# Patient Record
Sex: Male | Born: 1950
Health system: Southern US, Community
[De-identification: ages and names within clinical notes are randomized; demographics above are authoritative.]

## PROBLEM LIST (undated history)

## (undated) DIAGNOSIS — I639 Cerebral infarction, unspecified: Secondary | ICD-10-CM

## (undated) DIAGNOSIS — A048 Other specified bacterial intestinal infections: Secondary | ICD-10-CM

## (undated) DIAGNOSIS — F32A Depression, unspecified: Secondary | ICD-10-CM

## (undated) DIAGNOSIS — M1990 Unspecified osteoarthritis, unspecified site: Secondary | ICD-10-CM

## (undated) DIAGNOSIS — B1920 Unspecified viral hepatitis C without hepatic coma: Secondary | ICD-10-CM

## (undated) DIAGNOSIS — R0602 Shortness of breath: Secondary | ICD-10-CM

## (undated) DIAGNOSIS — K219 Gastro-esophageal reflux disease without esophagitis: Secondary | ICD-10-CM

## (undated) DIAGNOSIS — F329 Major depressive disorder, single episode, unspecified: Secondary | ICD-10-CM

## (undated) DIAGNOSIS — I10 Essential (primary) hypertension: Secondary | ICD-10-CM

## (undated) HISTORY — PX: OTHER SURGICAL HISTORY: SHX169

## (undated) HISTORY — DX: Unspecified osteoarthritis, unspecified site: M19.90

## (undated) HISTORY — DX: Unspecified viral hepatitis C without hepatic coma: B19.20

## (undated) HISTORY — DX: Other specified bacterial intestinal infections: A04.8

## (undated) HISTORY — DX: Gastro-esophageal reflux disease without esophagitis: K21.9

## (undated) HISTORY — DX: Major depressive disorder, single episode, unspecified: F32.9

## (undated) HISTORY — PX: ROTATOR CUFF REPAIR: SHX139

## (undated) HISTORY — DX: Depression, unspecified: F32.A

## (undated) HISTORY — DX: Essential (primary) hypertension: I10

---

## 2002-03-23 ENCOUNTER — Emergency Department (HOSPITAL_COMMUNITY): Admission: EM | Admit: 2002-03-23 | Discharge: 2002-03-23 | Payer: Self-pay | Admitting: Emergency Medicine

## 2002-03-23 ENCOUNTER — Encounter: Payer: Self-pay | Admitting: Emergency Medicine

## 2002-08-29 ENCOUNTER — Emergency Department (HOSPITAL_COMMUNITY): Admission: EM | Admit: 2002-08-29 | Discharge: 2002-08-29 | Payer: Self-pay | Admitting: Emergency Medicine

## 2002-10-04 ENCOUNTER — Encounter: Admission: RE | Admit: 2002-10-04 | Discharge: 2002-10-04 | Payer: Self-pay | Admitting: Internal Medicine

## 2002-11-09 ENCOUNTER — Encounter: Admission: RE | Admit: 2002-11-09 | Discharge: 2002-11-09 | Payer: Self-pay | Admitting: Internal Medicine

## 2002-12-15 ENCOUNTER — Encounter: Admission: RE | Admit: 2002-12-15 | Discharge: 2002-12-15 | Payer: Self-pay | Admitting: Internal Medicine

## 2003-12-13 ENCOUNTER — Ambulatory Visit: Payer: Self-pay | Admitting: Internal Medicine

## 2003-12-17 ENCOUNTER — Emergency Department (HOSPITAL_COMMUNITY): Admission: EM | Admit: 2003-12-17 | Discharge: 2003-12-17 | Payer: Self-pay | Admitting: Family Medicine

## 2003-12-21 ENCOUNTER — Ambulatory Visit (HOSPITAL_COMMUNITY): Admission: RE | Admit: 2003-12-21 | Discharge: 2003-12-21 | Payer: Self-pay | Admitting: Orthopaedic Surgery

## 2004-11-28 ENCOUNTER — Ambulatory Visit: Payer: Self-pay | Admitting: Internal Medicine

## 2004-12-20 ENCOUNTER — Emergency Department (HOSPITAL_COMMUNITY): Admission: EM | Admit: 2004-12-20 | Discharge: 2004-12-20 | Payer: Self-pay | Admitting: Emergency Medicine

## 2005-06-06 ENCOUNTER — Ambulatory Visit: Payer: Self-pay | Admitting: Internal Medicine

## 2005-06-27 ENCOUNTER — Ambulatory Visit: Payer: Self-pay | Admitting: Internal Medicine

## 2005-07-10 ENCOUNTER — Ambulatory Visit: Payer: Self-pay | Admitting: Internal Medicine

## 2006-01-02 DIAGNOSIS — B1710 Acute hepatitis C without hepatic coma: Secondary | ICD-10-CM | POA: Insufficient documentation

## 2006-01-02 DIAGNOSIS — K219 Gastro-esophageal reflux disease without esophagitis: Secondary | ICD-10-CM | POA: Insufficient documentation

## 2006-01-02 DIAGNOSIS — I10 Essential (primary) hypertension: Secondary | ICD-10-CM | POA: Insufficient documentation

## 2006-01-02 DIAGNOSIS — M1990 Unspecified osteoarthritis, unspecified site: Secondary | ICD-10-CM | POA: Insufficient documentation

## 2006-02-24 DIAGNOSIS — R809 Proteinuria, unspecified: Secondary | ICD-10-CM | POA: Insufficient documentation

## 2006-02-24 DIAGNOSIS — F329 Major depressive disorder, single episode, unspecified: Secondary | ICD-10-CM

## 2006-03-16 ENCOUNTER — Telehealth: Payer: Self-pay | Admitting: *Deleted

## 2006-03-24 ENCOUNTER — Ambulatory Visit: Payer: Self-pay | Admitting: Internal Medicine

## 2006-03-24 ENCOUNTER — Encounter (INDEPENDENT_AMBULATORY_CARE_PROVIDER_SITE_OTHER): Payer: Self-pay | Admitting: Infectious Diseases

## 2006-03-24 DIAGNOSIS — J309 Allergic rhinitis, unspecified: Secondary | ICD-10-CM | POA: Insufficient documentation

## 2006-03-24 LAB — CONVERTED CEMR LAB
ALT: 68 units/L — ABNORMAL HIGH (ref 0–53)
AST: 50 units/L — ABNORMAL HIGH (ref 0–37)
Albumin: 4.3 g/dL (ref 3.5–5.2)
Alkaline Phosphatase: 66 units/L (ref 39–117)
BUN: 24 mg/dL — ABNORMAL HIGH (ref 6–23)
CO2: 23 meq/L (ref 19–32)
Calcium: 9.5 mg/dL (ref 8.4–10.5)
Chloride: 103 meq/L (ref 96–112)
Cholesterol: 159 mg/dL (ref 0–200)
Creatinine, Ser: 1.36 mg/dL (ref 0.40–1.50)
Glucose, Bld: 109 mg/dL — ABNORMAL HIGH (ref 70–99)
HCT: 49.9 % (ref 39.0–52.0)
HDL: 36 mg/dL — ABNORMAL LOW (ref >39)
Hemoglobin: 17.5 g/dL — ABNORMAL HIGH (ref 13.0–17.0)
LDL Cholesterol: 97 mg/dL (ref 0–99)
MCHC: 35.1 g/dL (ref 30.0–36.0)
MCV: 89.7 fL (ref 78.0–100.0)
Platelets: 231 10*3/uL (ref 150–400)
Potassium: 3.7 meq/L (ref 3.5–5.3)
RBC: 5.56 M/uL (ref 4.22–5.81)
RDW: 13.3 % (ref 11.5–14.0)
Sodium: 139 meq/L (ref 135–145)
Total Bilirubin: 1 mg/dL (ref 0.3–1.2)
Total CHOL/HDL Ratio: 4.4
Total Protein: 7.6 g/dL (ref 6.0–8.3)
Triglycerides: 130 mg/dL (ref <150)
VLDL: 26 mg/dL (ref 0–40)
WBC: 4.2 10*3/uL (ref 4.0–10.5)

## 2006-04-01 ENCOUNTER — Ambulatory Visit: Payer: Self-pay | Admitting: Internal Medicine

## 2006-04-06 ENCOUNTER — Ambulatory Visit: Payer: Self-pay | Admitting: Hospitalist

## 2006-09-30 ENCOUNTER — Telehealth (INDEPENDENT_AMBULATORY_CARE_PROVIDER_SITE_OTHER): Payer: Self-pay | Admitting: Pharmacy Technician

## 2006-10-19 ENCOUNTER — Ambulatory Visit: Payer: Self-pay | Admitting: Internal Medicine

## 2006-10-20 DIAGNOSIS — F528 Other sexual dysfunction not due to a substance or known physiological condition: Secondary | ICD-10-CM | POA: Insufficient documentation

## 2006-10-23 ENCOUNTER — Encounter (INDEPENDENT_AMBULATORY_CARE_PROVIDER_SITE_OTHER): Payer: Self-pay | Admitting: Infectious Diseases

## 2006-10-23 ENCOUNTER — Ambulatory Visit: Payer: Self-pay | Admitting: Internal Medicine

## 2006-10-23 LAB — CONVERTED CEMR LAB
BUN: 20 mg/dL (ref 6–23)
CO2: 23 meq/L (ref 19–32)
Calcium: 9.2 mg/dL (ref 8.4–10.5)
Chloride: 103 meq/L (ref 96–112)
Cholesterol: 159 mg/dL (ref 0–200)
Creatinine, Ser: 1.44 mg/dL (ref 0.40–1.50)
Glucose, Bld: 96 mg/dL (ref 70–99)
HDL: 40 mg/dL (ref >39)
LDL Cholesterol: 95 mg/dL (ref 0–99)
Potassium: 3.6 meq/L (ref 3.5–5.3)
Sodium: 139 meq/L (ref 135–145)
Total CHOL/HDL Ratio: 4
Triglycerides: 122 mg/dL (ref <150)
VLDL: 24 mg/dL (ref 0–40)

## 2007-04-07 ENCOUNTER — Encounter (INDEPENDENT_AMBULATORY_CARE_PROVIDER_SITE_OTHER): Payer: Self-pay | Admitting: Infectious Diseases

## 2007-04-07 ENCOUNTER — Ambulatory Visit: Payer: Self-pay | Admitting: Internal Medicine

## 2007-04-13 LAB — CONVERTED CEMR LAB
BUN: 18 mg/dL (ref 6–23)
CO2: 25 meq/L (ref 19–32)
Calcium: 9.4 mg/dL (ref 8.4–10.5)
Chloride: 104 meq/L (ref 96–112)
Creatinine, Ser: 1.3 mg/dL (ref 0.40–1.50)
Glucose, Bld: 94 mg/dL (ref 70–99)
Potassium: 3.1 meq/L — ABNORMAL LOW (ref 3.5–5.3)
Sodium: 141 meq/L (ref 135–145)

## 2007-04-14 ENCOUNTER — Telehealth: Payer: Self-pay | Admitting: *Deleted

## 2007-04-15 ENCOUNTER — Telehealth (INDEPENDENT_AMBULATORY_CARE_PROVIDER_SITE_OTHER): Payer: Self-pay | Admitting: Infectious Diseases

## 2007-04-30 ENCOUNTER — Encounter (INDEPENDENT_AMBULATORY_CARE_PROVIDER_SITE_OTHER): Payer: Self-pay | Admitting: Infectious Diseases

## 2007-04-30 ENCOUNTER — Ambulatory Visit: Payer: Self-pay | Admitting: Hospitalist

## 2007-04-30 ENCOUNTER — Telehealth (INDEPENDENT_AMBULATORY_CARE_PROVIDER_SITE_OTHER): Payer: Self-pay | Admitting: Infectious Diseases

## 2007-04-30 LAB — CONVERTED CEMR LAB
BUN: 15 mg/dL (ref 6–23)
CO2: 22 meq/L (ref 19–32)
Calcium: 9.1 mg/dL (ref 8.4–10.5)
Chloride: 104 meq/L (ref 96–112)
Creatinine, Ser: 1.37 mg/dL (ref 0.40–1.50)
Glucose, Bld: 89 mg/dL (ref 70–99)
Potassium: 4.2 meq/L (ref 3.5–5.3)
Sodium: 141 meq/L (ref 135–145)

## 2007-09-28 ENCOUNTER — Ambulatory Visit: Payer: Self-pay | Admitting: *Deleted

## 2007-09-28 ENCOUNTER — Encounter (INDEPENDENT_AMBULATORY_CARE_PROVIDER_SITE_OTHER): Payer: Self-pay | Admitting: *Deleted

## 2007-09-29 ENCOUNTER — Telehealth: Payer: Self-pay | Admitting: *Deleted

## 2007-09-29 ENCOUNTER — Telehealth (INDEPENDENT_AMBULATORY_CARE_PROVIDER_SITE_OTHER): Payer: Self-pay | Admitting: *Deleted

## 2007-09-29 LAB — CONVERTED CEMR LAB
ALT: 45 units/L (ref 0–53)
AST: 40 units/L — ABNORMAL HIGH (ref 0–37)
Albumin: 3.9 g/dL (ref 3.5–5.2)
Alkaline Phosphatase: 56 units/L (ref 39–117)
BUN: 25 mg/dL — ABNORMAL HIGH (ref 6–23)
CO2: 28 meq/L (ref 19–32)
Calcium: 9.4 mg/dL (ref 8.4–10.5)
Chloride: 107 meq/L (ref 96–112)
Creatinine, Ser: 1.54 mg/dL — ABNORMAL HIGH (ref 0.40–1.50)
Glucose, Bld: 102 mg/dL — ABNORMAL HIGH (ref 70–99)
INR: 1 (ref 0.0–1.5)
Potassium: 3.3 meq/L — ABNORMAL LOW (ref 3.5–5.3)
Prothrombin Time: 13.1 s (ref 11.6–15.2)
Sodium: 141 meq/L (ref 135–145)
Total Bilirubin: 1.4 mg/dL — ABNORMAL HIGH (ref 0.3–1.2)
Total Protein: 7.1 g/dL (ref 6.0–8.3)

## 2007-09-30 LAB — CONVERTED CEMR LAB: HCV Quantitative: 3560000 intl units/mL — ABNORMAL HIGH (ref <43)

## 2007-10-27 ENCOUNTER — Encounter (INDEPENDENT_AMBULATORY_CARE_PROVIDER_SITE_OTHER): Payer: Self-pay | Admitting: *Deleted

## 2007-10-27 ENCOUNTER — Ambulatory Visit: Payer: Self-pay | Admitting: Internal Medicine

## 2007-10-27 LAB — CONVERTED CEMR LAB
BUN: 30 mg/dL — ABNORMAL HIGH (ref 6–23)
CO2: 24 meq/L (ref 19–32)
Calcium: 9.5 mg/dL (ref 8.4–10.5)
Chloride: 104 meq/L (ref 96–112)
Creatinine, Ser: 1.55 mg/dL — ABNORMAL HIGH (ref 0.40–1.50)
Glucose, Bld: 96 mg/dL (ref 70–99)
Potassium: 3.6 meq/L (ref 3.5–5.3)
Sodium: 141 meq/L (ref 135–145)

## 2007-10-28 ENCOUNTER — Telehealth (INDEPENDENT_AMBULATORY_CARE_PROVIDER_SITE_OTHER): Payer: Self-pay | Admitting: *Deleted

## 2007-11-05 ENCOUNTER — Telehealth: Payer: Self-pay | Admitting: *Deleted

## 2007-11-11 ENCOUNTER — Ambulatory Visit: Payer: Self-pay | Admitting: Gastroenterology

## 2007-11-25 ENCOUNTER — Ambulatory Visit: Payer: Self-pay | Admitting: Gastroenterology

## 2007-11-25 HISTORY — PX: COLONOSCOPY: SHX174

## 2007-11-25 LAB — HM COLONOSCOPY

## 2007-12-16 ENCOUNTER — Ambulatory Visit: Payer: Self-pay | Admitting: Internal Medicine

## 2007-12-16 ENCOUNTER — Encounter (INDEPENDENT_AMBULATORY_CARE_PROVIDER_SITE_OTHER): Payer: Self-pay | Admitting: *Deleted

## 2007-12-23 DIAGNOSIS — N184 Chronic kidney disease, stage 4 (severe): Secondary | ICD-10-CM | POA: Insufficient documentation

## 2007-12-27 ENCOUNTER — Telehealth (INDEPENDENT_AMBULATORY_CARE_PROVIDER_SITE_OTHER): Payer: Self-pay | Admitting: *Deleted

## 2007-12-27 LAB — CONVERTED CEMR LAB
BUN: 26 mg/dL — ABNORMAL HIGH (ref 6–23)
CO2: 25 meq/L (ref 19–32)
Calcium: 9.5 mg/dL (ref 8.4–10.5)
Chloride: 106 meq/L (ref 96–112)
Creatinine, Ser: 1.76 mg/dL — ABNORMAL HIGH (ref 0.40–1.50)
Creatinine, Urine: 145.6 mg/dL
Glucose, Bld: 123 mg/dL — ABNORMAL HIGH (ref 70–99)
Microalb Creat Ratio: 289.1 mg/g — ABNORMAL HIGH (ref 0.0–30.0)
Microalb, Ur: 42.1 mg/dL — ABNORMAL HIGH (ref 0.00–1.89)
Potassium: 3.4 meq/L — ABNORMAL LOW (ref 3.5–5.3)
Sodium: 139 meq/L (ref 135–145)

## 2007-12-29 ENCOUNTER — Encounter: Payer: Self-pay | Admitting: *Deleted

## 2008-01-04 ENCOUNTER — Ambulatory Visit (HOSPITAL_COMMUNITY): Admission: RE | Admit: 2008-01-04 | Discharge: 2008-01-04 | Payer: Self-pay | Admitting: Internal Medicine

## 2008-01-04 ENCOUNTER — Encounter (INDEPENDENT_AMBULATORY_CARE_PROVIDER_SITE_OTHER): Payer: Self-pay | Admitting: *Deleted

## 2008-01-04 ENCOUNTER — Ambulatory Visit: Payer: Self-pay | Admitting: Internal Medicine

## 2008-03-24 ENCOUNTER — Telehealth: Payer: Self-pay | Admitting: *Deleted

## 2008-04-21 ENCOUNTER — Ambulatory Visit: Payer: Self-pay | Admitting: Internal Medicine

## 2008-04-21 ENCOUNTER — Encounter (INDEPENDENT_AMBULATORY_CARE_PROVIDER_SITE_OTHER): Payer: Self-pay | Admitting: *Deleted

## 2008-04-21 LAB — CONVERTED CEMR LAB
ALT: 72 units/L — ABNORMAL HIGH (ref 0–53)
AST: 55 units/L — ABNORMAL HIGH (ref 0–37)
Albumin: 4.4 g/dL (ref 3.5–5.2)
Alkaline Phosphatase: 69 units/L (ref 39–117)
BUN: 29 mg/dL — ABNORMAL HIGH (ref 6–23)
CO2: 23 meq/L (ref 19–32)
Calcium: 9.4 mg/dL (ref 8.4–10.5)
Chloride: 104 meq/L (ref 96–112)
Creatinine, Ser: 1.45 mg/dL (ref 0.40–1.50)
Glucose, Bld: 87 mg/dL (ref 70–99)
Potassium: 3.6 meq/L (ref 3.5–5.3)
Sodium: 143 meq/L (ref 135–145)
TSH: 1.432 microintl units/mL (ref 0.350–4.50)
Total Bilirubin: 0.7 mg/dL (ref 0.3–1.2)
Total Protein: 7.8 g/dL (ref 6.0–8.3)

## 2008-05-30 ENCOUNTER — Emergency Department (HOSPITAL_COMMUNITY): Admission: EM | Admit: 2008-05-30 | Discharge: 2008-05-30 | Payer: Self-pay | Admitting: Family Medicine

## 2008-05-31 ENCOUNTER — Emergency Department (HOSPITAL_COMMUNITY): Admission: EM | Admit: 2008-05-31 | Discharge: 2008-05-31 | Payer: Self-pay | Admitting: Emergency Medicine

## 2008-06-22 ENCOUNTER — Encounter (INDEPENDENT_AMBULATORY_CARE_PROVIDER_SITE_OTHER): Payer: Self-pay | Admitting: *Deleted

## 2008-06-22 ENCOUNTER — Ambulatory Visit: Payer: Self-pay | Admitting: Gastroenterology

## 2008-07-11 ENCOUNTER — Encounter (INDEPENDENT_AMBULATORY_CARE_PROVIDER_SITE_OTHER): Payer: Self-pay | Admitting: *Deleted

## 2008-07-11 ENCOUNTER — Ambulatory Visit: Payer: Self-pay | Admitting: Internal Medicine

## 2008-07-12 LAB — CONVERTED CEMR LAB
ALT: 62 units/L — ABNORMAL HIGH (ref 0–53)
AST: 51 units/L — ABNORMAL HIGH (ref 0–37)
Albumin: 4.3 g/dL (ref 3.5–5.2)
Alkaline Phosphatase: 72 units/L (ref 39–117)
BUN: 24 mg/dL — ABNORMAL HIGH (ref 6–23)
CO2: 25 meq/L (ref 19–32)
Calcium: 9.5 mg/dL (ref 8.4–10.5)
Chloride: 102 meq/L (ref 96–112)
Creatinine, Ser: 1.57 mg/dL — ABNORMAL HIGH (ref 0.40–1.50)
Creatinine, Urine: 232.7 mg/dL
GFR calc Af Amer: 55 mL/min — ABNORMAL LOW (ref >60)
GFR calc non Af Amer: 45 mL/min — ABNORMAL LOW (ref >60)
Glucose, Bld: 113 mg/dL — ABNORMAL HIGH (ref 70–99)
Microalb Creat Ratio: 654.2 mg/g — ABNORMAL HIGH (ref 0.0–30.0)
Microalb, Ur: 152.24 mg/dL — ABNORMAL HIGH (ref 0.00–1.89)
Potassium: 3.6 meq/L (ref 3.5–5.3)
Sodium: 137 meq/L (ref 135–145)
Total Bilirubin: 0.7 mg/dL (ref 0.3–1.2)
Total Protein: 7.7 g/dL (ref 6.0–8.3)

## 2008-07-21 ENCOUNTER — Ambulatory Visit (HOSPITAL_COMMUNITY): Admission: RE | Admit: 2008-07-21 | Discharge: 2008-07-21 | Payer: Self-pay | Admitting: Gastroenterology

## 2008-07-21 ENCOUNTER — Encounter (INDEPENDENT_AMBULATORY_CARE_PROVIDER_SITE_OTHER): Payer: Self-pay | Admitting: Interventional Radiology

## 2008-08-22 ENCOUNTER — Ambulatory Visit: Payer: Self-pay | Admitting: Gastroenterology

## 2008-08-22 ENCOUNTER — Encounter: Payer: Self-pay | Admitting: Internal Medicine

## 2008-10-10 ENCOUNTER — Ambulatory Visit: Payer: Self-pay | Admitting: Internal Medicine

## 2008-12-11 ENCOUNTER — Telehealth: Payer: Self-pay | Admitting: Internal Medicine

## 2009-01-25 ENCOUNTER — Ambulatory Visit: Payer: Self-pay | Admitting: Internal Medicine

## 2009-01-25 ENCOUNTER — Inpatient Hospital Stay (HOSPITAL_COMMUNITY): Admission: EM | Admit: 2009-01-25 | Discharge: 2009-01-30 | Payer: Self-pay | Admitting: Emergency Medicine

## 2009-01-29 ENCOUNTER — Encounter: Payer: Self-pay | Admitting: Internal Medicine

## 2009-01-30 ENCOUNTER — Encounter: Payer: Self-pay | Admitting: Internal Medicine

## 2009-02-06 ENCOUNTER — Ambulatory Visit: Payer: Self-pay | Admitting: Internal Medicine

## 2009-02-06 LAB — CONVERTED CEMR LAB
BUN: 32 mg/dL — ABNORMAL HIGH (ref 6–23)
CO2: 17 meq/L — ABNORMAL LOW (ref 19–32)
Calcium: 9.3 mg/dL (ref 8.4–10.5)
Chloride: 106 meq/L (ref 96–112)
Creatinine, Ser: 1.45 mg/dL (ref 0.40–1.50)
Glucose, Bld: 94 mg/dL (ref 70–99)
Potassium: 4.8 meq/L (ref 3.5–5.3)
Sodium: 140 meq/L (ref 135–145)

## 2009-03-16 ENCOUNTER — Ambulatory Visit: Payer: Self-pay | Admitting: Internal Medicine

## 2009-05-23 ENCOUNTER — Telehealth: Payer: Self-pay | Admitting: Internal Medicine

## 2009-05-31 ENCOUNTER — Telehealth: Payer: Self-pay | Admitting: Internal Medicine

## 2009-06-25 ENCOUNTER — Emergency Department (HOSPITAL_COMMUNITY): Admission: EM | Admit: 2009-06-25 | Discharge: 2009-06-25 | Payer: Self-pay | Admitting: Family Medicine

## 2009-06-29 ENCOUNTER — Ambulatory Visit: Payer: Self-pay | Admitting: Internal Medicine

## 2009-06-29 DIAGNOSIS — M549 Dorsalgia, unspecified: Secondary | ICD-10-CM | POA: Insufficient documentation

## 2009-09-03 ENCOUNTER — Telehealth: Payer: Self-pay | Admitting: Internal Medicine

## 2009-12-13 ENCOUNTER — Ambulatory Visit: Payer: Self-pay | Admitting: Internal Medicine

## 2009-12-18 ENCOUNTER — Ambulatory Visit: Payer: Self-pay | Admitting: Internal Medicine

## 2009-12-18 LAB — CONVERTED CEMR LAB
ALT: 37 units/L (ref 0–53)
AST: 33 units/L (ref 0–37)
Albumin: 4 g/dL (ref 3.5–5.2)
Alkaline Phosphatase: 72 units/L (ref 39–117)
BUN: 23 mg/dL (ref 6–23)
CO2: 24 meq/L (ref 19–32)
Calcium: 9.4 mg/dL (ref 8.4–10.5)
Chloride: 104 meq/L (ref 96–112)
Cholesterol: 163 mg/dL (ref 0–200)
Creatinine, Ser: 1.43 mg/dL (ref 0.40–1.50)
Glucose, Bld: 127 mg/dL — ABNORMAL HIGH (ref 70–99)
HDL: 34 mg/dL — ABNORMAL LOW (ref >39)
LDL Cholesterol: 105 mg/dL — ABNORMAL HIGH (ref 0–99)
Potassium: 3.7 meq/L (ref 3.5–5.3)
Sodium: 138 meq/L (ref 135–145)
Total Bilirubin: 0.7 mg/dL (ref 0.3–1.2)
Total CHOL/HDL Ratio: 4.8
Total Protein: 7.2 g/dL (ref 6.0–8.3)
Triglycerides: 120 mg/dL (ref <150)
VLDL: 24 mg/dL (ref 0–40)

## 2010-01-25 ENCOUNTER — Telehealth: Payer: Self-pay | Admitting: Internal Medicine

## 2010-03-17 ENCOUNTER — Encounter: Payer: Self-pay | Admitting: *Deleted

## 2010-03-24 LAB — CONVERTED CEMR LAB
Albumin ELP: 54.9 % — ABNORMAL LOW (ref 55.8–66.1)
Alpha-1-Globulin: 3.7 % (ref 2.9–4.9)
Alpha-2-Globulin: 11.4 % (ref 7.1–11.8)
BUN: 28 mg/dL — ABNORMAL HIGH (ref 6–23)
Beta Globulin: 5.8 % (ref 4.7–7.2)
Bilirubin Urine: NEGATIVE
CO2: 20 meq/L (ref 19–32)
Calcium: 9.2 mg/dL (ref 8.4–10.5)
Chloride: 106 meq/L (ref 96–112)
Creatinine, Ser: 1.56 mg/dL — ABNORMAL HIGH (ref 0.40–1.50)
Gamma Globulin: 18.8 % (ref 11.1–18.8)
Glucose, Bld: 112 mg/dL — ABNORMAL HIGH (ref 70–99)
Hemoglobin, Urine: NEGATIVE
Ketones, ur: NEGATIVE mg/dL
Leukocytes, UA: NEGATIVE
Nitrite: NEGATIVE
Potassium: 3.9 meq/L (ref 3.5–5.3)
Protein, ur: 100 mg/dL — AB
RBC / HPF: NONE SEEN (ref <3)
Sodium: 141 meq/L (ref 135–145)
Specific Gravity, Urine: 1.016 (ref 1.005–1.03)
Total Protein, Serum Electrophoresis: 7.8 g/dL (ref 6.0–8.3)
Urine Glucose: NEGATIVE mg/dL
Urobilinogen, UA: 1 (ref 0.0–1.0)
pH: 6.5 (ref 5.0–8.0)

## 2010-03-26 NOTE — Assessment & Plan Note (Signed)
Summary: acute-urgent-care f/u/cfb(vega)/cfb   Vital Signs:  Patient profile:   60 year old male Height:      72 inches (182.88 cm) Weight:      236.5 pounds (107.50 kg) BMI:     32.19 Temp:     97.7 degrees F (36.50 degrees C) oral Pulse rate:   81 / minute BP sitting:   130 / 77  (right arm)  Vitals Entered By: Hilda Blades Ditzler RN (Jun 29, 2009 2:09 PM) Is Patient Diabetic? No Pain Assessment Patient in pain? yes     Location: back Intensity: 4 Type: sharp Onset of pain  since injury Nutritional Status BMI of > 30 = obese Nutritional Status Detail appetite good  Have you ever been in a relationship where you felt threatened, hurt or afraid?denies   Does patient need assistance? Functional Status Self care Ambulation Normal Comments FU from Urgent Care - better. Refills on meds.   Primary Care Provider:  Rudie Meyer MD   History of Present Illness: 60 year old with Past Medical History: GERD Hepatitis C Hypertension Osteoarthritis Depression dyspepsia  h pylori positive-treated   He hurt his back while doing exercise week ago . He is going to [employer]. He is taking hydrocodone, prescribe at urgent care center. He relates that back  pain is better. He needs  refill for his medications.   Depression History:      The patient denies a depressed mood most of the day and a diminished interest in his usual daily activities.         Preventive Screening-Counseling & Management  Alcohol-Tobacco     Alcohol drinks/day: 2     Smoking Status: never  Caffeine-Diet-Exercise     Does Patient Exercise: yes     Type of exercise: WALKING     Times/week: 1-2  Current Medications (verified): 1)  Amlodipine Besylate 10 Mg  Tabs (Amlodipine Besylate) .... Take 1 Tablet By Mouth Once A Day 2)  Hydrochlorothiazide 25 Mg Tabs (Hydrochlorothiazide) .... Take 1 Tablet By Mouth Once A Day 3)  Atenolol 100 Mg  Tabs (Atenolol) .... Take 1 Tablet By Mouth Once A  Day 4)  Accupril 20 Mg Tabs (Quinapril Hcl) .... Take 1 Tablet By Mouth Two Times A Day 5)  Viagra 100 Mg Tabs (Sildenafil Citrate) .... Use As Directed 6)  Zyrtec 5 Mg Tabs (Cetirizine Hcl) .... Take 1 Tablet By Mouth Once A Day 7)  Nexium 40 Mg Cpdr (Esomeprazole Magnesium) .... Take 1 Capsule By Mouth Two Times A Day 8)  Mens Multivitamin Plus   Tabs (Multiple Vitamins-Minerals) .... Take 1 Tablet By Mouth Once A Day  Allergies: 1)  ! Pcn  Review of Systems  The patient denies fever, hoarseness, chest pain, syncope, dyspnea on exertion, peripheral edema, prolonged cough, headaches, hemoptysis, abdominal pain, melena, and hematochezia.    Physical Exam  General:  alert, well-developed, and well-nourished.   Head:  normocephalic, atraumatic, and no abnormalities observed.   Lungs:  normal respiratory effort, no intercostal retractions, no accessory muscle use, and normal breath sounds.   Heart:  normal rate and regular rhythm.   Msk:  normal ROM, no joint swelling, no joint warmth, and no redness over joints.   Extremities:  no edema   Impression & Recommendations:  Problem # 1:  HYPERTENSION (ICD-401.9) His blood pressure is well controlled. I will continue with current regimen. I gave him refill. He had Bmet done at urgent care center on May 2: Cr  at 1.6 , nl K.  His updated medication list for this problem includes:    Amlodipine Besylate 10 Mg Tabs (Amlodipine besylate) .[name]... Take 1 tablet by mouth once a day    Hydrochlorothiazide 25 Mg Tabs (Hydrochlorothiazide) .[name]... Take 1 tablet by mouth once a day    Atenolol 100 Mg Tabs (Atenolol) .[name]... Take 1 tablet by mouth once a day    Accupril 20 Mg Tabs (Quinapril hcl) .[name]... Take 1 tablet by mouth two times a day  BP today: 130/77 Prior BP: 141/84 (03/16/2009)  Labs Reviewed: K+: 4.8 (02/06/2009) Creat: : 1.45 (02/06/2009)   Chol: 159 (10/23/2006)   HDL: 40 (10/23/2006)   LDL: 95 (10/23/2006)   TG: 122 (10/23/2006)  Problem  # 2:  BACK PAIN (ICD-724.5) His back pain is better. Urgent care prescribe lortab for pain # 10. He need morepain medications. He was asking for motrin. I explain to him that she shouldnt take motrin, ibuproen due to his renal failure. I will prescribe tylenol. Neuro exam non focal.  His updated medication list for this problem includes:    Tramadol Hcl 50 Mg Tabs (Tramadol hcl) .[name]... Take 1 tablet every 8 hour as needed for pain.  Problem # 3:  RENAL INSUFFICIENCY (ICD-588.9) Cr at baseline on recent lab done at urgent care center. Cr at 1.6.   Complete Medication List: 1)  Amlodipine Besylate 10 Mg Tabs (Amlodipine besylate) .... Take 1 tablet by mouth once a day 2)  Hydrochlorothiazide 25 Mg Tabs (Hydrochlorothiazide) .... Take 1 tablet by mouth once a day 3)  Atenolol 100 Mg Tabs (Atenolol) .... Take 1 tablet by mouth once a day 4)  Accupril 20 Mg Tabs (Quinapril hcl) .... Take 1 tablet by mouth two times a day 5)  Viagra 100 Mg Tabs (Sildenafil citrate) .... Use as directed 6)  Zyrtec 5 Mg Tabs (Cetirizine hcl) .... Take 1 tablet by mouth once a day 7)  Nexium 40 Mg Cpdr (Esomeprazole magnesium) .... Take 1 capsule by mouth two times a day 8)  Mens Multivitamin Plus Tabs (Multiple vitamins-minerals) .... Take 1 tablet by mouth once a day 9)  Tramadol Hcl 50 Mg Tabs (Tramadol hcl) .... Take 1 tablet every 8 hour as needed for pain.  Patient Instructions: 1)  Please schedule a follow-up appointment in 3 months. Prescriptions: MENS MULTIVITAMIN PLUS   TABS (MULTIPLE VITAMINS-MINERALS) Take 1 tablet by mouth once a day  #31 x 6   Entered and Authorized by:   Niel Hummer MD   Signed by:   Niel Hummer MD on 06/29/2009   Method used:   Print then Give to Patient   RxID:   [id number] VIAGRA 100 MG TABS (SILDENAFIL CITRATE) use as directed  #30 x 1   Entered and Authorized by:   Niel Hummer MD   Signed by:   Niel Hummer MD on 06/29/2009   Method used:   Print then Give  to Patient   RxID:   [id number] NEXIUM 40 MG CPDR (ESOMEPRAZOLE MAGNESIUM) Take 1 capsule by mouth two times a day  #60 x 3   Entered and Authorized by:   Niel Hummer MD   Signed by:   Niel Hummer MD on 06/29/2009   Method used:   Print then Give to Patient   RxID:   [id number] TRAMADOL HCL 50 MG TABS (TRAMADOL HCL) Take 1 tablet every 8 hour as needed for pain.  #40 x 0   Entered and Authorized by:  Niel Hummer MD   Signed by:   Niel Hummer MD on 06/29/2009   Method used:   Print then Give to Patient   RxID:   [id number] ZYRTEC 5 MG TABS (CETIRIZINE HCL) Take 1 tablet by mouth once a day  #31 x 6   Entered and Authorized by:   Niel Hummer MD   Signed by:   Niel Hummer MD on 06/29/2009   Method used:   Print then Give to Patient   RxID:   [id number] ACCUPRIL 20 MG TABS (QUINAPRIL HCL) Take 1 tablet by mouth two times a day  #60 x 3   Entered and Authorized by:   Niel Hummer MD   Signed by:   Niel Hummer MD on 06/29/2009   Method used:   Print then Give to Patient   RxID:   [id number] ATENOLOL 100 MG  TABS (ATENOLOL) Take 1 tablet by mouth once a day  #31 x 6   Entered and Authorized by:   Niel Hummer MD   Signed by:   Niel Hummer MD on 06/29/2009   Method used:   Print then Give to Patient   RxID:   [id number] HYDROCHLOROTHIAZIDE 25 MG TABS (HYDROCHLOROTHIAZIDE) Take 1 tablet by mouth once a day  #31 x 6   Entered and Authorized by:   Niel Hummer MD   Signed by:   Niel Hummer MD on 06/29/2009   Method used:   Print then Give to Patient   RxID:   [id number] AMLODIPINE BESYLATE 10 MG  TABS (AMLODIPINE BESYLATE) Take 1 tablet by mouth once a day  #31 x 6   Entered and Authorized by:   Niel Hummer MD   Signed by:   Niel Hummer MD on 06/29/2009   Method used:   Print then Give to Patient   RxID:   [id number]   Prevention & Chronic Care Immunizations   Influenza vaccine: Not  documented   Influenza vaccine deferral: Refused  (02/06/2009)    Tetanus booster: 02/06/2009: Td    Pneumococcal vaccine: Not documented   Pneumococcal vaccine deferral: Not indicated  (02/06/2009)   Pneumococcal vaccine due: 02/06/2014    H. zoster vaccine: Not documented   H. zoster vaccine deferral: Refused  (02/06/2009)  Colorectal Screening   Hemoccult: Not documented   Hemoccult action/deferral: Refused  (02/06/2009)    Colonoscopy: Location:  [place].    (11/25/2007)   Colonoscopy action/deferral: Refused  (02/06/2009)   Colonoscopy due: 11/2017  Other Screening   PSA: Not documented   PSA action/deferral: Discussion deferred  (02/06/2009)   Smoking status: never  (06/29/2009)  Lipids   Total Cholesterol: 159  (10/23/2006)   LDL: 95  (10/23/2006)   LDL Direct: Not documented   HDL: 40  (10/23/2006)   Triglycerides: 122  (10/23/2006)  Hypertension   Last Blood Pressure: 130 / 77  (06/29/2009)   Serum creatinine: 1.45  (02/06/2009)   Serum potassium 4.8  (02/06/2009)  Self-Management Support :   Personal Goals (by the next clinic visit) :      Personal blood pressure goal: 140/90  (03/16/2009)   Patient will work on the following items until the next clinic visit to reach self-care goals:     Medications and monitoring: take my medicines every day, check my blood pressure, bring all of my medications to every visit, weigh myself weekly  (06/29/2009)     Eating: eat more vegetables, use fresh or frozen vegetables, eat foods that are low in  salt, eat baked foods instead of fried foods, eat fruit for snacks and desserts, limit or avoid alcohol  (06/29/2009)     Activity: take a 30 minute walk every day  (06/29/2009)    Hypertension self-management support: Written self-care plan, Education handout, Resources for patients handout  (06/29/2009)   Hypertension self-care plan printed.   Hypertension education handout printed      Resource handout  printed.

## 2010-03-26 NOTE — Progress Notes (Signed)
Summary: refill/ hla  Phone Note Refill Request Message from:  Fax from Pharmacy on May 23, 2009 12:38 PM  Refills Requested: Medication #1:  NEXIUM 40 MG CPDR Take 1 capsule by mouth two times a day   Last Refilled: 3/16 Initial call taken by: Freddy Finner RN,  May 23, 2009 12:38 PM    Prescriptions: NEXIUM 40 MG CPDR (ESOMEPRAZOLE MAGNESIUM) Take 1 capsule by mouth two times a day  #60 x 5   Entered and Authorized by:   Rudie Meyer MD   Signed by:   Rudie Meyer MD on 05/24/2009   Method used:   Telephoned to ...       [facility] Department (retail)       [address] [place], [place]  [address]       Ph: [phone]       Fax: [phone]   RxID:   [id number]

## 2010-03-26 NOTE — Assessment & Plan Note (Signed)
Summary: EST-CK/FU/MEDS/CFB   Vital Signs:  Patient profile:   60 year old male Height:      72 inches (182.88 cm) Weight:      238.8 pounds (108.55 kg) BMI:     32.50 Temp:     98.4 degrees F (36.89 degrees C) oral Pulse rate:   66 / minute BP sitting:   156 / 86  (right arm)  Vitals Entered By: Hilda Blades Ditzler RN (December 13, 2009 10:41 AM)  Serial Vital Signs/Assessments:  Time      Position  BP       Pulse  Resp  Temp     By 11:05AM             145/82   64                    Debra Ditzler RN  Comments: 11:05AM right arm By: Hilda Blades Ditzler RN   Is Patient Diabetic? No Pain Assessment Patient in pain? no      Nutritional Status BMI of > 30 = obese Nutritional Status Detail appetite good  Have you ever been in a relationship where you felt threatened, hurt or afraid?denies   Does patient need assistance? Functional Status Self care Ambulation Normal Comments Refills on meds.   Primary Care Vishal Sandlin:  Rudie Meyer MD   History of Present Illness: 60 yr old man with pmhx as described below comes to the clinic for follow up. Patient has no complains. Would like refils of his medication.  Patient reports that he went to the [facility] and that they are supposed to call him for an appointment to see if he is a candidate for treatment.   Depression History:      The patient denies a depressed mood most of the day and a diminished interest in his usual daily activities.         Preventive Screening-Counseling & Management  Alcohol-Tobacco     Alcohol drinks/day: 2     Smoking Status: never  Caffeine-Diet-Exercise     Does Patient Exercise: yes     Type of exercise: WALKING     Times/week: 1-2  Problems Prior to Update: 1)  Back Pain  (ICD-724.5) 2)  Renal Insufficiency  (ICD-588.9) 3)  Preventive Health Care  (ICD-V70.0) 4)  Erectile Dysfunction  (ICD-302.72) 5)  Allergic Rhinitis, Chronic  (ICD-477.9) 6)  Proteinuria, Mild   (ICD-791.0) 7)  Hx of Depression  (ICD-311) 8)  Osteoarthritis  (ICD-715.90) 9)  Hypertension  (ICD-401.9) 10)  Hepatitis C  (ICD-070.51) 11)  Gerd  (ICD-530.81)  Medications Prior to Update: 1)  Amlodipine Besylate 10 Mg  Tabs (Amlodipine Besylate) .... Take 1 Tablet By Mouth Once A Day 2)  Hydrochlorothiazide 25 Mg Tabs (Hydrochlorothiazide) .... Take 1 Tablet By Mouth Once A Day 3)  Atenolol 100 Mg  Tabs (Atenolol) .... Take 1 Tablet By Mouth Once A Day 4)  Accupril 20 Mg Tabs (Quinapril Hcl) .... Take 1 Tablet By Mouth Two Times A Day 5)  Viagra 100 Mg Tabs (Sildenafil Citrate) .... Use As Directed 6)  Zyrtec 5 Mg Tabs (Cetirizine Hcl) .... Take 1 Tablet By Mouth Once A Day 7)  Nexium 40 Mg Cpdr (Esomeprazole Magnesium) .... Take 1 Capsule By Mouth Two Times A Day 8)  Mens Multivitamin Plus   Tabs (Multiple Vitamins-Minerals) .... Take 1 Tablet By Mouth Once A Day 9)  Tramadol Hcl 50 Mg Tabs (Tramadol Hcl) .... Take 1 Tablet Every 8  Hour As Needed For Pain.  Current Medications (verified): 1)  Amlodipine Besylate 10 Mg  Tabs (Amlodipine Besylate) .... Take 1 Tablet By Mouth Once A Day 2)  Hydrochlorothiazide 25 Mg Tabs (Hydrochlorothiazide) .... Take 1 Tablet By Mouth Once A Day 3)  Atenolol 100 Mg  Tabs (Atenolol) .... Take 1 Tablet By Mouth Once A Day 4)  Accupril 20 Mg Tabs (Quinapril Hcl) .... Take 1 Tablet By Mouth Two Times A Day 5)  Viagra 100 Mg Tabs (Sildenafil Citrate) .... Use As Directed 6)  Zyrtec 5 Mg Tabs (Cetirizine Hcl) .... Take 1 Tablet By Mouth Once A Day 7)  Nexium 40 Mg Cpdr (Esomeprazole Magnesium) .... Take 1 Capsule By Mouth Two Times A Day 8)  Mens Multivitamin Plus   Tabs (Multiple Vitamins-Minerals) .... Take 1 Tablet By Mouth Once A Day 9)  Tramadol Hcl 50 Mg Tabs (Tramadol Hcl) .... Take 1 Tablet Every 8 Hour As Needed For Pain.  Allergies: 1)  ! Pcn  Past History:  Past Medical History: Last updated: 04/21/2008 GERD Hepatitis  C Hypertension Osteoarthritis Depression dyspepsia  h pylori positive-treated  Past Surgical History: Last updated: 01/02/2006 Rotator cuff repair Left knee surgery  Family History: Last updated: 03/24/2006 Family History Hypertension  Social History: Last updated: 04/21/2008 Single Divorced Never Smoked Alcohol use-no Drug use-none currently. Former [employer]. Was in prison in 1990s.  Risk Factors: Alcohol Use: 2 (12/13/2009) Exercise: yes (12/13/2009)  Risk Factors: Smoking Status: never (12/13/2009)  Family History: Reviewed history from 03/24/2006 and no changes required. Family History Hypertension  Social History: Reviewed history from 04/21/2008 and no changes required. Single Divorced Never Smoked Alcohol use-no Drug use-none currently. Former [employer]. Was in prison in 1990s.  Review of Systems  The patient denies fever, chest pain, dyspnea on exertion, peripheral edema, hemoptysis, abdominal pain, melena, hematochezia, severe indigestion/heartburn, hematuria, muscle weakness, difficulty walking, and unusual weight change.    Physical Exam  General:  alert, well-developed, and well-nourished.   Mouth:  MMM Neck:  supple.   Lungs:  normal respiratory effort, no intercostal retractions, no accessory muscle use, and normal breath sounds.   Heart:  normal rate and regular rhythm.   Abdomen:  soft, non-tender, and normal bowel sounds.   Msk:  normal ROM, no joint swelling, no joint warmth, and no redness over joints.   Extremities:  no edema Neurologic:  alert & oriented X3 and strength normal in all extremities.     Impression & Recommendations:  Problem # 1:  HYPERTENSION (ICD-401.9) Elevated. Recheck blood pressure was 145/85. Prior visit BP was at goal. Will continue current regimen for now.   His updated medication list for this problem includes:    Amlodipine Besylate 10 Mg Tabs (Amlodipine besylate) .[name]... Take 1 tablet by mouth once a day     Hydrochlorothiazide 25 Mg Tabs (Hydrochlorothiazide) .[name]... Take 1 tablet by mouth once a day    Atenolol 100 Mg Tabs (Atenolol) .[name]... Take 1 tablet by mouth once a day    Accupril 20 Mg Tabs (Quinapril hcl) .[name]... Take 1 tablet by mouth two times a day  BP today: 156/86 Prior BP: 130/77 (06/29/2009)  Labs Reviewed: K+: 4.8 (02/06/2009) Creat: : 1.45 (02/06/2009)   Chol: 159 (10/23/2006)   HDL: 40 (10/23/2006)   LDL: 95 (10/23/2006)   TG: 122 (10/23/2006)  Problem # 2:  RENAL INSUFFICIENCY (ICD-588.9) Will check renal function and reassess.  Future Orders: T-CMP with Estimated GFR ([id number]) ... 12/14/2009  Problem # 3:  HEPATITIS C (ICD-070.51) Patient was instructed to call Hepatitis Clinic to make appointment for follow up.  Problem # 4:  SCREENING FOR LIPOID DISORDERS (ICD-V77.91) Review labs and reassess.  Future Orders: T-Lipid Profile [id number]) ... 12/14/2009  Problem # 5:  ERECTILE DYSFUNCTION (ICD-302.72)  His updated medication list for this problem includes:    Viagra 100 Mg Tabs (Sildenafil citrate) ..... Use as directed  Discussed proper use of medications, as well as side effects.   Problem # 6:  PREVENTIVE HEALTH CARE (ICD-V70.0) Patient reports to have had Colonoscopy but no evidence found on Echart. Will inquire about who did Colonoscopy on follow up and get records.   Complete Medication List: 1)  Amlodipine Besylate 10 Mg Tabs (Amlodipine besylate) .... Take 1 tablet by mouth once a day 2)  Hydrochlorothiazide 25 Mg Tabs (Hydrochlorothiazide) .... Take 1 tablet by mouth once a day 3)  Atenolol 100 Mg Tabs (Atenolol) .... Take 1 tablet by mouth once a day 4)  Accupril 20 Mg Tabs (Quinapril hcl) .... Take 1 tablet by mouth two times a day 5)  Viagra 100 Mg Tabs (Sildenafil citrate) .... Use as directed 6)  Zyrtec 5 Mg Tabs (Cetirizine hcl) .... Take 1 tablet by mouth once a day 7)  Nexium 40 Mg Cpdr (Esomeprazole magnesium) .... Take 1 capsule by mouth  two times a day 8)  Mens Multivitamin Plus Tabs (Multiple vitamins-minerals) .... Take 1 tablet by mouth once a day  Patient Instructions: 1)  Please schedule a follow-up appointment in 6 months. 2)  Return to the clinic fasting in the morning for blood work. 3)  Take all medication as directed. Prescriptions: ATENOLOL 100 MG  TABS (ATENOLOL) Take 1 tablet by mouth once a day  #31 x 6   Entered and Authorized by:   Rudie Meyer MD   Signed by:   Rudie Meyer MD on 12/13/2009   Method used:   Print then Give to Patient   RxID:   [id number] HYDROCHLOROTHIAZIDE 25 MG TABS (HYDROCHLOROTHIAZIDE) Take 1 tablet by mouth once a day  #31 x 6   Entered and Authorized by:   Rudie Meyer MD   Signed by:   Rudie Meyer MD on 12/13/2009   Method used:   Print then Give to Patient   RxID:   [id number] NEXIUM 40 MG CPDR (ESOMEPRAZOLE MAGNESIUM) Take 1 capsule by mouth two times a day  #60 x 6   Entered and Authorized by:   Rudie Meyer MD   Signed by:   Rudie Meyer MD on 12/13/2009   Method used:   Faxed to ...       [facility] (retail)       [place], [place]  [address]       Ph: [phone]       Fax: [phone]   RxID:   [id number] VIAGRA 100 MG TABS (SILDENAFIL CITRATE) use as directed  #30 x 1   Entered and Authorized by:   Rudie Meyer MD   Signed by:   Rudie Meyer MD on 12/13/2009   Method used:   Faxed to ...       [facility] (retail)       [address] [place], [place]  [address]       Ph: [phone]       Fax: [phone]   RxID:   [id number] ACCUPRIL 20  MG TABS (QUINAPRIL HCL) Take 1 tablet by mouth two times a day  #60 x 3   Entered and Authorized by:   Rudie Meyer MD   Signed by:   Rudie Meyer MD on 12/13/2009   Method used:   Faxed to ...       [facility] (retail)       [place], [place]  [address]       Ph: [phone]       Fax: [phone]   RxID:   [id number] AMLODIPINE BESYLATE 10 MG  TABS (AMLODIPINE BESYLATE) Take 1 tablet by mouth once a day  #31 x 6   Entered and Authorized by:   Rudie Meyer MD   Signed by:   Rudie Meyer MD on 12/13/2009   Method used:   Faxed to ...       [facility] (retail)       [place],   [address]       Ph: [phone]       Fax: [phone]   RxID:   [id number]    Orders Added: 1)  T-CMP with Estimated GFR [[id number]] 2)  T-Lipid Profile [[id number]] 3)  Est. Patient Level III [id number]   Process Orders Check Orders Results:     [facility]: [id number] not required for this insurance Tests Sent for requisitioning (December 13, 2009 3:16 PM):     12/14/2009: [facility] -- T-CMP with Estimated GFR [[id number]] (signed)     12/14/2009: [facility] -- T-Lipid Profile [id number] (signed)    Prevention & Chronic Care Immunizations   Influenza vaccine: Not documented   Influenza vaccine deferral: Refused  (12/13/2009)    Tetanus booster: 02/06/2009: Td    Pneumococcal vaccine: Not documented   Pneumococcal vaccine deferral: Not indicated  (02/06/2009)   Pneumococcal vaccine due: 02/06/2014    H. zoster vaccine: Not documented   H. zoster vaccine deferral: Refused  (02/06/2009)  Colorectal Screening   Hemoccult: Not documented   Hemoccult action/deferral: Refused  (02/06/2009)    Colonoscopy: Location:  [facility].    (11/25/2007)   Colonoscopy action/deferral: Refused  (02/06/2009)   Colonoscopy due: 11/2017  Other Screening   PSA: Not documented   PSA action/deferral: Discussion deferred  (02/06/2009)   Smoking status: never  (12/13/2009)  Lipids   Total Cholesterol: 159  (10/23/2006)   LDL: 95   (10/23/2006)   LDL Direct: Not documented   HDL: 40  (10/23/2006)   Triglycerides: 122  (10/23/2006)  Hypertension   Last Blood Pressure: 156 / 86  (12/13/2009)   Serum creatinine: 1.45  (02/06/2009)   Serum potassium 4.8  (02/06/2009)    Hypertension flowsheet reviewed?: Yes   Progress toward BP goal: Unchanged  Self-Management Support :   Personal Goals (by the next clinic visit) :      Personal blood pressure goal: 140/90  (03/16/2009)   Patient will work on the following items until the next clinic visit to reach self-care goals:     Medications and monitoring: take my medicines every day, check my blood pressure, bring all of my medications to every visit  (12/13/2009)     Eating: eat more vegetables, eat foods that are low in salt, eat fruit for snacks and desserts  (12/13/2009)     Activity: take a 30 minute walk every day  (12/13/2009)  Hypertension self-management support: Written self-care plan, Education handout, Resources for patients handout  (12/13/2009)   Hypertension self-care plan printed.   Hypertension education handout printed      Resource handout printed.

## 2010-03-26 NOTE — Progress Notes (Signed)
Summary: refill/gg  Phone Note Refill Request  on September 03, 2009 5:16 PM  Refills Requested: Medication #1:  ACCUPRIL 20 MG TABS Take 1 tablet by mouth two times a day   Last Refilled: 06/06/2009  Method Requested: Fax to [facility] Initial call taken by: Gevena Cotton RN,  September 03, 2009 5:16 PM    Prescriptions: ACCUPRIL 20 MG TABS (QUINAPRIL HCL) Take 1 tablet by mouth two times a day  #60 x 3   Entered and Authorized by:   Rudie Meyer MD   Signed by:   Rudie Meyer MD on 09/04/2009   Method used:   Faxed to ...       [facility] Department (retail)       [address] [place], [place]  [address]       Ph: [phone]       Fax: [phone]   RxID:   [id number]

## 2010-03-26 NOTE — Progress Notes (Signed)
Summary: refill/gg  Phone Note Refill Request  on May 31, 2009 4:36 PM  Refills Requested: Medication #1:  AMLODIPINE BESYLATE 10 MG  TABS Take 1 tablet by mouth once a day  Medication #2:  HYDROCHLOROTHIAZIDE 25 MG TABS Take 1 tablet by mouth once a day  Medication #3:  ATENOLOL 100 MG  TABS Take 1 tablet by mouth once a day  Medication #4:  ACCUPRIL 20 MG TABS Take 1 tablet by mouth two times a day  Method Requested: Fax to Local Pharmacy Initial call taken by: Gevena Cotton RN,  May 31, 2009 4:36 PM    Prescriptions: ACCUPRIL 20 MG TABS (QUINAPRIL HCL) Take 1 tablet by mouth two times a day  #60 x 3   Entered and Authorized by:   Rudie Meyer MD   Signed by:   Rudie Meyer MD on 05/31/2009   Method used:   Telephoned to ...       [facility] (retail)       [place], [place]  [address]       Ph: [phone]       Fax: [phone]   RxID:   [id number] ATENOLOL 100 MG  TABS (ATENOLOL) Take 1 tablet by mouth once a day  #31 x 6   Entered and Authorized by:   Rudie Meyer MD   Signed by:   Rudie Meyer MD on 05/31/2009   Method used:   Telephoned to ...       [facility] (retail)       [address] [place] [address], [place]  [address]       Ph: [phone]       Fax: [phone]   RxID:   [id number] HYDROCHLOROTHIAZIDE 25 MG TABS (HYDROCHLOROTHIAZIDE) Take 1 tablet by mouth once a day  #31 x 6   Entered and Authorized by:   Rudie Meyer MD   Signed by:   Rudie Meyer MD on 05/31/2009   Method used:   Telephoned to ...       [facility] (retail)       [place], [place]  [address]       Ph: [phone]       Fax: [phone]   RxID:   [id number] AMLODIPINE BESYLATE 10 MG  TABS (AMLODIPINE BESYLATE) Take 1 tablet by mouth once a day  #31 x 6   Entered and Authorized by:   Rudie Meyer MD   Signed by:   Rudie Meyer MD on 05/31/2009   Method used:   Telephoned to ...       [facility] (retail)       [place], [place]  [address]       Ph: [phone]       Fax: [phone]   RxID:   [id number]   Appended Document: refill/gg rx called in

## 2010-03-26 NOTE — Assessment & Plan Note (Signed)
Summary: 78MONTH F/U/EST/VS   Vital Signs:  Patient profile:   60 year old male Height:      72 inches (182.88 cm) Weight:      232.02 pounds (105.46 kg) BMI:     31.58 O2 Sat:      100 % on Room air Temp:     97.9 degrees F (36.61 degrees C) oral Pulse rate:   73 / minute BP sitting:   141 / 84  (right arm)  Vitals Entered By: Sander Nephew RN (March 16, 2009 9:54 AM)  O2 Flow:  Room air Is Patient Diabetic? No Pain Assessment Patient in pain? no      Nutritional Status BMI of > 30 = obese  Have you ever been in a relationship where you felt threatened, hurt or afraid?No   Does patient need assistance? Functional Status Self care Ambulation Normal Comments Check up.  Needs refills on meds.   Primary Care Provider:  Rudie Meyer MD   History of Present Illness: 60 yr old man with pmhx as described below comes to the clinic for follow up. Patient denies coughing, fever, chills, chest pain, SOB.   Patient would like me to check his ears.   Patient reports that will return to Hepatitis clinic in the summer.   Depression History:      The patient denies a depressed mood most of the day and a diminished interest in his usual daily activities.         Preventive Screening-Counseling & Management  Alcohol-Tobacco     Alcohol drinks/day: 2     Smoking Status: never  Problems Prior to Update: 1)  Pneumonia, Community Acquired, Pneumococcal  (ICD-481) 2)  Subconjunctival Hemorrhage, Left  (ICD-372.72) 3)  Hyperglycemia  (ICD-790.29) 4)  Renal Insufficiency  (ICD-588.9) 5)  Preventive Health Care  (ICD-V70.0) 6)  Hypokalemia  (ICD-276.8) 7)  Erectile Dysfunction  (ICD-302.72) 8)  Allergic Rhinitis, Chronic  (ICD-477.9) 9)  Proteinuria, Mild  (ICD-791.0) 10)  Hx of Depression  (ICD-311) 11)  Osteoarthritis  (ICD-715.90) 12)  Hypertension  (ICD-401.9) 13)  Hepatitis C  (ICD-070.51) 14)  Gerd  (ICD-530.81)  Medications Prior to Update: 1)  Amlodipine  Besylate 10 Mg  Tabs (Amlodipine Besylate) .... Take 1 Tablet By Mouth Once A Day 2)  Hydrochlorothiazide 25 Mg Tabs (Hydrochlorothiazide) .... Take 1 Tablet By Mouth Once A Day 3)  Atenolol 100 Mg  Tabs (Atenolol) .... Take 1 Tablet By Mouth Once A Day 4)  Accupril 20 Mg Tabs (Quinapril Hcl) .... Take 1 Tablet By Mouth Two Times A Day 5)  Viagra 100 Mg Tabs (Sildenafil Citrate) .... Use As Directed 6)  Zyrtec 5 Mg Tabs (Cetirizine Hcl) .... Take 1 Tablet By Mouth Once A Day 7)  Nexium 40 Mg Cpdr (Esomeprazole Magnesium) .... Take 1 Capsule By Mouth Two Times A Day 8)  Mens Multivitamin Plus   Tabs (Multiple Vitamins-Minerals) .... Take 1 Tablet By Mouth Once A Day 9)  Doxycycline Hyclate 100 Mg Caps (Doxycycline Hyclate) .... Take 1 Capsule By Mouth Two Times A Day 10)  Vicodin 5-500 Mg Tabs (Hydrocodone-Acetaminophen) .... Take 1 Tab As Needed Every 4-6 Hours.  Current Medications (verified): 1)  Amlodipine Besylate 10 Mg  Tabs (Amlodipine Besylate) .... Take 1 Tablet By Mouth Once A Day 2)  Hydrochlorothiazide 25 Mg Tabs (Hydrochlorothiazide) .... Take 1 Tablet By Mouth Once A Day 3)  Atenolol 100 Mg  Tabs (Atenolol) .... Take 1 Tablet By Mouth Once A Day  4)  Accupril 20 Mg Tabs (Quinapril Hcl) .... Take 1 Tablet By Mouth Two Times A Day 5)  Viagra 100 Mg Tabs (Sildenafil Citrate) .... Use As Directed 6)  Zyrtec 5 Mg Tabs (Cetirizine Hcl) .... Take 1 Tablet By Mouth Once A Day 7)  Nexium 40 Mg Cpdr (Esomeprazole Magnesium) .... Take 1 Capsule By Mouth Two Times A Day 8)  Mens Multivitamin Plus   Tabs (Multiple Vitamins-Minerals) .... Take 1 Tablet By Mouth Once A Day  Allergies: 1)  ! Pcn  Past History:  Past Medical History: Last updated: 04/21/2008 GERD Hepatitis C Hypertension Osteoarthritis Depression dyspepsia  h pylori positive-treated  Past Surgical History: Last updated: 01/02/2006 Rotator cuff repair Left knee surgery  Family History: Last updated:  03/24/2006 Family History Hypertension  Social History: Last updated: 04/21/2008 Single Divorced Never Smoked Alcohol use-no Drug use-none currently. Former [employer]. Was in prison in 1990s.  Risk Factors: Alcohol Use: 2 (03/16/2009) Exercise: yes (02/06/2009)  Risk Factors: Smoking Status: never (03/16/2009)  Family History: Reviewed history from 03/24/2006 and no changes required. Family History Hypertension  Social History: Reviewed history from 04/21/2008 and no changes required. Single Divorced Never Smoked Alcohol use-no Drug use-none currently. Former [employer]. Was in prison in 1990s.  Review of Systems  The patient denies peripheral edema, headaches, hemoptysis, abdominal pain, melena, hematochezia, hematuria, and muscle weakness.    Physical Exam  General:  NAD Ears:  External ear exam shows no significant lesions or deformities.  Otoscopic examination reveals clear canals, tympanic membranes are intact bilaterally without bulging, retraction, inflammation or discharge.  Mouth:  MMM Neck:  supple.   Lungs:  Normal respiratory effort, chest expands symmetrically. Lungs are clear to auscultation, no crackles or wheezes. Heart:  Normal rate and regular rhythm. S1 and S2 normal without gallop, murmur, click, rub or other extra sounds. Abdomen:  soft, non-tender, and normal bowel sounds.   Msk:  normal ROM.   Extremities:  no edema Neurologic:  alert & oriented X3 and strength normal in all extremities.     Impression & Recommendations:  Problem # 1:  HYPERTENSION (ICD-401.9) Stable. Continue to monitor.  BP today: 141/84 Prior BP: 134/81 (02/06/2009)  Labs Reviewed: K+: 4.8 (02/06/2009) Creat: : 1.45 (02/06/2009)   Chol: 159 (10/23/2006)   HDL: 40 (10/23/2006)   LDL: 95 (10/23/2006)   TG: 122 (10/23/2006)  His updated medication list for this problem includes:    Amlodipine Besylate 10 Mg Tabs (Amlodipine besylate) .[name]... Take 1 tablet by mouth once a day     Hydrochlorothiazide 25 Mg Tabs (Hydrochlorothiazide) .[name]... Take 1 tablet by mouth once a day    Atenolol 100 Mg Tabs (Atenolol) .[name]... Take 1 tablet by mouth once a day    Accupril 20 Mg Tabs (Quinapril hcl) .[name]... Take 1 tablet by mouth two times a day  Problem # 2:  RENAL INSUFFICIENCY (ICD-588.9) Back to baseline. Will recheck bmet in 2 months.  Problem # 3:  HEPATITIS C (ICD-070.51) Patient will follow up with Hepatitis clinic during the summer. Will follow up.  Problem # 4:  GERD (ICD-530.81) Stable. Continue current regimen.  His updated medication list for this problem includes:    Nexium 40 Mg Cpdr (Esomeprazole magnesium) .[name]... Take 1 capsule by mouth two times a day  Complete Medication List: 1)  Amlodipine Besylate 10 Mg Tabs (Amlodipine besylate) .... Take 1 tablet by mouth once a day 2)  Hydrochlorothiazide 25 Mg Tabs (Hydrochlorothiazide) .... Take 1 tablet by mouth once  a day 3)  Atenolol 100 Mg Tabs (Atenolol) .... Take 1 tablet by mouth once a day 4)  Accupril 20 Mg Tabs (Quinapril hcl) .... Take 1 tablet by mouth two times a day 5)  Viagra 100 Mg Tabs (Sildenafil citrate) .... Use as directed 6)  Zyrtec 5 Mg Tabs (Cetirizine hcl) .... Take 1 tablet by mouth once a day 7)  Nexium 40 Mg Cpdr (Esomeprazole magnesium) .... Take 1 capsule by mouth two times a day 8)  Mens Multivitamin Plus Tabs (Multiple vitamins-minerals) .... Take 1 tablet by mouth once a day  Patient Instructions: 1)  Please schedule a follow-up appointment in 2 months recheck blood pressure. 2)  Take all medication as directed. 3)  It is important that you exercise regularly at least 20 minutes 5 times a week. If you develop chest pain, have severe difficulty breathing, or feel very tired , stop exercising immediately and seek medical attention. 4)  Check your Blood Pressure regularly. If it is above: you should make an appointment.  Prevention & Chronic Care Immunizations   Influenza vaccine: Not  documented   Influenza vaccine deferral: Refused  (02/06/2009)    Tetanus booster: 02/06/2009: Td    Pneumococcal vaccine: Not documented   Pneumococcal vaccine deferral: Not indicated  (02/06/2009)   Pneumococcal vaccine due: 02/06/2014    H. zoster vaccine: Not documented   H. zoster vaccine deferral: Refused  (02/06/2009)  Colorectal Screening   Hemoccult: Not documented   Hemoccult action/deferral: Refused  (02/06/2009)    Colonoscopy: Location:  [facility].    (11/25/2007)   Colonoscopy action/deferral: Refused  (02/06/2009)   Colonoscopy due: 11/2017  Other Screening   PSA: Not documented   PSA action/deferral: Discussion deferred  (02/06/2009)   Smoking status: never  (03/16/2009)  Lipids   Total Cholesterol: 159  (10/23/2006)   LDL: 95  (10/23/2006)   LDL Direct: Not documented   HDL: 40  (10/23/2006)   Triglycerides: 122  (10/23/2006)  Hypertension   Last Blood Pressure: 141 / 84  (03/16/2009)   Serum creatinine: 1.45  (02/06/2009)   Serum potassium 4.8  (02/06/2009)    Hypertension flowsheet reviewed?: Yes   Progress toward BP goal: Deteriorated  Self-Management Support :   Personal Goals (by the next clinic visit) :      Personal blood pressure goal: 140/90  (03/16/2009)   Patient will work on the following items until the next clinic visit to reach self-care goals:     Medications and monitoring: take my medicines every day, check my blood pressure, bring all of my medications to every visit  (03/16/2009)     Eating: drink diet soda or water instead of juice or soda, eat more vegetables, eat foods that are low in salt  (03/16/2009)     Activity: take a 30 minute walk every day  (03/16/2009)    Hypertension self-management support: Written self-care plan  (03/16/2009)   Hypertension self-care plan printed.   Nursing Instructions: recheck blood pressure

## 2010-03-26 NOTE — Progress Notes (Signed)
Summary: refill/gg  Phone Note Refill Request  on January 25, 2010 4:25 PM  Refills Requested: Medication #1:  ZYRTEC 5 MG TABS Take 1 tablet by mouth once a day ***[facility] request to change zyrtec to clarinex and get through [facility].  Will you change?   Method Requested: Fax to [facility] Initial call taken by: Gevena Cotton RN,  January 25, 2010 4:25 PM    New/Updated Medications: CLARINEX 5 MG TABS (DESLORATADINE) Take 1 tablet by mouth once a day Prescriptions: CLARINEX 5 MG TABS (DESLORATADINE) Take 1 tablet by mouth once a day  #30 x 3   Entered and Authorized by:   Rudie Meyer MD   Signed by:   Rudie Meyer MD on 01/25/2010   Method used:   Faxed to ...       [facility] (retail)       [address] [place],   [address]       Ph: [phone]       Fax: [phone]   RxID:   [id number]

## 2010-05-14 LAB — POCT I-STAT, CHEM 8
Calcium, Ion: 1.18 mmol/L (ref 1.12–1.32)
Chloride: 107 mEq/L (ref 96–112)
Glucose, Bld: 102 mg/dL — ABNORMAL HIGH (ref 70–99)
HCT: 51 % (ref 39.0–52.0)
Hemoglobin: 17.3 g/dL — ABNORMAL HIGH (ref 13.0–17.0)
Potassium: 4.1 mEq/L (ref 3.5–5.1)

## 2010-05-20 ENCOUNTER — Telehealth: Payer: Self-pay | Admitting: *Deleted

## 2010-05-20 NOTE — Telephone Encounter (Signed)
Received call from pt's wife, stating pt has appointment this Friday. Pt wife wants him to stop getting viagra. She states he has been having chest pain when he takes the pills. She also states he has a Hx of fondling children but was not charged with this offence. She thinks having the viagra might  cause this to happen again. She does NOT want him to be aware of this call.

## 2010-05-21 NOTE — Telephone Encounter (Signed)
Dr Wendee Beavers aware and will see pt friday

## 2010-05-24 ENCOUNTER — Encounter: Payer: Self-pay | Admitting: Internal Medicine

## 2010-05-24 ENCOUNTER — Ambulatory Visit (INDEPENDENT_AMBULATORY_CARE_PROVIDER_SITE_OTHER): Payer: Self-pay | Admitting: Internal Medicine

## 2010-05-24 DIAGNOSIS — M7712 Lateral epicondylitis, left elbow: Secondary | ICD-10-CM | POA: Insufficient documentation

## 2010-05-24 DIAGNOSIS — F528 Other sexual dysfunction not due to a substance or known physiological condition: Secondary | ICD-10-CM

## 2010-05-24 DIAGNOSIS — I10 Essential (primary) hypertension: Secondary | ICD-10-CM

## 2010-05-24 DIAGNOSIS — M7700 Medial epicondylitis, unspecified elbow: Secondary | ICD-10-CM

## 2010-05-24 DIAGNOSIS — M7702 Medial epicondylitis, left elbow: Secondary | ICD-10-CM

## 2010-05-24 DIAGNOSIS — N259 Disorder resulting from impaired renal tubular function, unspecified: Secondary | ICD-10-CM

## 2010-05-24 DIAGNOSIS — K219 Gastro-esophageal reflux disease without esophagitis: Secondary | ICD-10-CM

## 2010-05-24 LAB — BASIC METABOLIC PANEL
Calcium: 9.2 mg/dL (ref 8.4–10.5)
Creat: 1.65 mg/dL — ABNORMAL HIGH (ref 0.40–1.50)
Sodium: 142 mEq/L (ref 135–145)

## 2010-05-24 MED ORDER — QUINAPRIL HCL 20 MG PO TABS: 20.0000 mg | ORAL_TABLET | Freq: Two times a day (BID) | ORAL | Status: DC

## 2010-05-24 MED ORDER — ESOMEPRAZOLE MAGNESIUM 40 MG PO CPDR: 40.0000 mg | DELAYED_RELEASE_CAPSULE | Freq: Every day | ORAL | Status: DC

## 2010-05-24 MED ORDER — HYDROCHLOROTHIAZIDE 25 MG PO TABS: 25.0000 mg | ORAL_TABLET | Freq: Every day | ORAL | Status: DC

## 2010-05-24 MED ORDER — IBUPROFEN 200 MG PO TABS: 400.0000 mg | ORAL_TABLET | Freq: Two times a day (BID) | ORAL | Status: AC

## 2010-05-24 MED ORDER — SILDENAFIL CITRATE 100 MG PO TABS: 100.0000 mg | ORAL_TABLET | ORAL | Status: DC

## 2010-05-24 MED ORDER — ATENOLOL 100 MG PO TABS: 100.0000 mg | ORAL_TABLET | Freq: Every day | ORAL | Status: DC

## 2010-05-24 MED ORDER — AMLODIPINE BESYLATE 10 MG PO TABS: 10.0000 mg | ORAL_TABLET | Freq: Every day | ORAL | Status: DC

## 2010-05-24 NOTE — Progress Notes (Signed)
  Subjective:    Patient ID: Edward Morgan, male    DOB: 11-09-1950, 60 y.o.   MRN: [id number]  HPI  60 yo man with  Past Medical History  Diagnosis Date  . Depression   . Hypertension   . 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  [name] GERD (gastroesophageal reflux disease)   . Arthritis   . H. pylori infection     history of   comes to the clinic complaining of forearm pain for the last 2-3 months. Radiates from elbow. Described as sharp. Progressively gotten worse. Constant. Aggravated with cold temperature. Reports now to be having some grip weakness. Alleviated with tramadol. Denies fever/chills, chest pain, shortness of breath,   Review of Systems  All other systems reviewed and are negative.       Objective:   Physical Exam  Constitutional: He is oriented to person, place, and time. He appears well-developed and well-nourished. No distress.  HENT:  Mouth/Throat: Oropharynx is clear and moist.  Eyes: Conjunctivae and EOM are normal. Pupils are equal, round, and reactive to light.  Neck: Normal range of motion. Neck supple.  Cardiovascular: Normal rate, regular rhythm and normal heart sounds.   Pulmonary/Chest: Effort normal and breath sounds normal.  Abdominal: Soft. Bowel sounds are normal.  Musculoskeletal: Normal range of motion. He exhibits tenderness. He exhibits no edema.       ttp lateral epicondial, wrist pain with extension on resistance  5/5 strength throughout  Neurological: He is alert and oriented to person, place, and time.  Skin: He is not diaphoretic.  Psychiatric: He has a normal mood and affect.          Assessment & Plan:

## 2010-05-24 NOTE — Assessment & Plan Note (Addendum)
At goal. Continue current regimen. Check renal function today. Refilled medication.

## 2010-05-24 NOTE — Assessment & Plan Note (Signed)
Stable. Refilled medication.  

## 2010-05-24 NOTE — Assessment & Plan Note (Addendum)
Instructed to take low dose NSAID for one week then stop, as NSAIDs should be limited due to renal insufficiency. Referral to Sports Medicine ordered. Will follow up.

## 2010-05-24 NOTE — Patient Instructions (Signed)
Make a follow up appointment in 1 month. Take all medication as directed.

## 2010-05-24 NOTE — Assessment & Plan Note (Signed)
Stable. Continue current regimen. Refilled medication.

## 2010-05-24 NOTE — Assessment & Plan Note (Signed)
Check renal function today

## 2010-05-28 LAB — BASIC METABOLIC PANEL
BUN: 16 mg/dL (ref 6–23)
BUN: 19 mg/dL (ref 6–23)
BUN: 23 mg/dL (ref 6–23)
BUN: 25 mg/dL — ABNORMAL HIGH (ref 6–23)
CO2: 20 mEq/L (ref 19–32)
CO2: 20 mEq/L (ref 19–32)
CO2: 32 mEq/L (ref 19–32)
Calcium: 8.5 mg/dL (ref 8.4–10.5)
Calcium: 8.9 mg/dL (ref 8.4–10.5)
Chloride: 105 mEq/L (ref 96–112)
Chloride: 106 mEq/L (ref 96–112)
Creatinine, Ser: 1.42 mg/dL (ref 0.4–1.5)
Creatinine, Ser: 1.48 mg/dL (ref 0.4–1.5)
Creatinine, Ser: 1.75 mg/dL — ABNORMAL HIGH (ref 0.4–1.5)
Creatinine, Ser: 2.75 mg/dL — ABNORMAL HIGH (ref 0.4–1.5)
GFR calc Af Amer: 29 mL/min — ABNORMAL LOW (ref >60)
GFR calc Af Amer: 45 mL/min — ABNORMAL LOW (ref >60)
GFR calc Af Amer: 53 mL/min — ABNORMAL LOW (ref >60)
GFR calc Af Amer: 59 mL/min — ABNORMAL LOW (ref >60)
GFR calc non Af Amer: 24 mL/min — ABNORMAL LOW (ref >60)
GFR calc non Af Amer: 37 mL/min — ABNORMAL LOW (ref >60)
GFR calc non Af Amer: 40 mL/min — ABNORMAL LOW (ref >60)
GFR calc non Af Amer: 51 mL/min — ABNORMAL LOW (ref >60)
Glucose, Bld: 135 mg/dL — ABNORMAL HIGH (ref 70–99)
Glucose, Bld: 97 mg/dL (ref 70–99)
Glucose, Bld: 97 mg/dL (ref 70–99)
Potassium: 3 mEq/L — ABNORMAL LOW (ref 3.5–5.1)
Potassium: 3.4 mEq/L — ABNORMAL LOW (ref 3.5–5.1)
Potassium: 3.5 mEq/L (ref 3.5–5.1)
Potassium: 4 mEq/L (ref 3.5–5.1)
Sodium: 134 mEq/L — ABNORMAL LOW (ref 135–145)
Sodium: 137 mEq/L (ref 135–145)
Sodium: 139 mEq/L (ref 135–145)

## 2010-05-28 LAB — LIPID PANEL
HDL: <10 mg/dL — ABNORMAL LOW (ref >39)
VLDL: 36 mg/dL (ref 0–40)

## 2010-05-28 LAB — CBC
HCT: 37 % — ABNORMAL LOW (ref 39.0–52.0)
HCT: 38.8 % — ABNORMAL LOW (ref 39.0–52.0)
Hemoglobin: 12.9 g/dL — ABNORMAL LOW (ref 13.0–17.0)
Hemoglobin: 13.3 g/dL (ref 13.0–17.0)
MCHC: 34.5 g/dL (ref 30.0–36.0)
MCHC: 34.6 g/dL (ref 30.0–36.0)
MCV: 93.2 fL (ref 78.0–100.0)
MCV: 93.4 fL (ref 78.0–100.0)
Platelets: 140 10*3/uL — ABNORMAL LOW (ref 150–400)
Platelets: 177 10*3/uL (ref 150–400)
RBC: 3.97 MIL/uL — ABNORMAL LOW (ref 4.22–5.81)
RBC: 4.02 MIL/uL — ABNORMAL LOW (ref 4.22–5.81)
RBC: 4.12 MIL/uL — ABNORMAL LOW (ref 4.22–5.81)
RBC: 4.24 MIL/uL (ref 4.22–5.81)
RDW: 12.9 % (ref 11.5–15.5)
RDW: 13.3 % (ref 11.5–15.5)
WBC: 8.3 10*3/uL (ref 4.0–10.5)
WBC: 8.9 10*3/uL (ref 4.0–10.5)
WBC: 9.3 10*3/uL (ref 4.0–10.5)

## 2010-05-28 LAB — HEPATIC FUNCTION PANEL
ALT: 58 U/L — ABNORMAL HIGH (ref 0–53)
AST: 84 U/L — ABNORMAL HIGH (ref 0–37)
Albumin: 2.4 g/dL — ABNORMAL LOW (ref 3.5–5.2)
Alkaline Phosphatase: 70 U/L (ref 39–117)
Total Bilirubin: 2.9 mg/dL — ABNORMAL HIGH (ref 0.3–1.2)
Total Protein: 6.5 g/dL (ref 6.0–8.3)

## 2010-05-28 LAB — CULTURE, RESPIRATORY W GRAM STAIN
Culture: NORMAL
Gram Stain: NONE SEEN

## 2010-05-28 LAB — RAPID URINE DRUG SCREEN, HOSP PERFORMED
Amphetamines: NOT DETECTED
Cocaine: NOT DETECTED
Opiates: POSITIVE — AB
Tetrahydrocannabinol: NOT DETECTED

## 2010-05-28 LAB — PROTIME-INR: Prothrombin Time: 15 seconds (ref 11.6–15.2)

## 2010-05-28 LAB — LEGIONELLA ANTIGEN, URINE

## 2010-05-28 LAB — CREATININE, URINE, RANDOM: Creatinine, Urine: 70.1 mg/dL

## 2010-05-28 LAB — CULTURE, BLOOD (ROUTINE X 2): Culture: NO GROWTH

## 2010-05-28 LAB — HCV RNA QUANT
HCV Quantitative Log: 6.8 {Log} — ABNORMAL HIGH (ref <1.63)
HCV Quantitative: 6320000 IU/mL — ABNORMAL HIGH (ref <43)

## 2010-05-28 LAB — ANTI-NEUTROPHIL ANTIBODY

## 2010-05-28 LAB — EXPECTORATED SPUTUM ASSESSMENT W GRAM STAIN, RFLX TO RESP C

## 2010-05-28 LAB — ETHANOL: Alcohol, Ethyl (B): <5 mg/dL (ref 0–10)

## 2010-06-03 ENCOUNTER — Ambulatory Visit: Payer: Self-pay | Admitting: Family Medicine

## 2010-06-04 LAB — CBC
Hemoglobin: 15 g/dL (ref 13.0–17.0)
Platelets: 181 10*3/uL (ref 150–400)
RDW: 12.8 % (ref 11.5–15.5)

## 2010-06-04 LAB — PROTIME-INR: Prothrombin Time: 13.2 seconds (ref 11.6–15.2)

## 2010-06-05 LAB — POCT CARDIAC MARKERS
CKMB, poc: 1.3 ng/mL (ref 1.0–8.0)
Myoglobin, poc: 120 ng/mL (ref 12–200)
Troponin i, poc: <0.05 ng/mL (ref 0.00–0.09)

## 2010-06-05 LAB — POCT I-STAT, CHEM 8
Chloride: 105 mEq/L (ref 96–112)
Creatinine, Ser: 2.3 mg/dL — ABNORMAL HIGH (ref 0.4–1.5)
Glucose, Bld: 105 mg/dL — ABNORMAL HIGH (ref 70–99)
HCT: 45 % (ref 39.0–52.0)
Hemoglobin: 15.3 g/dL (ref 13.0–17.0)
Potassium: 3.2 mEq/L — ABNORMAL LOW (ref 3.5–5.1)
Sodium: 137 mEq/L (ref 135–145)

## 2010-06-07 ENCOUNTER — Ambulatory Visit (INDEPENDENT_AMBULATORY_CARE_PROVIDER_SITE_OTHER): Payer: Self-pay | Admitting: Family Medicine

## 2010-06-07 DIAGNOSIS — M25529 Pain in unspecified elbow: Secondary | ICD-10-CM

## 2010-06-07 DIAGNOSIS — M7710 Lateral epicondylitis, unspecified elbow: Secondary | ICD-10-CM

## 2010-06-07 DIAGNOSIS — M25522 Pain in left elbow: Secondary | ICD-10-CM | POA: Insufficient documentation

## 2010-06-07 DIAGNOSIS — M7712 Lateral epicondylitis, left elbow: Secondary | ICD-10-CM

## 2010-06-07 NOTE — Assessment & Plan Note (Addendum)
-   Left elbow pain secondary to lateral epicondylitis, did appreciate small avulsion fleck on [facility] ultrasound was surrounding edema today. No signs of extensor tendon tear. - Pain has been increasing over the past 3 months, patient is unable to take NSAIDs on a regular basis due to renal insufficiency, therefore recommended corticosteroid injection which he underwent in office today. PROCEDURE  NOTE: Consent obtained and verified. Sterile betadine prep. Furthur cleansed with alcohol. Topical analgesic spray: Ethyl chloride. Joint: Left lateral epicondyle Approached in typical fashion with: Lateral approach Completed without difficulty Meds: 3 cc 1% lidocaine + 1 cc Kenalog 40 mg/mL Needle: 25-gauge 1.5 inch Aftercare instructions and Red flags advised. Patient tolerated procedure well without complications. - Was fitted with counterforce brace to wear with activities. - Instructed on home exercises and stretches to do 2-3 times a day - Follow up with [place] in 6 weeks if no improvement, otherwise as needed

## 2010-06-07 NOTE — Progress Notes (Signed)
Subjective:    Patient ID: Edward Morgan, male    DOB: 07-24-1950, 60 y.o.   MRN: [id number]  HPI 60 year old right-hand-dominant male referred to the office for evaluation of left elbow pain. Pain has been ongoing for the past 3 months, located on the lateral elbow radiating into the distal forearm. He has noted decreased grip strength and increasing pain with gripping items. Pain has been relatively constant and worsening in intensity. He denies any injury or trauma. 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 PCP had started him on a brief course of Advil, but were trying to limit this due to history of renal insufficiency. Patient denies any previous elbow injuries. Denies any numbness or tingling.  Past Medical History  Diagnosis Date  . Depression   . Hypertension   . 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  [name] GERD (gastroesophageal reflux disease)   . Arthritis   . H. pylori infection     history of   Past Surgical History  Procedure Date  . Rotator cuff repair   . Closed reduction, percutaneous pinning of right 5th finger,   . Left knee surgery   . Colonoscopy 10/09    by [facility]: showed sigmoid Diverticulosis   Allergies  Allergen Reactions  . Penicillins     REACTION: tongue swells   Current outpatient prescriptions:amLODipine (NORVASC) 10 MG tablet, Take 1 tablet (10 mg total) by mouth daily., Disp: 30 tablet, Rfl: 6;  atenolol (TENORMIN) 100 MG tablet, Take 1 tablet (100 mg total) by mouth daily., Disp: 30 tablet, Rfl: 6;  desloratadine (CLARINEX) 5 MG tablet, Take 5 mg by mouth daily.  , Disp: , Rfl: ;  esomeprazole (NEXIUM) 40 MG capsule, Take 1 capsule (40 mg total) by mouth daily before breakfast., Disp: 30 capsule, Rfl: 6 hydrochlorothiazide 25 MG tablet, Take 1 tablet (25 mg total) by mouth daily.,  Disp: 30 tablet, Rfl: 6;  Multiple Vitamins-Minerals (MENS MULTI VITAMIN & MINERAL) TABS, Take 1 tablet by mouth daily.  , Disp: , Rfl: ;  quinapril (ACCUPRIL) 20 MG tablet, Take 1 tablet (20 mg total) by mouth 2 (two) times daily., Disp: 60 tablet, Rfl: 6;  sildenafil (VIAGRA) 100 MG tablet, Take 1 tablet (100 mg total) by mouth as directed., Disp: 10 tablet, Rfl: 3  History   Social History  . Marital Status: Single    Spouse Name: N/A    Number of Children: N/A  . Years of Education: N/A   Occupational History  . Not on file.   Social History Main Topics  . Smoking status: Never Smoker   . Smokeless tobacco: Not on file  . Alcohol Use: Not on file  . Drug Use: Not on file  . Sexually Active: Not on file   Other Topics Concern  . Not on file   Social History Narrative   Financial assistance approved for 100% discount at [facility] and has [place]  March 12, 2010 5:55 PM    Review of Systems Per HPI, otherwise review of systems negative    Objective:   Physical Exam GENERAL: Alert and oriented x3, no acute distress, pleasant SKIN: No rashes or lesions RESPIRATIONS: 15 and unlabored MSK: - Left elbow: Full range of motion without pain, no effusion or significant soft tissue swelling. Exquisitely  tender to palpation over the lateral epicondyles and extensor bundle, no tenderness over olecranon or medial epicondyle. Increased pain with resisted wrist extension, resisted third finger extension, and with blood test. No pain with resisted wrist flexion. He has normal grip strength. No atrophy appreciated. - Right elbow: Full range of motion without pain, tenderness, weakness, instability. NEURO: Sensation intact to light touch VASCULAR: Pulses +2/4 radial artery bilaterally and normal cap refill.  MSK ultrasound: Left elbow- lateral upper condyle well-visualized, has small calcification with surrounding hypoechoic area consistent with small avulsion fleck with  surrounding edema. Increased Doppler flow is noted in this area. Normal-appearing extensor tendon without any signs of tearing. Images saved      Assessment & Plan:

## 2010-06-07 NOTE — Assessment & Plan Note (Signed)
Left elbow lateral epicondylitis with small avulsion fleck of lateral upper condyle appreciated on MSK ultrasound with surrounding edema. Conducted corticosteroid injection in office today. Remainder of plan as stated above.

## 2010-07-12 ENCOUNTER — Encounter: Payer: Self-pay | Admitting: Internal Medicine

## 2010-07-12 NOTE — Op Note (Signed)
NAME:  Halbur, Quincy               ACCOUNT NO.:  [id number]   MEDICAL RECORD NO.:  [id number]          PATIENT TYPE:  OIB   LOCATION:  2889                         FACILITY:  [facility]   PHYSICIAN:  Lind Guest. Ninfa Linden, M.D.DATE OF BIRTH:  04-19-1950   DATE OF PROCEDURE:  12/21/2003  DATE OF DISCHARGE:  12/21/2003                                 OPERATIVE REPORT   PREOPERATIVE DIAGNOSIS:  Right 5th finger proximal phalanx fracture.   POSTOPERATIVE DIAGNOSIS:  Right 5th finger proximal phalanx fracture.   PROCEDURE:  Closed reduction, percutaneous pinning of right 5th finger,  proximal phalanx fracture.   SURGEON:  Lind Guest. Ninfa Linden, M.D.   ANESTHESIA:  General.   ESTIMATED BLOOD LOSS:  Minimal.   TOURNIQUET TIME:  None.   FLUIDS:  Less than 1000 cc.   COMPLICATIONS:  None.   INDICATIONS FOR PROCEDURE:  Mr. Berretta is a pleasant, 60 year old right-hand  dominant male who hurt his finger while playing football with his grandkids.  He was seen in the clinic and found to have a displaced, transverse fracture  of the proximal phalanx of the 5th finger on his right hand. It was  recommended he undergo pinning of this fracture due to the inherent  instability of the transverse fracture.  He agreed to proceed with surgery.   DESCRIPTION OF PROCEDURE:  After informed consent was obtained, Mr. Coppedge  was brought to the operating room, placed supine on the operating table.  General anesthesia was obtained.  Tourniquet was placed around his right arm  but it was never used during the case.  His right arm was then prepped and  draped with sterile drapes and DuraPrep.   Attention was turned to the 5th digit.  Under fluoroscopic guidance, the  fracture was held in a reduced position and two 0.45 mm Kirschner wires were  placed across the fracture.  These were done in a crossing type of pattern.  The fracture was held in a reduced position.  It was assessed  fluoroscopically and  found to be reduced.  The pins were then cut on the  outside of the skin and caps were placed over the pins.  Xeroform dressing  was then applied,  followed by a well-padded splint, with the wrist dorsiflexed and the MCPs  over the 4th and 5th digits flexed.  The fingers continued to remain pink.  The patient was awakened, extubated, taken to the recovery room in stable  condition.       CYB/MEDQ  D:  01/28/2004  T:  01/28/2004  Job:  [id number]

## 2010-08-19 ENCOUNTER — Telehealth: Payer: Self-pay | Admitting: *Deleted

## 2010-08-19 ENCOUNTER — Ambulatory Visit: Payer: Self-pay | Admitting: Family Medicine

## 2010-08-19 NOTE — Telephone Encounter (Signed)
Pt calls c/o diarrhea x 6 days, now is pos for sm amt of blood. Decreased appetite, taking sm amts of po fluids. Has not checked temp. Denies vomiting. Denies being in known contact w/ others that are sick. Denies weakness. Has not tried anything to alleviate, eating aggravates. appt given for 6/27 at 0945. Is cautioned to stay w/ brat type diet. Seek care at [facility] care or ED if becomes worse- bleeding actively in stool, increased abd pain or is not comfortable waiting for appt.

## 2010-08-19 NOTE — Telephone Encounter (Signed)
Noted. Agree.

## 2010-08-20 ENCOUNTER — Encounter: Payer: Self-pay | Admitting: Internal Medicine

## 2010-08-20 ENCOUNTER — Ambulatory Visit (INDEPENDENT_AMBULATORY_CARE_PROVIDER_SITE_OTHER): Payer: Self-pay | Admitting: Internal Medicine

## 2010-08-20 ENCOUNTER — Telehealth: Payer: Self-pay | Admitting: *Deleted

## 2010-08-20 VITALS — BP 134/86 | HR 61 | Temp 97.8°F | Ht 72.0 in | Wt 232.5 lb

## 2010-08-20 DIAGNOSIS — R197 Diarrhea, unspecified: Secondary | ICD-10-CM | POA: Insufficient documentation

## 2010-08-20 DIAGNOSIS — K219 Gastro-esophageal reflux disease without esophagitis: Secondary | ICD-10-CM

## 2010-08-20 DIAGNOSIS — N259 Disorder resulting from impaired renal tubular function, unspecified: Secondary | ICD-10-CM

## 2010-08-20 DIAGNOSIS — M1990 Unspecified osteoarthritis, unspecified site: Secondary | ICD-10-CM

## 2010-08-20 DIAGNOSIS — I10 Essential (primary) hypertension: Secondary | ICD-10-CM

## 2010-08-20 MED ORDER — LOPERAMIDE HCL 2 MG PO TABS: 2.0000 mg | ORAL_TABLET | Freq: Four times a day (QID) | ORAL | Status: AC | PRN

## 2010-08-20 MED ORDER — QUINAPRIL HCL 20 MG PO TABS: 20.0000 mg | ORAL_TABLET | Freq: Two times a day (BID) | ORAL | Status: DC

## 2010-08-20 MED ORDER — HYDROCHLOROTHIAZIDE 25 MG PO TABS: 25.0000 mg | ORAL_TABLET | Freq: Every day | ORAL | Status: DC

## 2010-08-20 MED ORDER — AMLODIPINE BESYLATE 10 MG PO TABS: 10.0000 mg | ORAL_TABLET | Freq: Every day | ORAL | Status: DC

## 2010-08-20 MED ORDER — ATENOLOL 100 MG PO TABS: 100.0000 mg | ORAL_TABLET | Freq: Every day | ORAL | Status: DC

## 2010-08-20 MED ORDER — DIPHENOXYLATE-ATROPINE 2.5-0.025 MG PO TABS: 1.0000 | ORAL_TABLET | Freq: Four times a day (QID) | ORAL | Status: DC | PRN

## 2010-08-20 MED ORDER — ESOMEPRAZOLE MAGNESIUM 40 MG PO CPDR: 40.0000 mg | DELAYED_RELEASE_CAPSULE | Freq: Every day | ORAL | Status: DC

## 2010-08-20 NOTE — Progress Notes (Signed)
Addended byOctaviano Glow on: 08/20/2010 01:58 PM   Modules accepted: Orders

## 2010-08-20 NOTE — Progress Notes (Signed)
  Subjective:    Patient ID: Edward Morgan, male    DOB: 1950-10-26, 60 y.o.   MRN: [id number]  HPI  60 year old man with history of attention, renal insufficiency and arthritis and is here with complaint of diarrhea that started about a week ago. The diarrhea are described as food or 6 bowel movements, watery associated with abdominal cramps. Patient does not have any sick contacts. Patient denies dizziness, prostration or constipation, persistent blood in the stool or pus in the stool. Patient has not taken any antibiotics in recent memory has not been hospitalized. Patient denies any fever, nausea vomiting. She reports a similar sickness has been experienced by him about 2 years sugar result of taking antibiotics.  Patient reports that he had blood in the stool for 2 days but then it disappeared. Patient denies any diarrhea. Patient is now undergoing testing because of the financial reasons. Patient thought that he was already improving when he came in today. However he had 4 runny bowels in the morning.patient denies taking over-the-counter medication.  Review of Systems  All other systems reviewed and are negative.       Objective:   Physical Exam  Constitutional: He is oriented to person, place, and time. He appears well-developed and well-nourished.  HENT:  Head: Normocephalic and atraumatic.  Right Ear: External ear normal.  Left Ear: External ear normal.  Eyes: Conjunctivae and EOM are normal. Pupils are equal, round, and reactive to light. Right eye exhibits no discharge. Left eye exhibits no discharge.  Neck: Normal range of motion. Neck supple. No thyromegaly present.  Cardiovascular: Normal rate and regular rhythm.   No murmur heard. Pulmonary/Chest: Effort normal and breath sounds normal. No respiratory distress. He has no wheezes. He has no rales.  Abdominal: Soft. Bowel sounds are normal. He exhibits distension. He exhibits no mass. There is no tenderness. There is no  rebound and no guarding.  Musculoskeletal: Normal range of motion.  Neurological: He is alert and oriented to person, place, and time. He has normal reflexes. No cranial nerve deficit. Coordination normal.  Skin: No rash noted. He is not diaphoretic. No erythema.  Psychiatric: He has a normal mood and affect. His behavior is normal. Judgment and thought content normal.          Assessment & Plan:

## 2010-08-20 NOTE — Telephone Encounter (Signed)
Received voice message from pt.  States medication ordered by MD this morning for diarrhea is too expensive.  Requests med from $4.00 list if possible.

## 2010-08-20 NOTE — Telephone Encounter (Signed)
Spoke to Dr Manuella Ghazi; pt came to clinic and picked up new rx.

## 2010-08-20 NOTE — Patient Instructions (Signed)
Return in one week if diarrhea not resolved. Drink plenty of water. Hold Quinapril and HCTZ if diarrhea worsens. Atleast hold HCTZ for next 2-3 days.

## 2010-08-20 NOTE — Assessment & Plan Note (Signed)
Patient does not want blood tests due to financial payments strains. Will continue to monitor. Retest in one week if diarrhea persists.

## 2010-08-20 NOTE — Assessment & Plan Note (Signed)
Likely viral gastroenteritis. No meds other than lomotil. Hydration. Hold HCTZ for 2 days and quinapril if diarrhea continues.

## 2010-08-20 NOTE — Assessment & Plan Note (Signed)
Blood pressure well regulated. Asked him to not take HCTZ  for 2 days to 3 days until her diarrhea resolves. No changes made

## 2010-08-23 ENCOUNTER — Ambulatory Visit: Payer: Self-pay | Admitting: Family Medicine

## 2010-08-30 ENCOUNTER — Ambulatory Visit: Payer: Self-pay | Admitting: Family Medicine

## 2010-09-04 ENCOUNTER — Other Ambulatory Visit: Payer: Self-pay | Admitting: *Deleted

## 2010-09-04 MED ORDER — DESLORATADINE 5 MG PO TABS: 5.0000 mg | ORAL_TABLET | Freq: Every day | ORAL | Status: DC

## 2010-09-04 NOTE — Telephone Encounter (Signed)
Clarinex refilled - rx refill request faxed to [facility].

## 2010-12-16 IMAGING — CT CT CHEST W/ CM
3 series · 17 of 29 positions shown, 19 images · IV contrast (75 ml omni 300)
Comparison: None

CLINICAL DATA: Pneumonia, cough, chest pain, shortness of breath

CT CHEST WITH CONTRAST
TECHNIQUE: Multidetector CT imaging of the chest was performed
following the standard protocol during bolus administration of
intravenous contrast.
Contrast: 75 ml Emnipaque-UPP IV

[Series 2: routine chest · axial · 0.74mm/px · z∈[-307,-92]mm · 6 of 65 slices shown, 8 images]
[im 11/65  mediastinal]
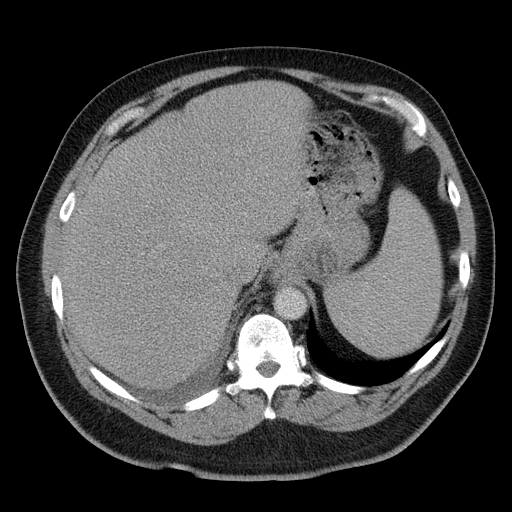
[im 11/65  lung]
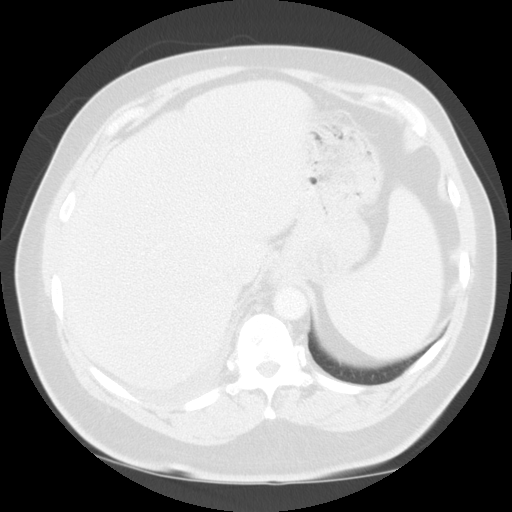
[im 22/65  lung]
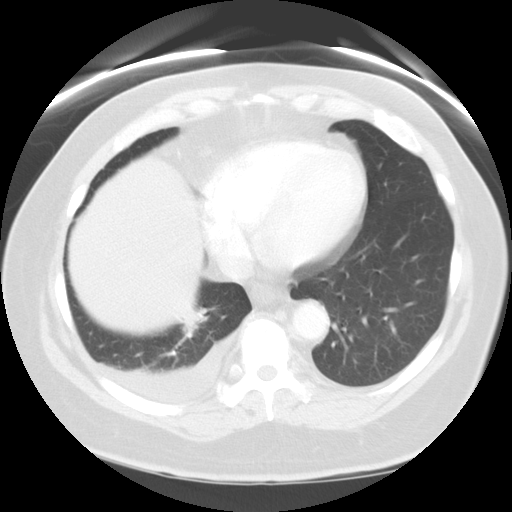
[im 33/65  lung]
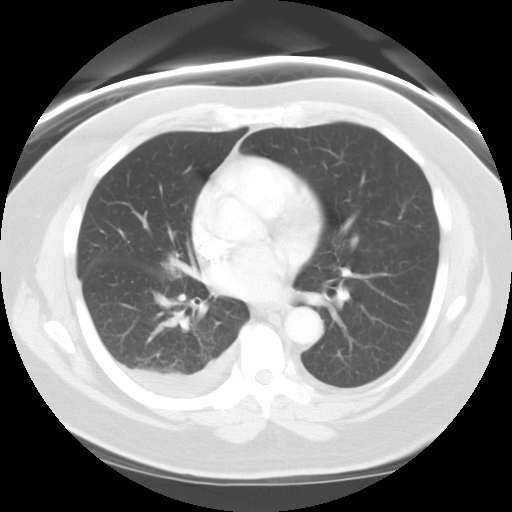
[im 34/65  lung]
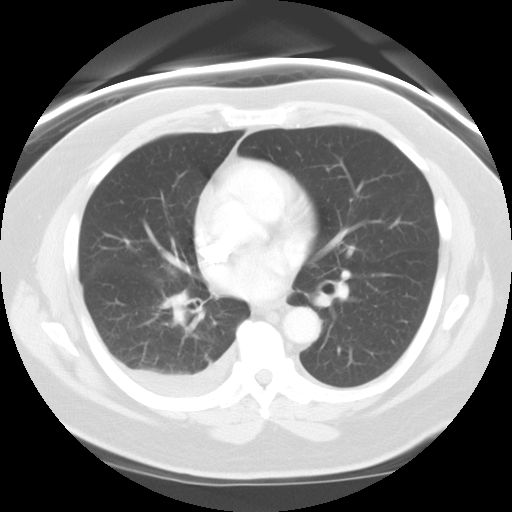
[im 43/65  mediastinal]
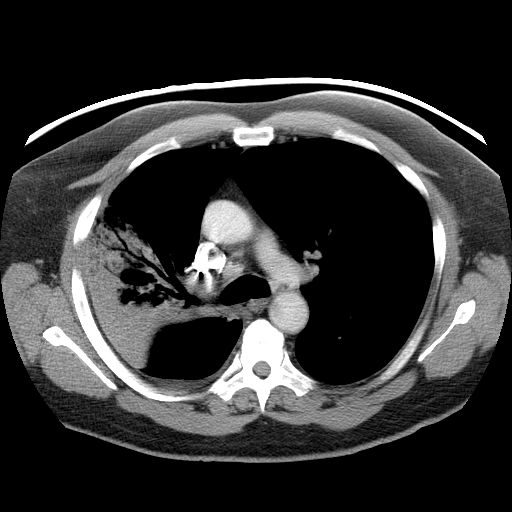
[im 43/65  lung]
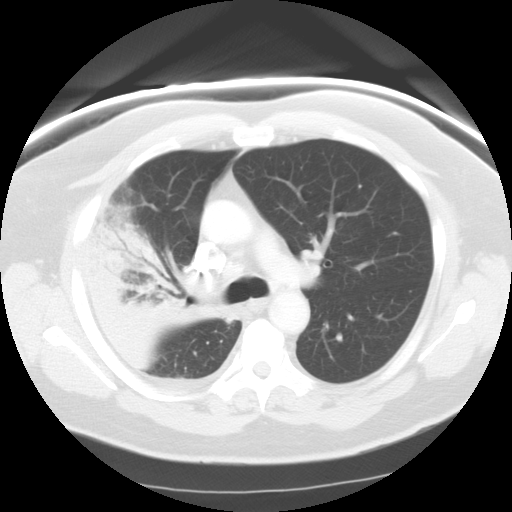
[im 54/65  lung]
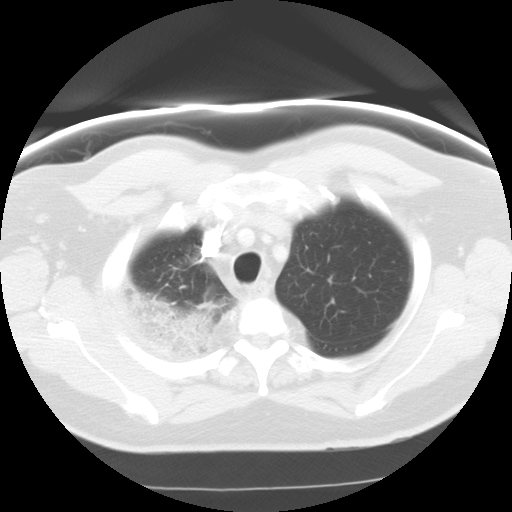

[Series 400: reformatted · sagittal · 0.74mm/px · 8 of 102 slices shown (1 of 2)]
[im 11/102  lung]
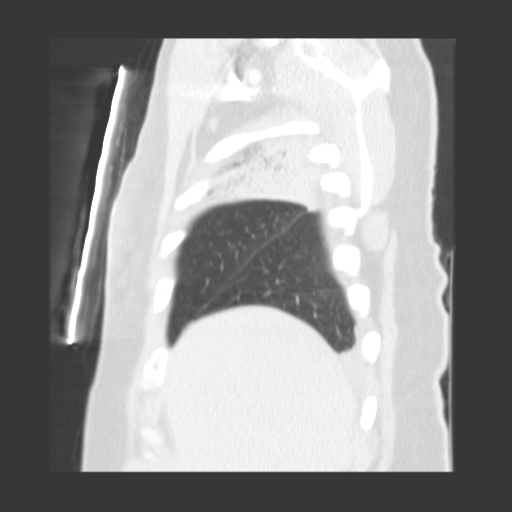
[im 21/102  lung]
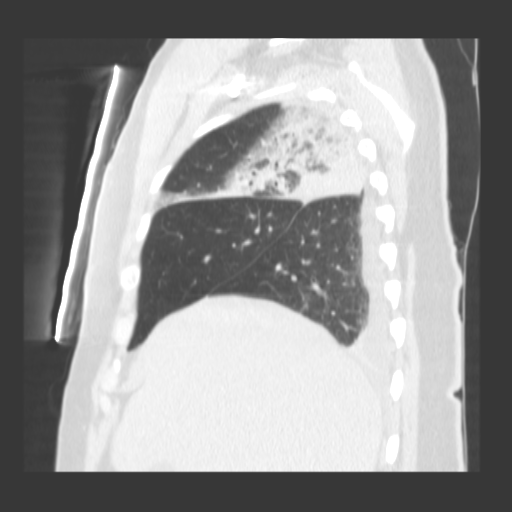
[im 31/102  lung]
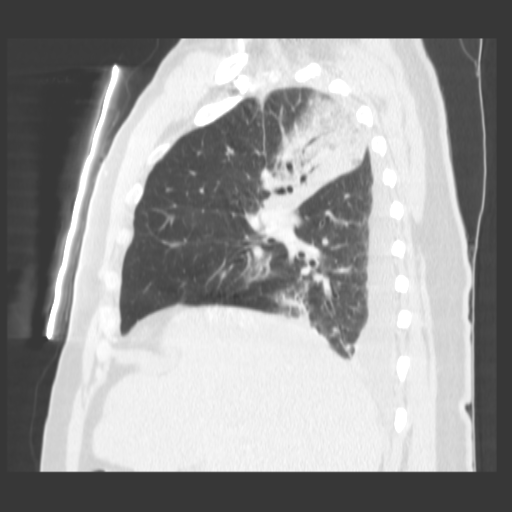
[im 41/102  lung]
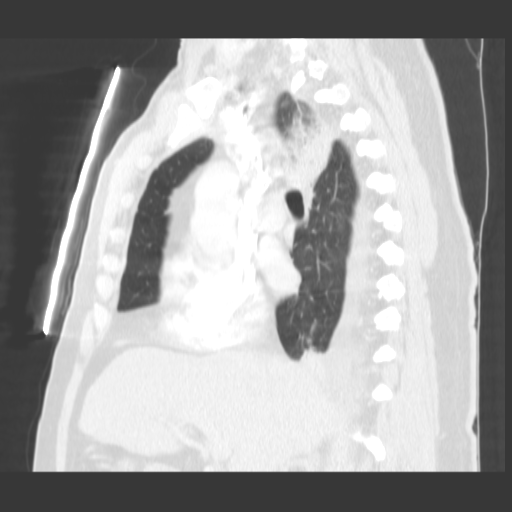
[im 61/102  lung]
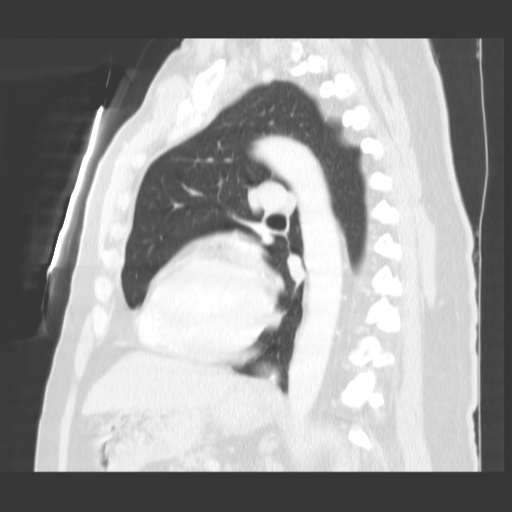
[im 71/102  lung]
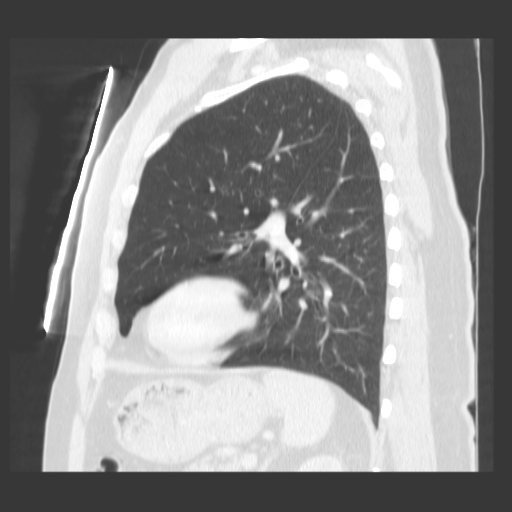
[im 81/102  lung]
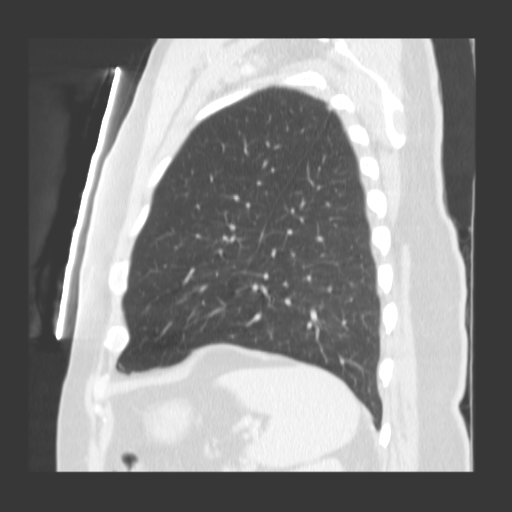
[im 91/102  lung]
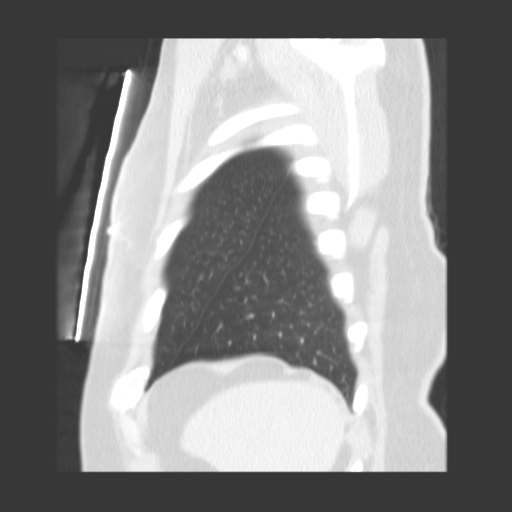

[Series 401: reformatted · coronal · 0.74mm/px · 3 of 90 slices shown (2 of 2)]
[im 10/90  lung]
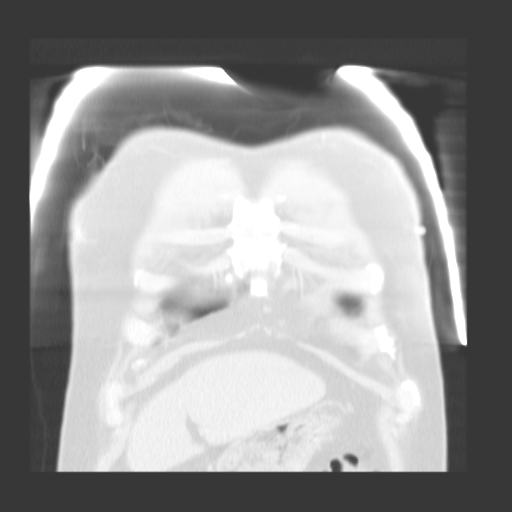
[im 20/90  lung]
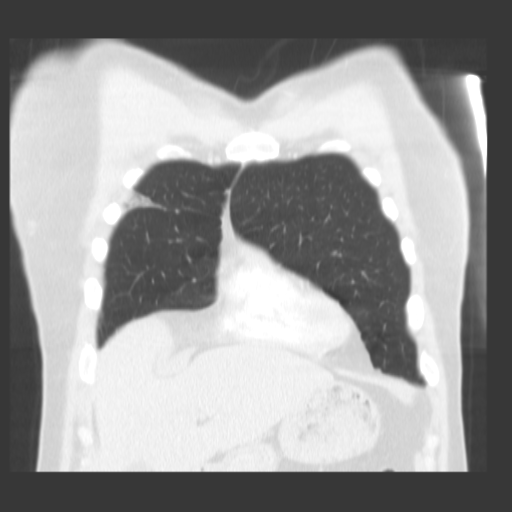
[im 30/90  lung]
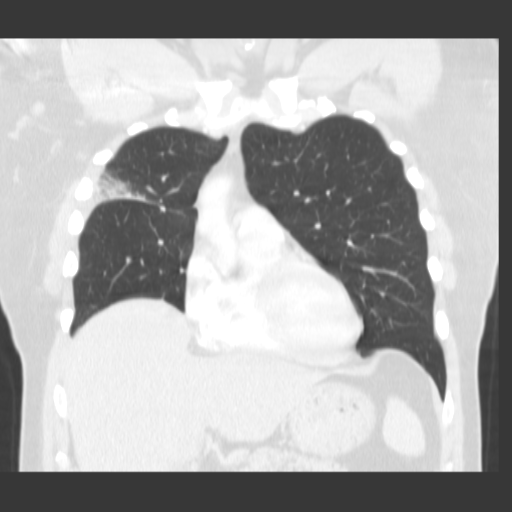

[17 of 29 positions shown; findings below may reference images not displayed]

FINDINGS: There is airspace consolidation in the posterior segment
and lateral right upper lobe with air bronchograms.  No evidence of
central obstruction.  Left lung is clear.  There is a small right
pleural effusion with some dependent atelectasis in the posterior
basal segment right lower lobe.  There are patchy airspace
infiltrates in the posteromedial right middle lobe and infrahilar
region of the     right lower lobe.  Sub centimeter right
paratracheal, AP window, precarinal, and subcarinal lymph nodes.
No definite hilar adenopathy.  Visualized upper abdomen
unremarkable.
IMPRESSION: 1.  Right upper lobe consolidative pneumonia with patchy
subsegmental infiltrates in the right middle lobe and right lower
lobe.
2.  Small right pleural effusion.
3.  Borderline mediastinal adenopathy, possibly reactive.

## 2011-01-27 ENCOUNTER — Other Ambulatory Visit: Payer: Self-pay

## 2011-01-27 ENCOUNTER — Emergency Department (HOSPITAL_COMMUNITY): Payer: Self-pay

## 2011-01-27 ENCOUNTER — Encounter (HOSPITAL_COMMUNITY): Payer: Self-pay | Admitting: *Deleted

## 2011-01-27 ENCOUNTER — Inpatient Hospital Stay (HOSPITAL_COMMUNITY)
Admission: EM | Admit: 2011-01-27 | Discharge: 2011-01-29 | DRG: 065 | Disposition: A | Discharge status: Home / Self Care | Payer: Self-pay | Source: Ambulatory Visit | Attending: Neurology | Admitting: Neurology

## 2011-01-27 DIAGNOSIS — Z23 Encounter for immunization: Secondary | ICD-10-CM

## 2011-01-27 DIAGNOSIS — E669 Obesity, unspecified: Secondary | ICD-10-CM | POA: Diagnosis present

## 2011-01-27 DIAGNOSIS — I639 Cerebral infarction, unspecified: Secondary | ICD-10-CM

## 2011-01-27 DIAGNOSIS — R4789 Other speech disturbances: Secondary | ICD-10-CM | POA: Diagnosis present

## 2011-01-27 DIAGNOSIS — I619 Nontraumatic intracerebral hemorrhage, unspecified: Principal | ICD-10-CM | POA: Diagnosis present

## 2011-01-27 DIAGNOSIS — Z6831 Body mass index (BMI) 31.0-31.9, adult: Secondary | ICD-10-CM

## 2011-01-27 DIAGNOSIS — R29810 Facial weakness: Secondary | ICD-10-CM | POA: Diagnosis present

## 2011-01-27 DIAGNOSIS — G8190 Hemiplegia, unspecified affecting unspecified side: Secondary | ICD-10-CM | POA: Diagnosis present

## 2011-01-27 DIAGNOSIS — I10 Essential (primary) hypertension: Secondary | ICD-10-CM | POA: Diagnosis present

## 2011-01-27 DIAGNOSIS — M129 Arthropathy, unspecified: Secondary | ICD-10-CM | POA: Diagnosis present

## 2011-01-27 DIAGNOSIS — K219 Gastro-esophageal reflux disease without esophagitis: Secondary | ICD-10-CM | POA: Diagnosis present

## 2011-01-27 DIAGNOSIS — B1920 Unspecified viral hepatitis C without hepatic coma: Secondary | ICD-10-CM | POA: Diagnosis present

## 2011-01-27 HISTORY — DX: Shortness of breath: R06.02

## 2011-01-27 HISTORY — DX: Cerebral infarction, unspecified: I63.9

## 2011-01-27 LAB — CK TOTAL AND CKMB (NOT AT ARMC): CK, MB: 3.3 ng/mL (ref 0.3–4.0)

## 2011-01-27 LAB — COMPREHENSIVE METABOLIC PANEL
Alkaline Phosphatase: 78 U/L (ref 39–117)
BUN: 20 mg/dL (ref 6–23)
Creatinine, Ser: 1.44 mg/dL — ABNORMAL HIGH (ref 0.50–1.35)
GFR calc Af Amer: 59 mL/min — ABNORMAL LOW (ref >90)
Glucose, Bld: 108 mg/dL — ABNORMAL HIGH (ref 70–99)
Potassium: 3.8 mEq/L (ref 3.5–5.1)
Total Bilirubin: 0.5 mg/dL (ref 0.3–1.2)
Total Protein: 7.4 g/dL (ref 6.0–8.3)

## 2011-01-27 LAB — CBC
HCT: 43.9 % (ref 39.0–52.0)
Hemoglobin: 14.1 g/dL (ref 13.0–17.0)
MCH: 28.4 pg (ref 26.0–34.0)
MCHC: 32.1 g/dL (ref 30.0–36.0)
MCV: 88.3 fL (ref 78.0–100.0)
RBC: 4.97 MIL/uL (ref 4.22–5.81)

## 2011-01-27 LAB — DIFFERENTIAL
Eosinophils Absolute: 0.1 10*3/uL (ref 0.0–0.7)
Eosinophils Relative: 2 % (ref 0–5)
Lymphs Abs: 0.9 10*3/uL (ref 0.7–4.0)
Monocytes Absolute: 0.7 10*3/uL (ref 0.1–1.0)
Monocytes Relative: 16 % — ABNORMAL HIGH (ref 3–12)
Neutrophils Relative %: 60 % (ref 43–77)

## 2011-01-27 LAB — GLUCOSE, CAPILLARY: Glucose-Capillary: 124 mg/dL — ABNORMAL HIGH (ref 70–99)

## 2011-01-27 LAB — POCT I-STAT, CHEM 8
BUN: 22 mg/dL (ref 6–23)
Calcium, Ion: 1.13 mmol/L (ref 1.12–1.32)
Creatinine, Ser: 1.6 mg/dL — ABNORMAL HIGH (ref 0.50–1.35)
Glucose, Bld: 116 mg/dL — ABNORMAL HIGH (ref 70–99)
Hemoglobin: 16.3 g/dL (ref 13.0–17.0)
TCO2: 26 mmol/L (ref 0–100)

## 2011-01-27 LAB — TROPONIN I: Troponin I: <0.3 ng/mL (ref <0.30)

## 2011-01-27 LAB — MRSA PCR SCREENING: MRSA by PCR: NEGATIVE

## 2011-01-27 MED ORDER — NICARDIPINE HCL IN NACL 40-0.83 MG/200ML-% IV SOLN
5.0000 mg/h | Freq: Once | INTRAVENOUS | Status: DC
  Filled 2011-01-27: qty 200

## 2011-01-27 MED ORDER — ACETAMINOPHEN 650 MG RE SUPP: 650.0000 mg | RECTAL | Status: DC | PRN

## 2011-01-27 MED ORDER — STUDY - SODIUM CHLORIDE 0.9% IV SOLN
1.0000 mg/h | Status: DC
  Administered 2011-01-27 (×2): 15 mg/h via INTRAVENOUS
  Administered 2011-01-28: 3 mg/h via INTRAVENOUS
  Filled 2011-01-27 (×4): qty 20

## 2011-01-27 MED ORDER — LABETALOL HCL 5 MG/ML IV SOLN
10.0000 mg | Freq: Once | INTRAVENOUS | Status: DC
  Filled 2011-01-27: qty 4

## 2011-01-27 MED ORDER — HYDRALAZINE HCL 20 MG/ML IJ SOLN
5.0000 mg | INTRAMUSCULAR | Status: DC | PRN
  Administered 2011-01-27 (×2): 10 mg via INTRAVENOUS
  Filled 2011-01-27: qty 1
  Filled 2011-01-27: qty 0.5

## 2011-01-27 MED ORDER — SENNOSIDES-DOCUSATE SODIUM 8.6-50 MG PO TABS
1.0000 | ORAL_TABLET | Freq: Two times a day (BID) | ORAL | Status: DC
  Administered 2011-01-27 – 2011-01-28 (×4): 1 via ORAL
  Filled 2011-01-27 (×6): qty 1

## 2011-01-27 MED ORDER — ASPIRIN 325 MG PO TABS: 325.0000 mg | ORAL_TABLET | Freq: Once | ORAL | Status: DC

## 2011-01-27 MED ORDER — NICARDIPINE HCL IN NACL 20-0.86 MG/200ML-% IV SOLN
5.0000 mg/h | INTRAVENOUS | Status: DC
  Filled 2011-01-27: qty 200

## 2011-01-27 MED ORDER — PANTOPRAZOLE SODIUM 40 MG IV SOLR
40.0000 mg | Freq: Every day | INTRAVENOUS | Status: DC
  Administered 2011-01-27 – 2011-01-28 (×2): 40 mg via INTRAVENOUS
  Filled 2011-01-27 (×3): qty 40

## 2011-01-27 MED ORDER — INFLUENZA VIRUS VACC SPLIT PF IM SUSP
0.5000 mL | INTRAMUSCULAR | Status: AC
  Administered 2011-01-28: 0.5 mL via INTRAMUSCULAR
  Filled 2011-01-27: qty 0.5

## 2011-01-27 MED ORDER — QUINAPRIL HCL 10 MG PO TABS: 20.0000 mg | ORAL_TABLET | Freq: Two times a day (BID) | ORAL | Status: DC

## 2011-01-27 MED ORDER — ATENOLOL 100 MG PO TABS
100.0000 mg | ORAL_TABLET | Freq: Every day | ORAL | Status: DC
  Administered 2011-01-27 – 2011-01-29 (×3): 100 mg via ORAL
  Filled 2011-01-27 (×3): qty 1

## 2011-01-27 MED ORDER — PANTOPRAZOLE SODIUM 40 MG PO TBEC
40.0000 mg | DELAYED_RELEASE_TABLET | Freq: Every day | ORAL | Status: DC
  Administered 2011-01-27 – 2011-01-29 (×3): 40 mg via ORAL
  Filled 2011-01-27 (×3): qty 1

## 2011-01-27 MED ORDER — LABETALOL HCL 5 MG/ML IV SOLN
5.0000 mg | INTRAVENOUS | Status: DC | PRN
Start: 2011-01-27 — End: 2011-01-29
  Filled 2011-01-27: qty 4

## 2011-01-27 MED ORDER — HYDROCHLOROTHIAZIDE 25 MG PO TABS
25.0000 mg | ORAL_TABLET | Freq: Every day | ORAL | Status: DC
  Administered 2011-01-27 – 2011-01-29 (×3): 25 mg via ORAL
  Filled 2011-01-27 (×3): qty 1

## 2011-01-27 MED ORDER — LABETALOL HCL 5 MG/ML IV SOLN
10.0000 mg | INTRAVENOUS | Status: DC | PRN
  Filled 2011-01-27: qty 8

## 2011-01-27 MED ORDER — LISINOPRIL 20 MG PO TABS
20.0000 mg | ORAL_TABLET | Freq: Two times a day (BID) | ORAL | Status: DC
  Administered 2011-01-27 – 2011-01-29 (×5): 20 mg via ORAL
  Filled 2011-01-27 (×6): qty 1

## 2011-01-27 MED ORDER — NICARDIPINE HCL IN NACL 20-0.86 MG/200ML-% IV SOLN
5.0000 mg/h | INTRAVENOUS | Status: AC
  Administered 2011-01-27: 15 mg/h via INTRAVENOUS
  Administered 2011-01-27: 5 mg/h via INTRAVENOUS
  Filled 2011-01-27 (×3): qty 200

## 2011-01-27 MED ORDER — AMLODIPINE BESYLATE 10 MG PO TABS
10.0000 mg | ORAL_TABLET | Freq: Every day | ORAL | Status: DC
  Administered 2011-01-27 – 2011-01-29 (×3): 10 mg via ORAL
  Filled 2011-01-27 (×3): qty 1

## 2011-01-27 MED ORDER — ACETAMINOPHEN 325 MG PO TABS: 650.0000 mg | ORAL_TABLET | ORAL | Status: DC | PRN

## 2011-01-27 MED ORDER — ONDANSETRON HCL 4 MG/2ML IJ SOLN: 4.0000 mg | Freq: Four times a day (QID) | INTRAMUSCULAR | Status: DC | PRN

## 2011-01-27 NOTE — ED Notes (Signed)
Pt brought over from [place] for possible stroke, assumed care of pt

## 2011-01-27 NOTE — Progress Notes (Signed)
Speech Language/Pathology Clinical/Bedside Swallow Evaluation Patient Details  Name: KEIZER STIGEN MRN: [id number] DOB: December 02, 1950 Today's Date: 01/27/2011  Past Medical History:  Past Medical History  Diagnosis Date  . Depression   . Hypertension   . 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  [name] GERD (gastroesophageal reflux disease)   . Arthritis   . H. pylori infection     history of  . Shortness of breath   . Stroke 01/27/2011   Past Surgical History:  Past Surgical History  Procedure Date  . Rotator cuff repair   . Closed reduction, percutaneous pinning of right 5th finger,   . Left knee surgery   . Colonoscopy 10/09    by [facility]: showed sigmoid Diverticulosis    Assessment/Recommendations/Treatment Plan    SLP Assessment Clinical Impression Statement: Pt. did not exhibit any overt s/s of aspiration with any po trials.  Required min assistance to follow strategies and Pt. was able to verbalize and demonstrate understanding.  Pt. would benefit from a one time f/u to further assess diet tolerance due to intermittent lethargy. Risk for Aspiration: Mild Other Related Risk Factors: History of GERD;Lethargy  Recommendations Solid Consistency: Regular Liquid Consistency: Thin Liquid Administration via: Cup;Straw Medication Administration: Whole meds with puree Supervision: Patient able to self feed Compensations: Small sips/bites;Follow solids with liquid Postural Changes and/or Swallow Maneuvers: Seated upright 90 degrees;Upright 30-60 min after meal  Prognosis Prognosis for Safe Diet Advancement: Good  Individuals Consulted Consulted and Agree with Results and Recommendations: Patient  Swallow Study Goals  SLP Swallowing Goals Patient will consume recommended diet without observed clinical signs of aspiration with: Modified independent  assistance Patient will utilize recommended strategies during swallow to increase swallowing safety with: Modified independent assistance  Swallow Study Prior Functional Status     General  Type of Study: Bedside swallow evaluation Diet Prior to this Study: NPO Behavior/Cognition: Lethargic;Cooperative Oral Cavity - Dentition: Adequate natural dentition Vision: Functional for self-feeding Patient Positioning: Upright in bed Baseline Vocal Quality: Normal Volitional Cough: Strong Volitional Swallow: Able to elicit Ice chips: Not tested  Oral Motor/Sensory Function  Labial ROM: Reduced right Labial Symmetry: Abnormal symmetry right Labial Strength: Reduced Lingual ROM: Reduced right Lingual Symmetry: Abnormal symmetry right Lingual Strength: Reduced Facial ROM: Reduced right Facial Symmetry: Right droop Facial Strength: Reduced Velum: Within Functional Limits Mandible: Within Functional Limits  Consistency Results  Ice Chips Ice chips: Not tested  Thin Liquid Thin Liquid: Within functional limits Presentation: Self Fed;Straw;Cup  Nectar Thick Liquid Nectar Thick Liquid: Not tested  Honey Thick Liquid Honey Thick Liquid: Not tested  Puree Puree: Within functional limits Presentation: Self Fed;Spoon  Solid Solid: Within functional limits Presentation: Self Fed  Dolphus Jenny, SLP student  Orbie Pyo Colvin Caroli.Ed [employer] [phone]  01/27/2011

## 2011-01-27 NOTE — ED Provider Notes (Addendum)
History     CSN: [id number] Arrival date & time: 01/27/2011  8:21 AM   First MD Initiated Contact with Patient 01/27/11 [phone]      Chief Complaint  Patient presents with  . Extremity Weakness    (Consider location/radiation/quality/duration/timing/severity/associated sxs/prior treatment) HPI Comments: The patient is a 60 year old male with a history of hypertension and no history of prior stroke or heart attack who presents with a chief complaint of right hemiparesis of the upper and lower extremity, right facial droop, and slurred speech that began at approximately 7:30 this morning after working out at the gym. He reports that he woke up this morning at 5:30 AM without any deficits. On evaluation, the patient is awake, alert, and oriented to person, place, time, and event, and is in no apparent distress. He does have a mild right facial droop evident and does have slurred speech. On examination, he has right upper extremity pronator drift and right lower extremity drift as well. His NIH SS is 4. A code stroke was called.  Patient is a 60 y.o. male presenting with extremity weakness. The history is provided by the patient and the spouse.  Extremity Weakness This is a new problem. The current episode started 1 to 2 hours ago. The problem occurs constantly. The problem has not changed since onset.Pertinent negatives include no chest pain, no abdominal pain, no headaches and no shortness of breath. The symptoms are aggravated by nothing. The symptoms are relieved by nothing. He has tried nothing for the symptoms.    Past Medical History  Diagnosis Date  . Depression   . Hypertension   . 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  [name] GERD (gastroesophageal reflux disease)   . Arthritis   . H. pylori infection     history of    Past Surgical History  Procedure Date  . Rotator  cuff repair   . Closed reduction, percutaneous pinning of right 5th finger,   . Left knee surgery   . Colonoscopy 10/09    by [facility]: showed sigmoid Diverticulosis    Family History  Problem Relation Age of Onset  . Hypertension Mother   . Hypertension Father     History  Substance Use Topics  . Smoking status: Never Smoker   . Smokeless tobacco: Not on file  . Alcohol Use: No      Review of Systems  Unable to perform ROS: Other  Constitutional:       Do to urgency of time with a possible stroke, the full review of systems was not able to be completed.  Respiratory: Negative for shortness of breath.   Cardiovascular: Negative for chest pain.  Gastrointestinal: Negative for abdominal pain.  Musculoskeletal: Positive for extremity weakness.  Neurological: Negative for headaches.    Allergies  Penicillins  Home Medications   Current Outpatient Rx  Name Route Sig Dispense Refill  . AMLODIPINE BESYLATE 10 MG PO TABS Oral Take 1 tablet (10 mg total) by mouth daily. 30 tablet 6  . ATENOLOL 100 MG PO TABS Oral Take 1 tablet (100 mg total) by mouth daily. 30 tablet 6  . DESLORATADINE 5 MG PO TABS Oral Take 1 tablet (5 mg total) by mouth daily. 90 tablet 3  . ESOMEPRAZOLE MAGNESIUM 40 MG PO CPDR Oral Take 1 capsule (40 mg total) by mouth daily before breakfast.  30 capsule 6  . HYDROCHLOROTHIAZIDE 25 MG PO TABS Oral Take 1 tablet (25 mg total) by mouth daily. 30 tablet 6  . MENS MULTI VITAMIN & MINERAL PO TABS Oral Take 1 tablet by mouth daily.      . QUINAPRIL HCL 20 MG PO TABS Oral Take 1 tablet (20 mg total) by mouth 2 (two) times daily. 60 tablet 6  . SILDENAFIL CITRATE 100 MG PO TABS Oral Take 1 tablet (100 mg total) by mouth as directed. 10 tablet 3    BP 174/85  Pulse 75  Temp(Src) 98.8 F (37.1 C) (Oral)  Resp 10  SpO2 100%  Physical Exam  Nursing note and vitals reviewed. Constitutional: He is oriented to person, place, and time. He appears well-nourished. He  is active and cooperative.  Non-toxic appearance. He does not have a sickly appearance. He does not appear ill. No distress.  HENT:  Head: Normocephalic and atraumatic. Head is without raccoon's eyes, without Battle's sign and without contusion. No trismus in the jaw.  Right Ear: Hearing, tympanic membrane, external ear and ear canal normal.  Left Ear: Hearing, tympanic membrane, external ear and ear canal normal.  Nose: Nose normal.  Mouth/Throat: Uvula is midline, oropharynx is clear and moist and mucous membranes are normal. No uvula swelling. No oropharyngeal exudate.  Eyes: Conjunctivae, EOM and lids are normal. Pupils are equal, round, and reactive to light. Right eye exhibits no chemosis, no discharge and no exudate. Left eye exhibits no chemosis, no discharge and no exudate. Right conjunctiva is not injected. Left conjunctiva is not injected. Right eye exhibits normal extraocular motion and no nystagmus. Left eye exhibits normal extraocular motion and no nystagmus.  Neck: Trachea normal, normal range of motion and phonation normal. Neck supple. No JVD present. Carotid bruit is not present. No rigidity. Normal range of motion present. No Brudzinski's sign noted.  Cardiovascular: Normal rate, regular rhythm, S1 normal, S2 normal, intact distal pulses and normal pulses.  Exam reveals no gallop and no friction rub.   Pulmonary/Chest: Effort normal and breath sounds normal. No accessory muscle usage. Not tachypneic. No respiratory distress. He has no decreased breath sounds. He has no wheezes. He has no rhonchi. He has no rales.  Abdominal: Soft. Normal appearance, normal aorta and bowel sounds are normal. He exhibits no pulsatile midline mass. There is no tenderness. There is no rigidity, no rebound and no guarding.  Neurological: He is alert and oriented to person, place, and time. He is not disoriented. He displays no tremor and normal reflexes. A cranial nerve deficit is present. No sensory  deficit. He exhibits normal muscle tone. He displays no seizure activity. Coordination normal. GCS eye subscore is 4. GCS verbal subscore is 5. GCS motor subscore is 6. He displays no Babinski's sign on the right side. He displays no Babinski's sign on the left side.  Reflex Scores:      Tricep reflexes are 1+ on the right side and 1+ on the left side.      Bicep reflexes are 1+ on the right side and 1+ on the left side.      Brachioradialis reflexes are 1+ on the right side and 1+ on the left side.      Patellar reflexes are 1+ on the right side and 1+ on the left side.      Achilles reflexes are 1+ on the right side and 1+ on the left side.      The patient's scores 4 on  the NIH stroke scale, one point each for right facial droop, right pronator drift, right lower extremity drift, and dysarthria. He has no aphasia.  Skin: Skin is warm, dry and intact. He is not diaphoretic. No pallor.  Psychiatric: He has a normal mood and affect. His speech is normal and behavior is normal. Thought content normal. Cognition and memory are normal.    ED Course  Procedures (including critical care time)  Date: 01/27/2011  Rate: 76  Rhythm: normal sinus rhythm  QRS Axis: normal  Intervals: normal  ST/T Wave abnormalities: normal  Conduction Disutrbances:none  Narrative Interpretation: Non-provocative EKG  Old EKG Reviewed: unchanged    Labs Reviewed  I-STAT, CHEM 8  PROTIME-INR  APTT  CBC  DIFFERENTIAL  COMPREHENSIVE METABOLIC PANEL  CK TOTAL AND CKMB  TROPONIN I  POCT CBG MONITORING  URINALYSIS, ROUTINE W REFLEX MICROSCOPIC   No results found.   No diagnosis found.    MDM  Acute stroke, TIA, electrolyte abnormality, myocardial infarction, anemia, arrhythmia are all entertained in the patient's differential diagnosis amongst other potential etiologies of his symptoms. At this time with the abrupt changes this morning and the findings on physical exam, a stroke is suggested. A code stroke  was called and the patient was taken directly to CT scan. I am awaiting the results of the CT scan.    CRITICAL CARE Performed by: Charlena Cross   Total critical care time: 30 minutes  Critical care time was exclusive of separately billable procedures and treating other patients.  Critical care was necessary to treat or prevent imminent or life-threatening deterioration.  Critical care was time spent personally by me on the following activities: development of treatment plan with patient and/or surrogate as well as nursing, discussions with consultants, evaluation of patient's response to treatment, examination of patient, obtaining history from patient or surrogate, ordering and performing treatments and interventions, ordering and review of laboratory studies, ordering and review of radiographic studies, pulse oximetry and re-evaluation of patient's condition.     Charlena Cross, MD 01/27/11 0845  8:55 AM I spoke with Dr. Doy Mince of neurology on call while the patient was in CT scan and she requested that we send the patient emergently to [facility] emergency department. Care length was in the emergency department and loaded the patient directly from CT scan onto their gurney and into the ambulance to transport him to [facility] cone. After the patient had left, CT results came back showing what looks like a left hemispheric bleed in the cerebrum. I have contacted Dr. Philipp Deputy at [facility] emergency department to inform him of the patient and his pending arrival as well as the CT scan and physical exam findings. I am awaiting a call back from Dr. Hal Neer of neurosurgery to inform him of the patient's findings.  Charlena Cross, MD 01/27/11 [phone]

## 2011-01-27 NOTE — ED Notes (Signed)
[id number] Expected date:01/27/11<BR> Expected time: 7:30 AM<BR> Means of arrival:<BR> Comments:<BR> closed

## 2011-01-27 NOTE — ED Notes (Signed)
Notified pharmacy of medications

## 2011-01-27 NOTE — ED Notes (Signed)
Spouse states "we were at the gym & when we came out I said your mouth is looking funny"; pt presents with facial droop and unequal grips, pt also c/o "legs are weak"

## 2011-01-27 NOTE — ED Notes (Signed)
Hydralazine to be given if bp goes back up to 150's

## 2011-01-27 NOTE — ED Notes (Addendum)
Pt randomized to intensive treatment arm, systolic bp needs to be 123456.  Increased cardene drip to 7.5 per Gwynneth Macleod, RN

## 2011-01-27 NOTE — Code Documentation (Signed)
60 yo male who was at the gym when at 0730 he had sudden onset R side weakness and slurred speech. Wife was with pt at the gym and drove him to [place]. Code stroke was activated at [place]. CT scan was done which showed small BGH.  Pt was lifting weights prior to neuro change.  He arrived at [facility] at [phone]. Pt is awake and alert, NIHSS=4, 1 each for sl sp, RUE & RLE drift, and pt noted decreased sensation of his R face.  BP remained elevated and cardene drip started at 0940 for BP 180/95. 3100 bed requested. Continuous monitoring; neuro stable.  Wife and pt aware of dx. Offered enrollment in the [facility] trial and agreeable. Research staff here.

## 2011-01-27 NOTE — H&P (Signed)
Admission H&P    Chief Complaint: Right sided weakness/slurred speech HPI: Edward Morgan is an 60 y.o. male that woke up normal this morning.  Went to the gym and on leaving the gym at 730am noted that he was dragging the right leg.  Was noted to have slurred speech as well.  Patient presented to [facility] ED where he was found to have a right facial droop, right sided numbness and weakness/slurred speech.  CT performed and patient transferred to [facility] ED.  Initial NIHSS of 4.  Head shows left BG hemorrhage.  BP elevated.    LSN: [id number]  tPA Given: No: ICH  Past Medical History  Diagnosis Date  . Depression   . Hypertension   . 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  [name] GERD (gastroesophageal reflux disease)   . Arthritis   . H. pylori infection     history of    Past Surgical History  Procedure Date  . Rotator cuff repair   . Closed reduction, percutaneous pinning of right 5th finger,   . Left knee surgery   . Colonoscopy 10/09    by [place]: showed sigmoid Diverticulosis    Family History  Problem Relation Age of Onset  . Hypertension Mother   . Hypertension Father    Social History:  reports that he has never smoked. He does not have any smokeless tobacco history on file. He reports that he does not drink alcohol or use illicit drugs.  Allergies:  Allergies  Allergen Reactions  . Penicillins     REACTION: tongue swells    Medications Prior to Admission  Medication Dose Route Frequency Provider Last Rate Last Dose  . DISCONTD: aspirin tablet 325 mg  325 mg Oral Once Charlena Cross, MD       Medications Prior to Admission  Medication Sig Dispense Refill  . amLODipine (NORVASC) 10 MG tablet Take 1 tablet (10 mg total) by mouth daily.  30 tablet  6  . atenolol (TENORMIN) 100 MG tablet Take 1 tablet (100 mg total) by mouth daily.  30 tablet  6  . desloratadine  (CLARINEX) 5 MG tablet Take 1 tablet (5 mg total) by mouth daily.  90 tablet  3  . esomeprazole (NEXIUM) 40 MG capsule Take 1 capsule (40 mg total) by mouth daily before breakfast.  30 capsule  6  . hydrochlorothiazide 25 MG tablet Take 1 tablet (25 mg total) by mouth daily.  30 tablet  6  . Multiple Vitamins-Minerals (MENS MULTI VITAMIN & MINERAL) TABS Take 1 tablet by mouth daily.        . quinapril (ACCUPRIL) 20 MG tablet Take 1 tablet (20 mg total) by mouth 2 (two) times daily.  60 tablet  6  . sildenafil (VIAGRA) 100 MG tablet Take 1 tablet (100 mg total) by mouth as directed.  10 tablet  3    ROS: History obtained from the patient  General ROS: negative for - chills, fatigue, fever, night sweats, weight gain or weight loss Psychological ROS: negative for - behavioral disorder, hallucinations, memory difficulties, mood swings or suicidal ideation Ophthalmic ROS: negative for - blurry vision, double vision, eye pain or loss of vision ENT ROS: negative for - epistaxis, nasal discharge, oral lesions, sore throat, tinnitus or vertigo Allergy and Immunology ROS: negative for - hives or itchy/watery eyes Hematological  and Lymphatic ROS: negative for - bleeding problems, bruising or swollen lymph nodes Endocrine ROS: negative for - galactorrhea, hair pattern changes, polydipsia/polyuria or temperature intolerance Respiratory ROS: negative for - cough, hemoptysis, shortness of breath or wheezing Cardiovascular ROS: negative for - chest pain, dyspnea on exertion, edema or irregular heartbeat Gastrointestinal ROS: negative for - abdominal pain, diarrhea, hematemesis, nausea/vomiting or stool incontinence Genito-Urinary ROS: negative for - dysuria, hematuria, incontinence or urinary frequency/urgency Musculoskeletal ROS: negative for - joint swelling or muscular weakness Neurological ROS: as noted in HPI Dermatological ROS: negative for rash and skin lesion changes  Physical Examination: Blood  pressure 174/85, pulse 75, temperature 98.8 F (37.1 C), temperature source Oral, resp. rate 10, SpO2 100.00%.  HEENT-  Normocephalic, no lesions, without obvious abnormality.  Normal external eye and conjunctiva.  Normal TM's bilaterally.  Normal auditory canals and external ears. Normal external nose, mucus membranes and septum.  Normal pharynx. Neck supple with no masses, nodes, nodules or enlargement. Cardiovascular - S1, S2 normal Lungs - chest clear, no wheezing, rales, normal symmetric air entry Abdomen - soft, non-tender; bowel sounds normal; no masses,  no organomegaly Extremities - no edema  Neurologic Examination: Mental Status: Alert, oriented, thought content appropriate.  Speech fluent without evidence of aphasia but dysarthric.  Able to follow 3 step commands without difficulty. Cranial Nerves: II: visual fields grossly normal, pupils equal, round, reactive to light and accommodation III,IV, VI: ptosis not present, extraocular muscles extra-ocular motions intact bilaterally V,VII: smile symmetric, facial light touch sensation normal bilaterally VIII: hearing normal bilaterally IX,X: gag reflex present XI: trapezius strength/neck flexion strength normal bilaterally XII: tongue strength normal  Motor: Right : Upper extremity   5-/5    Left:     Upper extremity   5/5  Lower extremity   5-/5     Lower extremity   5/5 Tone and bulk:normal tone throughout; no atrophy noted Sensory: Pinprick and light touch intact throughout, bilaterally Deep Tendon Reflexes: 2+ and symmetric with absent AJ's bilaterally. Plantars: Right: downgoing   Left: downgoing Cerebellar: normal finger-to-nose and normal heel-to-shin test     No results found for this or any previous visit (from the past 48 hour(s)). Ct Head Wo Contrast  01/27/2011  *RADIOLOGY REPORT*  Clinical Data: Code stroke.  Right-sided facial droop and dysarthria.  Right-sided weakness.  CT HEAD WITHOUT CONTRAST  Technique:   Contiguous axial images were obtained from the base of the skull through the vertex without contrast.  Comparison: CT head without contrast 12/20/2004.  Findings: Focal hemorrhage is present within the left lentiform nucleus, involving the globus pallidus and measuring 23 x 10 x 20 mm.  There is a punctate hemorrhage within the internal capsule as well.  Mild associated edema is evident.  There is no significant midline shift.  No intraventricular or subarachnoid blood is evident. Edema extends to the lateral thalamus versus focal ischemic infarct within the lateral thalamus.  No other focal ischemic infarct is evident.  The ventricles are normal size.  No significant extra-axial fluid collection is present.  The paranasal sinuses and mastoid air cells are clear.  The osseous skull is intact.  IMPRESSION: 1.  Left basal ganglia hemorrhage within the lentiform nucleus measures 10 x 23 x 20 mm. 2.  Additional punctate hemorrhage may be present within the internal capsule. 3.  Mild surrounding edema without significant mass effect. 4.  Potential ischemic infarct versus edema within the lateral left thalamus.  These results were called by telephone on  02/23/2011  at  08:50 a.m. to  Dr. Venora Maples, who verbally acknowledged these results.  Original Report Authenticated By: Resa Miner. MATTERN, M.D.    Assessment: 60 y.o. male presenting with right sided numbness/weakness and difficulty with speech.  Imaging shows left BG hemorrhage likely related to history of hypertension.  BP elevated.  Will be admitted to NICU for BP to be managed and follow up of hemorrhage.  Stroke Risk Factors - hypertension  Plan: 1. HgbA1c, fasting lipid panel 2. MRI, MRA  of the brain without contrast 3. PT consult, OT consult, Speech consult 4. Echocardiogram 5. Carotid dopplers 6. Prophylactic therapy-None at this tine secondary to hmeorrhage 7. BP management 8. Repeat head CT in AM  Alexis Goodell, MD [facility] [phone] 01/27/2011, 9:11 AM

## 2011-01-27 NOTE — ED Notes (Signed)
[facility] at bedside, report given. 

## 2011-01-27 NOTE — ED Provider Notes (Signed)
Chief Complaint   Patient presents with   .  Extremity Weakness    (Consider location/radiation/quality/duration/timing/severity/associated sxs/prior  treatment)   HPI Comments: The patient is a 60 year old male with a history of hypertension and no history of prior stroke or heart attack who presents with a chief complaint of right hemiparesis of the upper and lower extremity, right facial droop, and slurred speech that began at approximately 7:30 this morning after working out at the gym. He reports that he woke up this morning at 5:30 AM without any deficits. On evaluation, the patient is awake, alert, and oriented to person, place, time, and event, and is in no apparent distress. He does have a mild right facial droop evident and does have slurred speech. On examination, he has right upper extremity pronator drift and right lower extremity drift as well. His NIH SS is 4. A code stroke was called.  Patient is a 60 y.o. male presenting with extremity weakness. The history is provided by the patient and the spouse.   Extremity Weakness  This is a new problem. The current episode started 1 to 2 hours ago. The problem occurs constantly. The problem has not changed since onset.Pertinent negatives include no chest pain, no abdominal pain, no headaches and no shortness of breath. The symptoms are aggravated by nothing. The symptoms are relieved by nothing. He has tried nothing for the symptoms.   Past Medical History   Diagnosis  Date   .  Depression    .  Hypertension    .  Hepatitis C      Patient has failed therapy in the past and has never been treated for hepatitis C because of particular mutant that he is suffering from.   [name]  GERD (gastroesophageal reflux disease)    .  Arthritis    .  H. pylori infection      history of    Past Surgical History   Procedure  Date   .  Rotator cuff repair    .  Closed reduction, percutaneous pinning of right 5th finger,    .  Left knee surgery    .   Colonoscopy  10/09     by [place]: showed sigmoid Diverticulosis    Family History   Problem  Relation  Age of Onset   .  Hypertension  Mother    .  Hypertension  Father     History   Substance Use Topics   .  Smoking status:  Never Smoker   .  Smokeless tobacco:  Not on file   .  Alcohol Use:  No     Review of Systems  Unable to perform ROS: Other  Constitutional:  Do to urgency of time with a possible stroke, the full review of systems was not able to be completed.  Respiratory: Negative for shortness of breath.  Cardiovascular: Negative for chest pain.  Gastrointestinal: Negative for abdominal pain.  Musculoskeletal: Positive for extremity weakness.  Neurological: Negative for headaches.   Allergies   Penicillins  Home Medications    Current Outpatient Rx   Name  Route  Sig  Dispense  Refill   .  AMLODIPINE BESYLATE 10 MG PO TABS  Oral  Take 1 tablet (10 mg total) by mouth daily.  30 tablet  6   .  ATENOLOL 100 MG PO TABS  Oral  Take 1 tablet (100 mg total) by mouth daily.  30 tablet  6   .  DESLORATADINE 5 MG  PO TABS  Oral  Take 1 tablet (5 mg total) by mouth daily.  90 tablet  3   .  ESOMEPRAZOLE MAGNESIUM 40 MG PO CPDR  Oral  Take 1 capsule (40 mg total) by mouth daily before breakfast.  30 capsule  6   .  HYDROCHLOROTHIAZIDE 25 MG PO TABS  Oral  Take 1 tablet (25 mg total) by mouth daily.  30 tablet  6   .  MENS MULTI VITAMIN & MINERAL PO TABS  Oral  Take 1 tablet by mouth daily.     .  QUINAPRIL HCL 20 MG PO TABS  Oral  Take 1 tablet (20 mg total) by mouth 2 (two) times daily.  60 tablet  6   .  SILDENAFIL CITRATE 100 MG PO TABS  Oral  Take 1 tablet (100 mg total) by mouth as directed.  10 tablet  3    BP 174/85  Pulse 75  Temp(Src) 98.8 F (37.1 C) (Oral)  Resp 10  SpO2 100%  Physical Exam  Nursing note and vitals reviewed.  Constitutional: He is oriented to person, place, and time. He appears well-nourished. He is active and cooperative. Non-toxic appearance.  He does not have a sickly appearance. He does not appear ill. No distress.  HENT:  Head: Normocephalic and atraumatic. Head is without raccoon's eyes, without Battle's sign and without contusion. No trismus in the jaw.  Right Ear: Hearing, tympanic membrane, external ear and ear canal normal.  Left Ear: Hearing, tympanic membrane, external ear and ear canal normal.  Nose: Nose normal.  Mouth/Throat: Uvula is midline, oropharynx is clear and moist and mucous membranes are normal. No uvula swelling. No oropharyngeal exudate.  Eyes: Conjunctivae, EOM and lids are normal. Pupils are equal, round, and reactive to light. Right eye exhibits no chemosis, no discharge and no exudate. Left eye exhibits no chemosis, no discharge and no exudate. Right conjunctiva is not injected. Left conjunctiva is not injected. Right eye exhibits normal extraocular motion and no nystagmus. Left eye exhibits normal extraocular motion and no nystagmus.  Neck: Trachea normal, normal range of motion and phonation normal. Neck supple. No JVD present. Carotid bruit is not present. No rigidity. Normal range of motion present. No Brudzinski's sign noted.  Cardiovascular: Normal rate, regular rhythm, S1 normal, S2 normal, intact distal pulses and normal pulses. Exam reveals no gallop and no friction rub.  Pulmonary/Chest: Effort normal and breath sounds normal. No accessory muscle usage. Not tachypneic. No respiratory distress. He has no decreased breath sounds. He has no wheezes. He has no rhonchi. He has no rales.  Abdominal: Soft. Normal appearance, normal aorta and bowel sounds are normal. He exhibits no pulsatile midline mass. There is no tenderness. There is no rigidity, no rebound and no guarding.  Neurological: He is alert and oriented to person, place, and time. He is not disoriented. He displays no tremor and normal reflexes. A cranial nerve deficit is present. No sensory deficit. He exhibits normal muscle tone. He displays no  seizure activity. Coordination normal. GCS eye subscore is 4. GCS verbal subscore is 5. GCS motor subscore is 6. He displays no Babinski's sign on the right side. He displays no Babinski's sign on the left side.  Reflex Scores:  Tricep reflexes are 1+ on the right side and 1+ on the left side.  Bicep reflexes are 1+ on the right side and 1+ on the left side.  Brachioradialis reflexes are 1+ on the right side and 1+ on  the left side.  Patellar reflexes are 1+ on the right side and 1+ on the left side.  Achilles reflexes are 1+ on the right side and 1+ on the left side. The patient's scores 4 on the NIH stroke scale, one point each for right facial droop, right pronator drift, right lower extremity drift, and dysarthria. He has no aphasia.  Skin: Skin is warm, dry and intact. He is not diaphoretic. No pallor.  Psychiatric: He has a normal mood and affect. His speech is normal and behavior is normal. Thought content normal. Cognition and memory are normal.   ED Course   Procedures (including critical care time)  Date: 01/27/2011  Rate: 76  Rhythm: normal sinus rhythm  QRS Axis: normal  Intervals: normal  ST/T Wave abnormalities: normal  Conduction Disutrbances:none  Narrative Interpretation: Non-provocative EKG  Old EKG Reviewed: unchanged   Labs Reviewed   I-STAT, CHEM 8   PROTIME-INR   APTT   CBC   DIFFERENTIAL   COMPREHENSIVE METABOLIC PANEL   CK TOTAL AND CKMB   TROPONIN I   POCT CBG MONITORING   URINALYSIS, ROUTINE W REFLEX MICROSCOPIC    Subthalamic hemorrhage  MDM   Acute stroke, TIA, electrolyte abnormality, myocardial infarction, anemia, arrhythmia are all entertained in the patient's differential diagnosis amongst other potential etiologies of his symptoms. At this time with the abrupt changes this morning and the findings on physical exam, a stroke is suggested. Evaluated in conjunction with Dr Doy Mince from neurology who will admit patient.  May be candidate for trial  otherwise will tx BP.  Received from Dr Windle Guard who transferred pt from [facility] ED  Trisha Mangle, MD 01/27/11 [phone]

## 2011-01-27 NOTE — ED Notes (Signed)
15mg  cardene  top limit for study

## 2011-01-27 NOTE — ED Notes (Signed)
Lab states that they can't see the orders for blood work, placed orders again

## 2011-01-28 ENCOUNTER — Inpatient Hospital Stay (HOSPITAL_COMMUNITY): Payer: Self-pay

## 2011-01-28 ENCOUNTER — Encounter (HOSPITAL_COMMUNITY): Payer: Self-pay | Admitting: Radiology

## 2011-01-28 LAB — CBC
HCT: 47.8 % (ref 39.0–52.0)
Hemoglobin: 16.4 g/dL (ref 13.0–17.0)
MCH: 29.9 pg (ref 26.0–34.0)
MCHC: 34.3 g/dL (ref 30.0–36.0)
MCV: 87.1 fL (ref 78.0–100.0)
RBC: 5.49 MIL/uL (ref 4.22–5.81)

## 2011-01-28 LAB — COMPREHENSIVE METABOLIC PANEL
ALT: 50 U/L (ref 0–53)
AST: 44 U/L — ABNORMAL HIGH (ref 0–37)
Albumin: 3.7 g/dL (ref 3.5–5.2)
CO2: 27 mEq/L (ref 19–32)
Calcium: 9.6 mg/dL (ref 8.4–10.5)
Chloride: 101 mEq/L (ref 96–112)
GFR calc non Af Amer: 57 mL/min — ABNORMAL LOW (ref >90)
Sodium: 138 mEq/L (ref 135–145)

## 2011-01-28 MED ORDER — STUDY - SODIUM CHLORIDE 0.9% IV SOLN
1.0000 mg/h | Status: AC
  Filled 2011-01-28: qty 20

## 2011-01-28 NOTE — Progress Notes (Signed)
Occupational Therapy Evaluation Patient Details Name: Edward Morgan MRN: [id number] DOB: 1950/10/06 Today's Date: 01/28/2011  Problem List:  Patient Active Problem List  Diagnoses  . HEPATITIS C  . ERECTILE DYSFUNCTION  . HYPERTENSION  . ALLERGIC RHINITIS, CHRONIC  . GERD  . RENAL INSUFFICIENCY  . OSTEOARTHRITIS  . BACK PAIN  . PROTEINURIA, MILD  . Lateral epicondylitis of left elbow  . Left elbow pain  . Diarrhea  . Intracerebral hemorrhage    Past Medical History:  Past Medical History  Diagnosis Date  . Depression   . Hypertension   . 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  [name] GERD (gastroesophageal reflux disease)   . Arthritis   . H. pylori infection     history of  . Shortness of breath   . Stroke 01/27/2011   Past Surgical History:  Past Surgical History  Procedure Date  . Rotator cuff repair   . Closed reduction, percutaneous pinning of right 5th finger,   . Left knee surgery   . Colonoscopy 10/09    by [place]: showed sigmoid Diverticulosis    OT Assessment/Plan/Recommendation OT Assessment Clinical Impression Statement: Patient with basal ganglia infarct and presents with expressive difficulties as well as RUE fine motor deficits. Will benefit from acute OT to maximize independence with ADL and ADL mobility to Mod I/I level upon d/c home. OT Recommendation/Assessment: Patient will need skilled OT in the acute care venue OT Problem List: Decreased strength;Impaired balance (sitting and/or standing);Decreased knowledge of precautions;Impaired UE functional use OT Therapy Diagnosis : Generalized weakness (expressive difficulties and fine motor deficits) OT Plan OT Frequency: Min 2X/week OT Treatment/Interventions: Self-care/ADL training;Therapeutic exercise;Neuromuscular education;Patient/family education;Therapeutic activities OT  Recommendation Follow Up Recommendations: Outpatient OT (pending patient progess) Equipment Recommended: None recommended by OT Individuals Consulted Consulted and Agree with Results and Recommendations: Patient;Family member/caregiver Family Member Consulted: wife OT Goals Acute Rehab OT Goals OT Goal Formulation: With patient/family ADL Goals Pt Will Perform Upper Body Dressing: Independently;Sitting, bed ADL Goal: Upper Body Dressing - Progress: Other (comment) Pt Will Perform Lower Body Dressing: Independently;Sit to stand from bed ADL Goal: Lower Body Dressing - Progress: Other (comment) Pt Will Perform Tub/Shower Transfer: Tub transfer;Independently ADL Goal: Tub/Shower Transfer - Progress: Other (comment) Arm Goals Additional Arm Goal #1: Independently perform RUE fine motor HEP in prep for ADLs. Arm Goal: Additional Goal #1 - Progress: Other (comment)  OT Evaluation Precautions/Restrictions  Precautions Precautions: Fall Required Braces or Orthoses: No Restrictions Weight Bearing Restrictions: No Prior Functioning Home Living Lives With: Spouse Type of Home: House Bathroom Shower/Tub: Tub/shower unit;Door [employer] Toilet: Standard Home Adaptive Equipment: None Prior Function Level of Independence: Independent with basic ADLs;Independent with homemaking with ambulation Driving: Yes Vocation: Retired ADL ADL Eating/Feeding: Simulated;Independent Where Assessed - Eating/Feeding: Chair Grooming: Simulated;Minimal assistance (Min guard assist for standing balance) Where Assessed - Grooming: Standing at sink Upper Body Dressing: Simulated;Supervision/safety;Set up Where Assessed - Upper Body Dressing: Sitting, bed Lower Body Dressing: Simulated;Minimal assistance Where Assessed - Lower Body Dressing: Sit to stand from bed Toilet Transfer: Simulated;Minimal assistance Toilet Transfer Method: Ambulating (simulated EOB to chair) Vision/Perception  Vision -  History Baseline Vision: Wears glasses all the time Patient Visual Report: No change from baseline Vision - Assessment Eye Alignment: Within Functional Limits Vision Assessment: Vision not tested Sensation/Coordination Sensation Light Touch: Appears Intact Coordination Gross Motor Movements  are Fluid and Coordinated: Yes Fine Motor Movements are Fluid and Coordinated: No (right hand) Extremity Assessment RUE Assessment RUE Assessment: Exceptions to [facility] RUE Strength Gross Grasp: Impaired (~4/5) LUE Assessment LUE Assessment: Within Functional Limits Mobility  Min guard assist with ambulation  see PT note for details.  End of Session OT - End of Session Equipment Utilized During Treatment: Gait belt Activity Tolerance: Patient tolerated treatment well Patient left: in chair;with call bell in reach;with family/visitor present Nurse Communication: Mobility status for ambulation;Mobility status for transfers General Behavior During Session: [facility] for tasks performed Cognition: Impaired Cognitive Impairment: Patient with expressive difficulties (trouble finding words and correctly, audilby expressing them)   Edward Morgan 01/28/2011, 12:05 PM

## 2011-01-28 NOTE — Progress Notes (Signed)
Physical Therapy Evaluation Patient Details Name: Edward Morgan MRN: [id number] DOB: Jul 03, 1950 Today's Date: 01/28/2011  Problem List:  Patient Active Problem List  Diagnoses  . HEPATITIS C  . ERECTILE DYSFUNCTION  . HYPERTENSION  . ALLERGIC RHINITIS, CHRONIC  . GERD  . RENAL INSUFFICIENCY  . OSTEOARTHRITIS  . BACK PAIN  . PROTEINURIA, MILD  . Lateral epicondylitis of left elbow  . Left elbow pain  . Diarrhea  . Intracerebral hemorrhage    Past Medical History:  Past Medical History  Diagnosis Date  . Depression   . Hypertension   . 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  [name] GERD (gastroesophageal reflux disease)   . Arthritis   . H. pylori infection     history of  . Shortness of breath   . Stroke 01/27/2011   Past Surgical History:  Past Surgical History  Procedure Date  . Rotator cuff repair   . Closed reduction, percutaneous pinning of right 5th finger,   . Left knee surgery   . Colonoscopy 10/09    by [place]: showed sigmoid Diverticulosis    PT Assessment/Plan/Recommendation PT Assessment Clinical Impression Statement: Pt is a 60 y/o male admitted with CVA and the below PT problem list.  Pt would benefit from PT acutely to maximize independence and facilitate d/c home. PT Recommendation/Assessment: Patient will need skilled PT in the acute care venue PT Problem List: Decreased activity tolerance;Decreased balance;Decreased mobility;Decreased cognition Barriers to Discharge: None PT Therapy Diagnosis : Difficulty walking;Abnormality of gait PT Plan PT Frequency: Min 4X/week PT Treatment/Interventions: Gait training;Functional mobility training;Balance training;Patient/family education;Therapeutic activities;Neuromuscular re-education PT Recommendation Follow Up Recommendations: Outpatient PT Equipment Recommended: None recommended by OT;  None recommended by PT. PT Goals  Acute Rehab PT Goals PT Goal Formulation: With patient/family Time For Goal Achievement: 7 days Pt will go Supine/Side to Sit: Independently PT Goal: Supine/Side to Sit - Progress: Other (comment) (Set today.) Pt will go Sit to Supine/Side: Independently PT Goal: Sit to Supine/Side - Progress: Other (comment) (Set today.) Pt will go Sit to Stand: with modified independence PT Goal: Sit to Stand - Progress: Other (comment) (Set today.) Pt will go Stand to Sit: with modified independence PT Goal: Stand to Sit - Progress: Other (comment) (Set today.) Pt will Ambulate: >150 feet;with modified independence;with least restrictive assistive device PT Goal: Ambulate - Progress: Other (comment) (Set today.)  PT Evaluation Precautions/Restrictions  Precautions Precautions: Fall Required Braces or Orthoses: No Restrictions Weight Bearing Restrictions: No Prior Functioning  Home Living Lives With: Spouse Receives Help From: Other (Comment) (PRN) Type of Home: House Home Layout: One level Home Access: Level entry Bathroom Shower/Tub: Tub/shower unit;Door [employer] Toilet: Standard Home Adaptive Equipment: None Prior Function Level of Independence: Independent with basic ADLs;Independent with homemaking with ambulation Able to Take Stairs?: Yes Driving: Yes Vocation: Retired Artist: Awake/alert Overall Cognitive Status: Appears within functional limits for tasks assessed Orientation Level: Oriented X4 Sensation/Coordination Sensation Light Touch: Appears Intact Stereognosis: Not tested Hot/Cold: Not tested Proprioception: Not tested Coordination Gross Motor Movements are Fluid and Coordinated: Yes Fine Motor Movements are Fluid and Coordinated: No (right hand) Extremity Assessment RUE Assessment RUE Assessment: Exceptions to [facility] RUE Strength Gross Grasp: Impaired (~4/5) LUE Assessment LUE Assessment: Within  Functional Limits RLE Assessment RLE Assessment: Within Functional Limits LLE Assessment LLE Assessment: Within Functional Limits Pain 0/10  with evaluation. Mobility (including Balance) Bed Mobility Bed Mobility: Yes Supine to Sit: 6: Modified independent (Device/Increase time) Transfers Transfers: Yes Sit to Stand: 4: Min assist (Min (guard)) Sit to Stand Details (indicate cue type and reason): Guarding for balance with cues for safety. Stand to Sit: 4: Min assist (Min (guard)) Stand to Sit Details: Guarding for balance with verbal cues for safety. Ambulation/Gait Ambulation/Gait: Yes Ambulation/Gait Assistance: 4: Min assist Ambulation/Gait Assistance Details (indicate cue type and reason): Assist for balance with cues for safety. Ambulation Distance (Feet): 180 Feet Assistive device: None Gait Pattern: Decreased step length - right;Decreased step length - left Stairs: No Wheelchair Mobility Wheelchair Mobility: No  Posture/Postural Control Posture/Postural Control: No significant limitations Balance Balance Assessed: No Exercise    End of Session PT - End of Session Equipment Utilized During Treatment: Gait belt Activity Tolerance: Patient tolerated treatment well Patient left: in chair;with call bell in reach;with family/visitor present Nurse Communication: Mobility status for transfers;Mobility status for ambulation General Behavior During Session: [facility] for tasks performed Cognition: Impaired Cognitive Impairment: Patient with expressive difficulties (trouble finding words and correctly, audilby expressing them) Co-evaluation with OT.  Kj Imbert M 01/28/2011, 2:03 PM  01/28/2011 Cyndia Bent, PT, DPT [phone]

## 2011-01-28 NOTE — Progress Notes (Signed)
Stroke Team Progress Note  SUBJECTIVE  Edward Morgan is a 60 y.o. male whose stroke symptoms occurred 1 day ago Wife was with pt at the gym and drove him to [facility] hospital. He presented with symptoms of right extremity weakness  and slurred speech. CT showed small basal ganglia hemorrhage. He was enrolled in the ATACH-II trial (use of cardene in acute hmg).  His wife and ? Daughter are at the bedside. Overall he feels his condition is gradually improving.   OBJECTIVE Most recent Vital Signs: Temp: 98.7 F (37.1 C) (12/04 0818) Temp src: Oral (12/04 0818) BP: 147/74 mmHg (12/04 0818) Pulse Rate: 74  (12/04 0818) Respiratory Rate: 16 O2 Saturdation: 100%  CBG (last 3)   Basename 01/27/11 2000  GLUCAP 124*   Intake/Output from previous day: 12/03 0701 - 12/04 0700 In: 1327.5 [P.O.:560; I.V.:360; IV Piggyback:407.5] Out: 1400 [Urine:1400]  IV Fluid Intake:      . ATACH-II Trial - nicardipine 20 mg/200 mL  (0.1 mg/mL) - Intensive treatment:  SBP range 110-140  (PI-Sethi) 3 mg/hr (01/28/11 0820)  . DISCONTD: niCARDipine     Diet:  General thin liquids  Activity:  Bedrest  DVT Prophylaxis:  SCDs   Studies: CBC    Component Value Date/Time   WBC 5.7 01/28/2011 0536   RBC 5.49 01/28/2011 0536   HGB 16.4 01/28/2011 0536   HCT 47.8 01/28/2011 0536   PLT 197 01/28/2011 0536   MCV 87.1 01/28/2011 0536   MCH 29.9 01/28/2011 0536   MCHC 34.3 01/28/2011 0536   RDW 13.0 01/28/2011 0536   LYMPHSABS 0.9 01/27/2011 0936   MONOABS 0.7 01/27/2011 0936   EOSABS 0.1 01/27/2011 0936   BASOSABS 0.0 01/27/2011 0936   CMP    Component Value Date/Time   NA 138 01/28/2011 0536   K 3.9 01/28/2011 0536   CL 101 01/28/2011 0536   CO2 27 01/28/2011 0536   GLUCOSE 105* 01/28/2011 0536   BUN 17 01/28/2011 0536   CREATININE 1.30 01/28/2011 0536   CREATININE 1.65* 05/24/2010 1053   CALCIUM 9.6 01/28/2011 0536   PROT 7.6 01/28/2011 0536   ALBUMIN 3.7 01/28/2011 0536   AST 44* 01/28/2011 0536   ALT 50 01/28/2011  0536   ALKPHOS 87 01/28/2011 0536   BILITOT 1.0 01/28/2011 0536   GFRNONAA 57* 01/28/2011 0536   GFRAA 66* 01/28/2011 0536   COAGS Lab Results  Component Value Date   INR 0.99 01/27/2011   INR 1.19 01/25/2009   INR 1.0 07/21/2008   Lipid Panel    Component Value Date/Time   CHOL 163 12/18/2009 1904   TRIG 120 12/18/2009 1904   HDL 34* 12/18/2009 1904   CHOLHDL 4.8 Ratio 12/18/2009 1904   VLDL 24 12/18/2009 1904   LDLCALC 105* 12/18/2009 1904   HgbA1C  No results found for this basename: HGBA1C   Urine Drug Screen    Component Value Date/Time   LABOPIA POSITIVE* 01/25/2009 1100   COCAINSCRNUR NONE DETECTED 01/25/2009 1100   LABBENZ NONE DETECTED 01/25/2009 1100   AMPHETMU NONE DETECTED 01/25/2009 1100   THCU NONE DETECTED 01/25/2009 1100   LABBARB  Value: NONE DETECTED        DRUG SCREEN FOR MEDICAL PURPOSES ONLY.  IF CONFIRMATION IS NEEDED FOR ANY PURPOSE, NOTIFY LAB WITHIN 5 DAYS.        LOWEST DETECTABLE LIMITS FOR URINE DRUG SCREEN Drug Class       Cutoff (ng/mL) Amphetamine      1000 Barbiturate  200 Benzodiazepine   [id number] Tricyclics       [id number] Opiates          300 Cocaine          300 THC              50 01/25/2009 1100    Alcohol Level    Component Value Date/Time   ETH  Value: <5        LOWEST DETECTABLE LIMIT FOR SERUM ALCOHOL IS 5 mg/dL FOR MEDICAL PURPOSES ONLY 01/25/2009 1105   CT of the brain  01/28/2011   Minimal if any change left basal ganglia hematoma as noted above.   01/27/2011 1.  Left basal ganglia hemorrhage within the lentiform nucleus measures 10 x 23 x 20 mm. 2.  Additional punctate hemorrhage may be present within the internal capsule. 3.  Mild surrounding edema without significant mass effect. 4.  Potential ischemic infarct versus edema within the lateral left thalamus.    MRI of the brain  Not ordered  MRA of the brain  Not ordered  CXR  Not done  EKG  Not done  Physical Exam    Afebrile.Neck is supple without bruit. Hearing is normal. Cardiac exam no  murmur or gallop. Lungs are clear to auscultation. There is no pedal edema. Distal pulses are well felt..  Neurological exam awake alert oriented x3 with normal speech and language function. No aphasia or dysarthria. Eye movements are full range without nystagmus. Visual fields are full to confrontational testing. There is minimal on the right lower facial weakness. Tongue is midline. Hearing is normal.  Motor system exam reveals no upper or lower extremity drift. Symmetric and equal strength in all 4 extremities. No focal weakness. Diminished fine finger movements on the right. Orbits left over right upper extremity. Grip strength is symmetric. Touch and pinprick sensation are preserved. Finger to nose and knee to heel coordination accurate. Plantars are downgoing. Gait was not tested.  NIH stroke scale score 1. ASSESSMENT Mr. Edward Morgan is a 60 y.o. male with a small basal ganglia hemorrhage, secondary to hypertension.Patient has participated in the [facility] 2 study and was randomized to the aggressive blood pressure lowering arm. Stroke risk factors:  hypertension and previous stroke  Hospital day # 1  TREATMENT/PLAN OOB. Therapy evals. Wean cardene.BP goal SBP below 160. MRI/MRA brain.D/w patient, wife and daughter.  Steffanie Rainwater, ANP-BC, GNP-BC [facility] Pager: [phone] 01/28/2011 9:10 AM  Dr. Antony Contras, [place] Director, has personally reviewed chart, pertinent data, examined the patient and developed the plan of care.

## 2011-01-29 DIAGNOSIS — E669 Obesity, unspecified: Secondary | ICD-10-CM | POA: Diagnosis present

## 2011-01-29 LAB — COMPREHENSIVE METABOLIC PANEL
BUN: 19 mg/dL (ref 6–23)
CO2: 25 mEq/L (ref 19–32)
Calcium: 9.4 mg/dL (ref 8.4–10.5)
Chloride: 102 mEq/L (ref 96–112)
Creatinine, Ser: 1.43 mg/dL — ABNORMAL HIGH (ref 0.50–1.35)
GFR calc Af Amer: 59 mL/min — ABNORMAL LOW (ref >90)
GFR calc non Af Amer: 51 mL/min — ABNORMAL LOW (ref >90)
Glucose, Bld: 119 mg/dL — ABNORMAL HIGH (ref 70–99)
Total Bilirubin: 0.6 mg/dL (ref 0.3–1.2)

## 2011-01-29 LAB — CBC
Hemoglobin: 16.3 g/dL (ref 13.0–17.0)
MCH: 30.7 pg (ref 26.0–34.0)
Platelets: 207 10*3/uL (ref 150–400)
RBC: 5.31 MIL/uL (ref 4.22–5.81)
WBC: 5.3 10*3/uL (ref 4.0–10.5)

## 2011-01-29 NOTE — Discharge Summary (Signed)
Stroke Discharge Summary  Patient ID: Edward Morgan   MRN: [id number]      DOB: 12-24-1950  Date of Admission: 01/27/2011 Date of Discharge: 01/29/2011  Attending Physician:  Suzzanne Cloud, MD  Consulting Physician(s):     none  Patient's PCP:  Rich Reining, MD, MD  Discharge Diagnoses:   *Intracerebral hemorrhage - small basal ganglia hemorrhage, secondary to hypertension.Patient has participated in the [facility] 2 study and was randomized to the aggressive blood pressure lowering arm. - HYPERTENSION - Obesity. BMI: Body mass index is 31.66 kg/(m^2).   Past Medical History  Diagnosis Date  . Depression   . Hypertension   . 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  [name] GERD (gastroesophageal reflux disease)   . Arthritis   . H. pylori infection     history of  . Shortness of breath   . Stroke 01/27/2011   Past Surgical History  Procedure Date  . Rotator cuff repair   . Closed reduction, percutaneous pinning of right 5th finger,   . Left knee surgery   . Colonoscopy 10/09    by [place]: showed sigmoid Diverticulosis    Current Discharge Medication List    CONTINUE these medications which have NOT CHANGED   Details  amLODipine (NORVASC) 10 MG tablet Take 1 tablet (10 mg total) by mouth daily. Qty: 30 tablet, Refills: 6   Associated Diagnoses: Unspecified essential hypertension    atenolol (TENORMIN) 100 MG tablet Take 1 tablet (100 mg total) by mouth daily. Qty: 30 tablet, Refills: 6   Associated Diagnoses: Unspecified essential hypertension    esomeprazole (NEXIUM) 40 MG capsule Take 1 capsule (40 mg total) by mouth daily before breakfast. Qty: 30 capsule, Refills: 6   Associated Diagnoses: Esophageal reflux    hydrochlorothiazide 25 MG tablet Take 1 tablet (25 mg total) by mouth daily. Qty: 30 tablet, Refills: 6   Associated Diagnoses:  Unspecified essential hypertension    lisinopril (PRINIVIL,ZESTRIL) 20 MG tablet Take 20 mg by mouth 3 (three) times daily.      Multiple Vitamins-Minerals (MENS MULTI VITAMIN & MINERAL) TABS Take 1 tablet by mouth daily.         Significant Diagnostic Studies:   CT of the brain  01/28/2011 Minimal if any change left basal ganglia hematoma as noted above.  01/27/2011 1. Left basal ganglia hemorrhage within the lentiform nucleus measures 10 x 23 x 20 mm. 2. Additional punctate hemorrhage may be present within the internal capsule. 3. Mild surrounding edema without significant mass effect. 4. Potential ischemic infarct versus edema within the lateral left thalamus.  MRI of the brain Hemorrhagic infarction affecting the left basal ganglia. The hematoma measures 23 x 13 mm by MR. Small hemorrhagic infarction affecting the posterior body of the caudate on the left. Moderate chronic small vessel changes elsewhere throughout the brain.  MRA of the brain No major vessel occlusion. 50% stenosis of the right middle cerebral artery in the 1 day to junction region. Moderate stenosis of the proximal right PCA, probably 50% to 60%  CXR canceled  EKG normal sinus rhythm   Laboratory Studies CBC    Component Value Date/Time   WBC 5.3 01/29/2011 0342   RBC 5.31 01/29/2011 0342   HGB 16.3 01/29/2011 0342   HCT 46.4 01/29/2011 0342   PLT 207 01/29/2011 0342  MCV 87.4 01/29/2011 0342   MCH 30.7 01/29/2011 0342   MCHC 35.1 01/29/2011 0342   RDW 13.1 01/29/2011 0342   LYMPHSABS 0.9 01/27/2011 0936   MONOABS 0.7 01/27/2011 0936   EOSABS 0.1 01/27/2011 0936   BASOSABS 0.0 01/27/2011 0936   CMP    Component Value Date/Time   NA 137 01/29/2011 0342   K 3.5 01/29/2011 0342   CL 102 01/29/2011 0342   CO2 25 01/29/2011 0342   GLUCOSE 119* 01/29/2011 0342   BUN 19 01/29/2011 0342   CREATININE 1.43* 01/29/2011 0342   CREATININE 1.65* 05/24/2010 1053   CALCIUM 9.4 01/29/2011 0342   PROT 7.0 01/29/2011 0342   ALBUMIN 3.3*  01/29/2011 0342   AST 39* 01/29/2011 0342   ALT 47 01/29/2011 0342   ALKPHOS 81 01/29/2011 0342   BILITOT 0.6 01/29/2011 0342   GFRNONAA 51* 01/29/2011 0342   GFRAA 59* 01/29/2011 0342   COAGS Lab Results  Component Value Date   INR 0.99 01/27/2011   INR 1.19 01/25/2009   INR 1.0 07/21/2008   Lipid Panel    Component Value Date/Time   CHOL 163 12/18/2009 1904   TRIG 120 12/18/2009 1904   HDL 34* 12/18/2009 1904   CHOLHDL 4.8 Ratio 12/18/2009 1904   VLDL 24 12/18/2009 1904   LDLCALC 105* 12/18/2009 1904   HgbA1C  No results found for this basename: HGBA1C   Urine Drug Screen     Component Value Date/Time   LABOPIA POSITIVE* 01/25/2009 1100   COCAINSCRNUR NONE DETECTED 01/25/2009 1100   LABBENZ NONE DETECTED 01/25/2009 1100   AMPHETMU NONE DETECTED 01/25/2009 1100   THCU NONE DETECTED 01/25/2009 1100   LABBARB  Value: NONE DETECTED        DRUG SCREEN FOR MEDICAL PURPOSES ONLY.  IF CONFIRMATION IS NEEDED FOR ANY PURPOSE, NOTIFY LAB WITHIN 5 DAYS.        LOWEST DETECTABLE LIMITS FOR URINE DRUG SCREEN Drug Class       Cutoff (ng/mL) Amphetamine      1000 Barbiturate      200 Benzodiazepine   [id number] Tricyclics       [id number] Opiates          300 Cocaine          300 THC              50 01/25/2009 1100    Alcohol Level    Component Value Date/Time   ETH  Value: <5        LOWEST DETECTABLE LIMIT FOR SERUM ALCOHOL IS 5 mg/dL FOR MEDICAL PURPOSES ONLY 01/25/2009 1105    History of Present Illness  Edward Morgan is an 60 y.o. male that woke up normal this morning. Went to the gym and on leaving the gym at 730am noted that he was dragging the right leg. Was noted to have slurred speech as well. Patient presented to [facility] ED where he was found to have a right facial droop, right sided numbness and weakness/slurred speech. CT performed and patient transferred to [facility] ED. Initial NIHSS of 4. Head shows left BG hemorrhage. BP elevated. He was found to be an ATACH-II trial candidate and was enrolled; he was  randomized to the aggressive blood pressure control arm. He was admitted to the neuro ICU for further treatment and evaluation.   Hospital Course: Patient's blood pressure normalized quickly on Cardene. Within 24 hours he was totally off the drip. He was changed back to his home blood pressure  medicines without difficulty, with good control. Imaging showed no increase in the size of hemorrhage.  PT and OT and speech therapy evaluated the patient. They felt he was safe for outpatient therapies. Case management was consulted to arrange. Patient has been advised to avoid heavy lifting or excessive straining at the gym for now. His family will take care of him at home. He has been approved for discharge.  Patient has stroke risk factors of hypertension and obesity  Discharge Exam  Blood pressure 139/73, pulse 66, temperature 99.2 F (37.3 C), temperature source Oral, resp. rate 17, height 6' (1.829 m), weight 105.9 kg (233 lb 7.5 oz), SpO2 100.00%. Afebrile.Neck is supple without bruit. Hearing is normal. There is no pedal edema. Distal pulses are well felt..  Neurological exam awake alert oriented x3 with normal speech and language function. No aphasia or dysarthria. Eye movements are full range without nystagmus. Visual fields are full to confrontational testing. There is now no right lower facial weakness. Tongue is midline. Hearing is normal.  Motor system exam reveals no upper or lower extremity drift. Symmetric and equal strength in all 4 extremities. No focal weakness. Diminished fine finger movements on the right. Orbits left over right upper extremity. Grip strength is symmetric. Touch and pinprick sensation are preserved. Finger to nose and knee to heel coordination accurate. Plantars are downgoing. Gait was not tested but he was able to ambulate earlier with therapist..  Discharge Diet   General thin liquids  Discharge Plan   - Disposition:  Home - Outpatient PT OT and speech therapy -  Ongoing risk factor control by Primary Care Physician. - Risk factor recommendations:  Hypertension target range 130-140/70-80  - Follow-up Rich Reining, MD, MD in 1 month. - Follow-up with Dr. Antony Contras in 2 months.  Signed Burnetta Sabin, AVNP, ANP-BC, [facility] Nurse Practitioner 01/29/2011, 2:26 PM  Dr. Antony Contras, [place] Director, has personally reviewed chart, pertinent data, examined the patient and developed the plan of care.

## 2011-01-29 NOTE — Progress Notes (Signed)
CARE MANAGEMENT NOTE 01/29/2011 Spoke with patient and wife. Outpatient PT/OT and speech to be arranged. verified phone number. [phone], phone [phone]. They will contact patient with appointment date and time.

## 2011-01-29 NOTE — Progress Notes (Signed)
Stroke Team Progress Note  SUBJECTIVE  Edward Morgan is a 60 y.o. male whose stroke symptoms occurred 2 days ago. He presented with symptoms of extremity weakness  and slurred speech.  His wife and ? Daughter are at the bedside. Overall he feels his condition is rapidly improving. He is anxious for discharge today.he has been of cardene drip for more than 24 hrs. He had MRI yesterday which showed stable 2.3 x 1.3 cm left basal ganglia bleed.  OBJECTIVE Most recent Vital Signs: Temp: 99.4 F (37.4 C) (12/05 0339) Temp src: Oral (12/05 0339) BP: 121/70 mmHg (12/05 0700) Pulse Rate: 60  (12/05 0700) Respiratory Rate: 17 O2 Saturdation: 100%  CBG (last 3)   Basename 01/27/11 2000  GLUCAP 124*   Intake/Output from previous day: 12/04 0701 - 12/05 0700 In: 200 [P.O.:120; IV Piggyback:80] Out: 600 [Urine:600]  IV Fluid Intake:  none  Diet:  General thin liquids  Activity:  Up with assistance  DVT Prophylaxis:  SCDs   Studies: CBC    Component Value Date/Time   WBC 5.3 01/29/2011 0342   RBC 5.31 01/29/2011 0342   HGB 16.3 01/29/2011 0342   HCT 46.4 01/29/2011 0342   PLT 207 01/29/2011 0342   MCV 87.4 01/29/2011 0342   MCH 30.7 01/29/2011 0342   MCHC 35.1 01/29/2011 0342   RDW 13.1 01/29/2011 0342   LYMPHSABS 0.9 01/27/2011 0936   MONOABS 0.7 01/27/2011 0936   EOSABS 0.1 01/27/2011 0936   BASOSABS 0.0 01/27/2011 0936   CMP    Component Value Date/Time   NA 137 01/29/2011 0342   K 3.5 01/29/2011 0342   CL 102 01/29/2011 0342   CO2 25 01/29/2011 0342   GLUCOSE 119* 01/29/2011 0342   BUN 19 01/29/2011 0342   CREATININE 1.43* 01/29/2011 0342   CREATININE 1.65* 05/24/2010 1053   CALCIUM 9.4 01/29/2011 0342   PROT 7.0 01/29/2011 0342   ALBUMIN 3.3* 01/29/2011 0342   AST 39* 01/29/2011 0342   ALT 47 01/29/2011 0342   ALKPHOS 81 01/29/2011 0342   BILITOT 0.6 01/29/2011 0342   GFRNONAA 51* 01/29/2011 0342   GFRAA 59* 01/29/2011 0342   COAGS Lab Results  Component Value Date   INR 0.99  01/27/2011   INR 1.19 01/25/2009   INR 1.0 07/21/2008   Lipid Panel    Component Value Date/Time   CHOL 163 12/18/2009 1904   TRIG 120 12/18/2009 1904   HDL 34* 12/18/2009 1904   CHOLHDL 4.8 Ratio 12/18/2009 1904   VLDL 24 12/18/2009 1904   LDLCALC 105* 12/18/2009 1904   HgbA1C  No results found for this basename: HGBA1C   Urine Drug Screen    Component Value Date/Time   LABOPIA POSITIVE* 01/25/2009 1100   COCAINSCRNUR NONE DETECTED 01/25/2009 1100   LABBENZ NONE DETECTED 01/25/2009 1100   AMPHETMU NONE DETECTED 01/25/2009 1100   THCU NONE DETECTED 01/25/2009 1100   LABBARB  Value: NONE DETECTED        DRUG SCREEN FOR MEDICAL PURPOSES ONLY.  IF CONFIRMATION IS NEEDED FOR ANY PURPOSE, NOTIFY LAB WITHIN 5 DAYS.        LOWEST DETECTABLE LIMITS FOR URINE DRUG SCREEN Drug Class       Cutoff (ng/mL) Amphetamine      1000 Barbiturate      200 Benzodiazepine   [id number] Tricyclics       [id number] Opiates          300 Cocaine  300 THC              50 01/25/2009 1100    Alcohol Level    Component Value Date/Time   ETH  Value: <5        LOWEST DETECTABLE LIMIT FOR SERUM ALCOHOL IS 5 mg/dL FOR MEDICAL PURPOSES ONLY 01/25/2009 1105    CT of the brain   01/28/2011  Minimal if any change left basal ganglia hematoma as noted above.   01/27/2011  1.  Left basal ganglia hemorrhage within the lentiform nucleus measures 10 x 23 x 20 mm. 2.  Additional punctate hemorrhage may be present within the internal capsule. 3.  Mild surrounding edema without significant mass effect. 4.  Potential ischemic infarct versus edema within the lateral left thalamus.    MRI of the brain   Hemorrhagic infarction affecting the left basal ganglia.  The hematoma measures 23 x 13 mm by MR.  Small hemorrhagic infarction affecting the posterior body of the caudate on the left.  Moderate chronic small vessel changes elsewhere throughout the brain.   MRA of the brain  No major vessel occlusion.  50% stenosis of the right middle cerebral  artery in the 1 day to junction region.  Moderate stenosis of the proximal right PCA, probably 50% to 60%   CXR  canceled  EKG  Not ordered.   Physical Exam  Afebrile.Neck is supple without bruit. Hearing is normal.   There is no pedal edema. Distal pulses are well felt..  Neurological exam awake alert oriented x3 with normal speech and language function. No aphasia or dysarthria. Eye movements are full range without nystagmus. Visual fields are full to confrontational testing. There is now no right lower facial weakness. Tongue is midline. Hearing is normal.  Motor system exam reveals no upper or lower extremity drift. Symmetric and equal strength in all 4 extremities. No focal weakness. Diminished fine finger movements on the right. Orbits left over right upper extremity. Grip strength is symmetric. Touch and pinprick sensation are preserved. Finger to nose and knee to heel coordination accurate. Plantars are downgoing. Gait was not tested but he was able to ambulate earlier with therapist..  ASSESSMENT Mr. Edward Morgan is a 60 y.o. male with a small basal ganglia hemorrhage, secondary to hypertension.Patient has participated in the [facility] 2 study and was randomized to the aggressive blood pressure lowering arm. Infusion now off. BP normalized. Needs OP PT & OT. Medically ready for discharge.  Stroke risk factors: hypertension and previous stroke  Hospital day # 2  TREATMENT/PLAN Discharge home. OP PT & OT. No heavy lifting or excessive straining at the gym for now. D/w patient and wife and answered questions.  Steffanie Rainwater, ANP-BC, GNP-BC [facility] Pager: [phone] 01/29/2011 9:04 AM  Dr. Antony Contras, [facility] Director, has personally reviewed chart, pertinent data, examined the patient and developed the plan of care.

## 2011-01-29 NOTE — Plan of Care (Signed)
Problem: Phase I Progression Outcomes Goal: Code status addressed with pt/family Outcome: Not Applicable Date Met:  [id number] Pt to be discharged to home today. Has been FC throughout hospitalization.

## 2011-01-29 NOTE — Progress Notes (Signed)
Physical Therapy Treatment Patient Details Name: Edward Morgan MRN: [id number] DOB: 1951-01-15 Today's Date: 01/29/2011  PT Assessment/Plan  PT - Assessment/Plan Comments on Treatment Session: Pt with great improvement today.  States he feels like he is pretty much back to his baseline with mobility.  Pt very eager to D/C home- per chart reveiw & pt, plans are for possible d/c home later today.   PT Plan: Discharge plan remains appropriate.  PT Frequency: Min 4X/week Follow Up Recommendations: Outpatient PT Equipment Recommended: None recommended by PT PT Goals  Acute Rehab PT Goals PT Goal: Sit to Stand - Progress: Met PT Goal: Stand to Sit - Progress: Met PT Goal: Ambulate - Progress: Met **Sit<>stand & ambulation goals met today but will not update goals today due to pt with possible d/c home today.  If pt does not D/C then goals will need to be revised**  PT Treatment Precautions/Restrictions  Precautions Precautions: Fall Required Braces or Orthoses: No Restrictions Weight Bearing Restrictions: No Mobility (including Balance) Bed Mobility Bed Mobility: No Transfers Sit to Stand: 6: Modified independent (Device/Increase time);From chair/3-in-1;With upper extremity assist;With armrests Stand to Sit: 6: Modified independent (Device/Increase time);To chair/3-in-1;With upper extremity assist;With armrests Ambulation/Gait Ambulation/Gait Assistance: 5: Supervision Ambulation/Gait Assistance Details (indicate cue type and reason): supervision for safety due to balance defecits in previous PT session (01/28/11)  Pt performed various gait challenges such as horizontal/vertical head turns, gait speed changes, directional changes, retropulsion, stop/go.  No LOB noted with any of these challenges.   Ambulation Distance (Feet):  (300) Assistive device: None Gait Pattern: Step-through pattern Stairs: No Wheelchair Mobility Wheelchair Mobility: No  Dynamic Standing Balance Dynamic  Standing - Balance Support: No upper extremity supported Dynamic Standing - Level of Assistance: 5: Stand by assistance Dynamic Standing - Comments: Pt performed activities such as: picking object up off floor, eyes open/closes, feet together/apart, accepting mod external perturbations.   Exercise    End of Session PT - End of Session Equipment Utilized During Treatment: Gait belt Activity Tolerance: Patient tolerated treatment well Patient left: in chair;with call bell in reach;with family/visitor present General Behavior During Session: [facility] for tasks performed Cognition: [facility] for tasks performed  Sena Hitch 01/29/2011, 11:41 AM [phone]

## 2011-01-29 NOTE — Progress Notes (Signed)
Occupational Therapy Treatment Patient Details Name: Edward Morgan MRN: [id number] DOB: 01-27-1951 Today's Date: 01/29/2011  OT Assessment/Plan OT Assessment/Plan: all goals met- d/c from acute OT OT Plan: Discharge plan needs to be updated Follow Up Recommendations: None Equipment Recommended: None recommended by OT OT Goals ADL Goals ADL Goal: Upper Body Dressing - Progress: Met ADL Goal: Lower Body Dressing - Progress: Met ADL Goal: Tub/Shower Transfer - Progress: Met Arm Goals Arm Goal: Additional Goal #1 - Progress: Met  OT Treatment Precautions/Restrictions  Precautions Precautions: Fall   Exercises  Educated patient on RUE fine motor coordination and strengthening exercises- patient verbalized and demonstrated understanding  End of Session OT - End of Session Activity Tolerance: Patient tolerated treatment well Patient left: in chair;with call bell in reach;with family/visitor present General Behavior During Session: [facility] for tasks performed Cognition: [facility] for tasks performed  Francois Elk  01/29/2011, 11:53 AM

## 2011-01-29 NOTE — Progress Notes (Signed)
Speech Language/Pathology Speech Language Pathology Evaluation Patient Details Name: Edward Morgan MRN: [id number] DOB: 1950-03-23 Today's Date: 01/29/2011  Problem List:  Patient Active Problem List  Diagnoses  . HEPATITIS C  . ERECTILE DYSFUNCTION  . HYPERTENSION  . ALLERGIC RHINITIS, CHRONIC  . GERD  . RENAL INSUFFICIENCY  . OSTEOARTHRITIS  . BACK PAIN  . PROTEINURIA, MILD  . Lateral epicondylitis of left elbow  . Left elbow pain  . Diarrhea  . Intracerebral hemorrhage  . Obesity (BMI 30-39.9)    Past Medical History:  Past Medical History  Diagnosis Date  . Depression   . Hypertension   . 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  [name] GERD (gastroesophageal reflux disease)   . Arthritis   . H. pylori infection     history of  . Shortness of breath   . Stroke 01/27/2011   Past Surgical History:  Past Surgical History  Procedure Date  . Rotator cuff repair   . Closed reduction, percutaneous pinning of right 5th finger,   . Left knee surgery   . Colonoscopy 10/09    by [place]: showed sigmoid Diverticulosis    SLP Assessment/Plan/Recommendation Assessment Clinical Impression Statement: Pt. presents with a medical diagnosis of CVA affecting the Pt's speech intelligibility and higher level expression at conversational level.Pt appears to have mild Dysarthria and moments of dysfluency during conversation.  Intermittent vowel distortions and increased rate of speech were also noted.  Pt. is sometimes aware of unintellligible speech characteristics.  Observed Pt. eat a few bites of breakfast and  he  appeared safe with current diet..  Made referral for ST in Outpatient Rehab and  discussed this with  Pt. and family SLP Recommendation/Assessment: All further Speech Lanaguage Pathology  needs can be addressed in the next venue of care No Skilled Speech  Therapy: All education completed (functional in acute venue) Plan Speech Therapy Frequency: min 1 x/week Potential to Achieve Goals: Good Potential Considerations: Financial resources Recommendation Follow up Recommendations: Outpatient SLP Equipment Recommended: None recommended by PT Individuals Consulted Consulted and Agree with Results and Recommendations: Patient;Family member/caregiver  SLP Evaluation Prior Functioning  Prior Functional Status Cognitive/Linguistic Baseline: Within functional limits Type of Home: House Lives With: Spouse Receives Help From: Other (Comment) (PRN) Vocation: Retired Public librarian Overall Cognitive Status: Appears within functional limits for tasks assessed Arousal/Alertness: Awake/alert Orientation Level: Oriented to person;Oriented to situation Attention: Focused;Sustained Focused Attention: Appears intact Sustained Attention: Appears intact Memory: Appears intact Awareness: Appears intact Problem Solving: Appears intact Executive Function: Self Monitoring;Self Correcting Self Monitoring: Appears intact Self Correcting: Appears intact Safety/Judgment: Appears intact Comprehension  Auditory Comprehension Yes/No Questions: Not tested Commands: Within Functional Limits Conversation: Complex Visual Recognition/Discrimination Discrimination: Within Function Limits Reading Comprehension Reading Status: Within funtional limits Expression  Expression Primary Mode of Expression: Verbal Verbal Expression Initiation: No impairment Level of Generative/Spontaneous Verbalization: Emergency planning/management officer: No impairment Interfering Components: Speech intelligibility Non-Verbal Means of Communication: Not applicable Written Expression Written Expression: Not tested Oral/Motor  Oral Motor/Sensory Function Labial ROM: Reduced right Labial Symmetry: Abnormal symmetry right Labial Strength: Reduced Lingual ROM: Reduced right Lingual  Symmetry: Abnormal symmetry right Lingual Strength: Reduced Facial ROM: Reduced right Facial Symmetry: Right droop Facial Strength: Reduced Velum: Within Functional Limits Mandible: Within Functional Limits  Dolphus Jenny, SLP student  Orbie Pyo Colvin Caroli.Ed [employer] [phone]   01/29/2011

## 2011-02-03 ENCOUNTER — Ambulatory Visit: Payer: Self-pay | Admitting: *Deleted

## 2011-02-03 ENCOUNTER — Ambulatory Visit: Payer: Self-pay | Attending: Nurse Practitioner

## 2011-02-03 ENCOUNTER — Ambulatory Visit: Payer: Self-pay | Admitting: Physical Therapy

## 2011-02-03 DIAGNOSIS — Z5189 Encounter for other specified aftercare: Secondary | ICD-10-CM | POA: Insufficient documentation

## 2011-02-03 DIAGNOSIS — I69920 Aphasia following unspecified cerebrovascular disease: Secondary | ICD-10-CM | POA: Insufficient documentation

## 2011-02-03 DIAGNOSIS — I69998 Other sequelae following unspecified cerebrovascular disease: Secondary | ICD-10-CM | POA: Insufficient documentation

## 2011-02-03 DIAGNOSIS — M6281 Muscle weakness (generalized): Secondary | ICD-10-CM | POA: Insufficient documentation

## 2011-02-03 DIAGNOSIS — R279 Unspecified lack of coordination: Secondary | ICD-10-CM | POA: Insufficient documentation

## 2011-02-05 ENCOUNTER — Ambulatory Visit: Payer: Self-pay | Admitting: Physical Therapy

## 2011-02-05 ENCOUNTER — Ambulatory Visit: Payer: Self-pay | Admitting: *Deleted

## 2011-02-05 ENCOUNTER — Ambulatory Visit: Payer: Self-pay

## 2011-02-07 ENCOUNTER — Other Ambulatory Visit: Payer: Self-pay | Admitting: *Deleted

## 2011-02-07 DIAGNOSIS — I10 Essential (primary) hypertension: Secondary | ICD-10-CM

## 2011-02-11 ENCOUNTER — Other Ambulatory Visit: Payer: Self-pay | Admitting: Internal Medicine

## 2011-02-11 ENCOUNTER — Ambulatory Visit: Payer: Self-pay

## 2011-02-11 ENCOUNTER — Other Ambulatory Visit: Payer: Self-pay | Admitting: *Deleted

## 2011-02-11 ENCOUNTER — Ambulatory Visit: Payer: Self-pay | Admitting: Physical Therapy

## 2011-02-11 ENCOUNTER — Ambulatory Visit: Payer: Self-pay | Admitting: Occupational Therapy

## 2011-02-11 DIAGNOSIS — K219 Gastro-esophageal reflux disease without esophagitis: Secondary | ICD-10-CM

## 2011-02-11 DIAGNOSIS — I10 Essential (primary) hypertension: Secondary | ICD-10-CM

## 2011-02-11 MED ORDER — ESOMEPRAZOLE MAGNESIUM 40 MG PO CPDR: 40.0000 mg | DELAYED_RELEASE_CAPSULE | Freq: Every day | ORAL | Status: DC

## 2011-02-11 MED ORDER — QUINAPRIL HCL 20 MG PO TABS: 20.0000 mg | ORAL_TABLET | Freq: Two times a day (BID) | ORAL | Status: DC

## 2011-02-11 MED ORDER — AMLODIPINE BESYLATE 10 MG PO TABS: 10.0000 mg | ORAL_TABLET | Freq: Every day | ORAL | Status: DC

## 2011-02-12 ENCOUNTER — Ambulatory Visit (INDEPENDENT_AMBULATORY_CARE_PROVIDER_SITE_OTHER): Payer: Self-pay | Admitting: Internal Medicine

## 2011-02-12 ENCOUNTER — Encounter: Payer: Self-pay | Admitting: Internal Medicine

## 2011-02-12 DIAGNOSIS — J069 Acute upper respiratory infection, unspecified: Secondary | ICD-10-CM

## 2011-02-12 DIAGNOSIS — I10 Essential (primary) hypertension: Secondary | ICD-10-CM

## 2011-02-12 DIAGNOSIS — K219 Gastro-esophageal reflux disease without esophagitis: Secondary | ICD-10-CM

## 2011-02-12 DIAGNOSIS — E785 Hyperlipidemia, unspecified: Secondary | ICD-10-CM

## 2011-02-12 DIAGNOSIS — I619 Nontraumatic intracerebral hemorrhage, unspecified: Secondary | ICD-10-CM

## 2011-02-12 LAB — BASIC METABOLIC PANEL
BUN: 23 mg/dL (ref 6–23)
Chloride: 103 mEq/L (ref 96–112)
Glucose, Bld: 115 mg/dL — ABNORMAL HIGH (ref 70–99)
Potassium: 3.6 mEq/L (ref 3.5–5.3)

## 2011-02-12 LAB — LIPID PANEL
Cholesterol: 165 mg/dL (ref 0–200)
Total CHOL/HDL Ratio: 5.7 Ratio

## 2011-02-12 MED ORDER — ATENOLOL 100 MG PO TABS: 100.0000 mg | ORAL_TABLET | Freq: Every day | ORAL | Status: DC

## 2011-02-12 MED ORDER — HYDROCHLOROTHIAZIDE 25 MG PO TABS: 25.0000 mg | ORAL_TABLET | Freq: Every day | ORAL | Status: DC

## 2011-02-12 MED ORDER — ESOMEPRAZOLE MAGNESIUM 40 MG PO CPDR: 40.0000 mg | DELAYED_RELEASE_CAPSULE | Freq: Every day | ORAL | Status: DC

## 2011-02-12 MED ORDER — AMLODIPINE BESYLATE 10 MG PO TABS: 10.0000 mg | ORAL_TABLET | Freq: Every day | ORAL | Status: DC

## 2011-02-12 MED ORDER — QUINAPRIL HCL 20 MG PO TABS: 20.0000 mg | ORAL_TABLET | Freq: Two times a day (BID) | ORAL | Status: DC

## 2011-02-12 NOTE — Patient Instructions (Addendum)
1.  For the cold and congestion:  - Coricidin HBP is the medication for the congestion.  It should not raise your blood pressure.  - Avoid anything with Pseudoephedrine or Phenylephedrine  - Salt water gargle or Chloroseptic spray for the throat discomfort.  - This should work itself in about 1 week or so.  If you get a fever with a temperature higher then 101 or increased shortness of breath please call us.  2.  Stop in the lab to have your blood drawn.  If we need to follow up on anything I will call you.  3.  Continue taking your medications as prescribed.  Keep a log of your blood pressure and bring it to your follow up appointment.  4.  Follow up in about 1 month.     Sodium-Controlled Diet Sodium is a mineral. It is found in many foods. Sodium may be found naturally or added during the making of a food. The most common form of sodium is salt, which is made up of sodium and chloride. Reducing your sodium intake involves changing your eating habits. The following guidelines will help you reduce the sodium in your diet:  Stop using the salt shaker.   Use salt sparingly in cooking and baking.   Substitute with sodium-free seasonings and spices.   Do not use a salt substitute (potassium chloride) without your caregiver's permission.   Include a variety of fresh, unprocessed foods in your diet.   Limit the use of processed and convenience foods that are high in sodium.  USE THE FOLLOWING FOODS SPARINGLY: Breads/Starches  Commercial bread stuffing, commercial pancake or waffle mixes, coating mixes. Waffles. Croutons. Prepared (boxed or frozen) potato, rice, or noodle mixes that contain salt or sodium. Salted [place] fries or hash browns. Salted popcorn, breads, crackers, chips, or snack foods.  Vegetables  Vegetables canned with salt or prepared in cream, butter, or cheese sauces. Sauerkraut. Tomato or vegetable juices canned with salt.   Fresh vegetables are allowed if rinsed  thoroughly.  Fruit  Fruit is okay to eat.  Meat and Meat Substitutes  Salted or smoked meats, such as bacon or Canadian bacon, chipped or corned beef, hot dogs, salt pork, luncheon meats, pastrami, ham, or sausage. Canned or smoked fish, poultry, or meat. Processed cheese or cheese spreads, blue or Roquefort cheese. Battered or frozen fish products. Prepared spaghetti sauce. Baked beans. Reuben sandwiches. Salted nuts. Caviar.  Milk  Limit buttermilk to 1 cup per week.  Soups and Combination Foods  Bouillon cubes, canned or dried soups, broth, consomm. Convenience (frozen or packaged) dinners with more than 600 mg sodium. Pot pies, pizza, Asian food, fast food cheeseburgers, and specialty sandwiches.  Desserts and Sweets  Regular (salted) desserts, pie, commercial fruit snack pies, commercial snack cakes, canned puddings.   Eat desserts and sweets in moderation.  Fats and Oils  Gravy mixes or canned gravy. No more than 1 to 2 tbs of salad dressing. Chip dips.   Eat fats and oils in moderation.  Beverages  See those listed under the vegetables and milk groups.  Condiments  Ketchup, mustard, meat sauces, salsa, regular (salted) and lite soy sauce or mustard. Dill pickles, olives, meat tenderizer. Prepared horseradish or pickle relish. Dutch-processed cocoa. Baking powder or baking soda used medicinally. Worcestershire sauce. "Light" salt. Salt substitute, unless approved by your caregiver.  Document Released: 08/02/2001 Document Revised: 10/23/2010 Document Reviewed: 03/05/2009 [facility] Patient Information 2012 [place].

## 2011-02-12 NOTE — Progress Notes (Signed)
Subjective:   Patient ID: Edward Morgan male   DOB: 01/25/1951 60 y.o.   MRN: [id number]  HPI: Edward Morgan is a 60 y.o. man who presents to clinic today for follow up from his hospitalization.  He was hospitalized from 12/3-12/5 after suffering a hemorrhagic stroke in the left basal ganglia.  He states that since his hospitalization his right side is still mildly weaker then the left side and his speech occasionally is a bit slurred but much improved from previously.    He has a history of hypertension, dyslipidemia, CKD stage II/III, and hepatitis C that was partially treated previously at a [place] in [place].  He also states that he has a family history of his Dad having an MI in his 19s and a stroke late 65s  His dad and brother have DM as well.    He states that he is active on a regular basis but does mostly weight lifting and not much cardio.  During his hospitalization he had a MRI, MRA which showed the hematoma but no aneurysm.    Of note his statin medication was stopped recently because of concern with his chronic Hepatitis C.  He is in the process of looking into treatment with the novel agents that were just approved by the FDA.  He has been having problems getting an appointment with the Hepatologist that comes to [facility] from [facility].    He also notes that he got his flu shot on Nov. 4th.  He states that yesterday he started to have a sore throat, post nasal drip, sneezing, coughing, and some chest tightness.  He denies fevers, myalgias, or productive cough.  He had several grandchildren who were sick around him lately.    Past Medical History  Diagnosis Date  . Depression   . Hypertension   . 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  [name] GERD (gastroesophageal reflux disease)   . Arthritis   . H. pylori infection     history of  . Shortness of breath   . Stroke  01/27/2011   Current Outpatient Prescriptions  Medication Sig Dispense Refill  . amLODipine (NORVASC) 10 MG tablet Take 1 tablet (10 mg total) by mouth daily.  90 tablet  3  . atenolol (TENORMIN) 100 MG tablet Take 1 tablet (100 mg total) by mouth daily.  30 tablet  6  . esomeprazole (NEXIUM) 40 MG capsule Take 1 capsule (40 mg total) by mouth daily before breakfast.  90 capsule  2  . hydrochlorothiazide 25 MG tablet Take 1 tablet (25 mg total) by mouth daily.  30 tablet  6  . lisinopril (PRINIVIL,ZESTRIL) 20 MG tablet Take 20 mg by mouth 3 (three) times daily.        . Multiple Vitamins-Minerals (MENS MULTI VITAMIN & MINERAL) TABS Take 1 tablet by mouth daily.        . quinapril (ACCUPRIL) 20 MG tablet Take 1 tablet (20 mg total) by mouth 2 (two) times daily.  180 tablet  2   Family History  Problem Relation Age of Onset  . Hypertension Mother   . Hypertension Father    History   Social History  . Marital Status: Married    Spouse Name: N/A    Number of Children: N/A  . Years of Education: N/A   Social History Main  Topics  . Smoking status: Never Smoker   . Smokeless tobacco: Never Used  . Alcohol Use: No  . Drug Use: No     used to be a IDU.  [name] Sexually Active: Yes   Other Topics Concern  . None   Social History Narrative   Financial assistance approved for 100% discount at [facility] and has [place]  March 12, 2010 5:55 PMDivorced and single currently.Was in prison in 29's.   Review of Systems: Negative except as noted in the HPI.   Objective:  Physical Exam: Filed Vitals:   02/12/11 0824  BP: 145/83  Pulse: 82  Temp: 98 F (36.7 C)  TempSrc: Oral  Height: 6' (1.829 m)  Weight: 232 lb 12.8 oz (105.597 kg)   Constitutional: Vital signs reviewed.  Patient is a well-developed and well-nourished man in no acute distress and cooperative with exam. Alert and oriented x3.  Head: Normocephalic and atraumatic Ear: TM normal bilaterally Nose: turbinate  erythema and congestion noted.  Mouth: posterior pharynx with mild erythema. no exudates, MMM Eyes: PERRL, EOMI, conjunctivae normal, No scleral icterus.  Neck: Supple, Trachea midline normal ROM, No JVD, mass, thyromegaly, or carotid bruit present.  Cardiovascular: RRR, S1 normal, S2 normal, no MRG, pulses symmetric and intact bilaterally Pulmonary/Chest: CTAB, no wheezes, rales, or rhonchi Abdominal: Soft. Non-tender, non-distended, bowel sounds are normal, no masses, organomegaly, or guarding present.  GU: no CVA tenderness Musculoskeletal: No joint deformities, erythema, or stiffness, ROM full and no nontender Hematology: no cervical, inginal, or axillary adenopathy.  Neurological: A&O x3, Strength is 5/5 on the left upper and lower extremity.  Strength in the right upper extremity 4/5 distal and 4+/5 in proximal.  Right lower extremity is 4+/5 strength.  cranial nerve II-XII are grossly intact, sensory intact to light touch bilaterally.  Skin: Warm, dry and intact. No rash, cyanosis, or clubbing.  Psychiatric: Normal mood and affect. speech and behavior is normal. Judgment and thought content normal. Cognition and memory are normal.   Assessment & Plan:

## 2011-02-13 DIAGNOSIS — E785 Hyperlipidemia, unspecified: Secondary | ICD-10-CM | POA: Insufficient documentation

## 2011-02-13 LAB — HEMOGLOBIN A1C: Mean Plasma Glucose: 131 mg/dL — ABNORMAL HIGH (ref <117)

## 2011-02-14 ENCOUNTER — Ambulatory Visit: Payer: Self-pay | Admitting: Occupational Therapy

## 2011-02-14 ENCOUNTER — Ambulatory Visit: Payer: Self-pay

## 2011-02-14 ENCOUNTER — Ambulatory Visit: Payer: Self-pay | Admitting: Physical Therapy

## 2011-02-14 MED ORDER — PRAVASTATIN SODIUM 20 MG PO TABS: 20.0000 mg | ORAL_TABLET | Freq: Every day | ORAL | Status: DC

## 2011-02-24 ENCOUNTER — Ambulatory Visit: Payer: Self-pay | Admitting: Speech Pathology

## 2011-02-24 ENCOUNTER — Ambulatory Visit: Payer: Self-pay | Admitting: Physical Therapy

## 2011-02-24 ENCOUNTER — Ambulatory Visit: Payer: Self-pay | Admitting: *Deleted

## 2011-02-26 ENCOUNTER — Ambulatory Visit: Payer: Self-pay | Admitting: *Deleted

## 2011-02-26 ENCOUNTER — Ambulatory Visit: Payer: Self-pay | Admitting: Speech Pathology

## 2011-02-26 ENCOUNTER — Ambulatory Visit: Payer: Self-pay | Attending: Nurse Practitioner | Admitting: Physical Therapy

## 2011-02-26 DIAGNOSIS — I69998 Other sequelae following unspecified cerebrovascular disease: Secondary | ICD-10-CM | POA: Insufficient documentation

## 2011-02-26 DIAGNOSIS — M6281 Muscle weakness (generalized): Secondary | ICD-10-CM | POA: Insufficient documentation

## 2011-02-26 DIAGNOSIS — R279 Unspecified lack of coordination: Secondary | ICD-10-CM | POA: Insufficient documentation

## 2011-02-26 DIAGNOSIS — I69920 Aphasia following unspecified cerebrovascular disease: Secondary | ICD-10-CM | POA: Insufficient documentation

## 2011-02-26 DIAGNOSIS — Z5189 Encounter for other specified aftercare: Secondary | ICD-10-CM | POA: Insufficient documentation

## 2011-03-03 ENCOUNTER — Ambulatory Visit: Payer: Self-pay | Admitting: Physical Therapy

## 2011-03-03 ENCOUNTER — Encounter: Payer: Self-pay | Admitting: *Deleted

## 2011-03-03 ENCOUNTER — Encounter: Payer: Self-pay | Admitting: Speech Pathology

## 2011-03-05 ENCOUNTER — Ambulatory Visit: Payer: Self-pay | Admitting: Physical Therapy

## 2011-03-05 ENCOUNTER — Encounter: Payer: Self-pay | Admitting: Speech Pathology

## 2011-03-05 ENCOUNTER — Encounter: Payer: Self-pay | Admitting: *Deleted

## 2011-03-09 ENCOUNTER — Encounter: Payer: Self-pay | Admitting: Internal Medicine

## 2011-03-09 DIAGNOSIS — J069 Acute upper respiratory infection, unspecified: Secondary | ICD-10-CM | POA: Insufficient documentation

## 2011-03-09 NOTE — Assessment & Plan Note (Signed)
Lab Results  Component Value Date   NA 140 02/12/2011   K 3.6 02/12/2011   CL 103 02/12/2011   CO2 26 02/12/2011   BUN 23 02/12/2011   CREATININE 1.64* 02/12/2011   CREATININE 1.43* 01/29/2011    BP Readings from Last 3 Encounters:  02/12/11 132/80  01/29/11 139/73  08/20/10 134/86    Assessment: Hypertension control:  mildly elevated  Progress toward goals:  unchanged Barriers to meeting goals:  no barriers identified  Plan: Hypertension treatment:  continue current medications his blood pressure in clinic has been excellent though it may be worthwhile to consider a lower goal of <130/80 consistently because of the location of his hemorrhage.  It may have also been related to straining while lifting weights as well.  We will have him keep a blood pressure log and bring it to his follow up to see if he has spikes that we need to control.  At this time we will hold any changes to his regimen.

## 2011-03-09 NOTE — Assessment & Plan Note (Signed)
He has symptoms of a viral URI.  We will do symptomatic treatment for him.  I advised him to avoid anything with pseudoephedrine or phenylephedrine which could increase his blood pressure.  Clorocidin HBP should help him without raising his BP.

## 2011-03-09 NOTE — Assessment & Plan Note (Addendum)
Lab Results  Component Value Date   CHOL 165 02/12/2011   CHOL 163 12/18/2009   CHOL  Value: 109        ATP III CLASSIFICATION:  <200     mg/dL   Desirable  200-239  mg/dL   Borderline High  >=240    mg/dL   High        01/26/2009   Lab Results  Component Value Date   HDL 29* 02/12/2011   HDL 34* 12/18/2009   HDL <10 REPEATED TO VERIFY* 01/26/2009   Lab Results  Component Value Date   LDLCALC 107* 02/12/2011   LDLCALC 105* 12/18/2009   LDLCALC NOT CALCULATED 01/26/2009   Lab Results  Component Value Date   TRIG 147 02/12/2011   TRIG 120 12/18/2009   TRIG 180* 01/26/2009   Lab Results  Component Value Date   CHOLHDL 5.7 02/12/2011   CHOLHDL 4.8 Ratio 12/18/2009   CHOLHDL NOT CALCULATED 01/26/2009   No results found for this basename: LDLDIRECT   He has dyslipidemia with decreased HDL and mildly increased LDL.  There is some concern for the use of statins but on reviewing uptodate the only contraindication for holding his statins is if his liver enzymes are elevated along with the persistent viral load that he does have.   Comprehensive Metabolic Panel:    Component Value Date/Time   NA 140 02/12/2011 0906   K 3.6 02/12/2011 0906   CL 103 02/12/2011 0906   CO2 26 02/12/2011 0906   BUN 23 02/12/2011 0906   CREATININE 1.64* 02/12/2011 0906   CREATININE 1.43* 01/29/2011 0342   GLUCOSE 115* 02/12/2011 0906   CALCIUM 9.3 02/12/2011 0906   AST 39* 01/29/2011 0342   ALT 47 01/29/2011 0342   ALKPHOS 81 01/29/2011 0342   BILITOT 0.6 01/29/2011 0342   PROT 7.0 01/29/2011 0342   ALBUMIN 3.3* 01/29/2011 0342   His AST is mildly elevated but not enough that I would think he is unable to take a statin for his cholesterol.  We will start Pravastatin 20 mg daily and see him back in about 2 months to recheck his liver enzymes.  If they are elevated we will have to stop the statin.

## 2011-03-09 NOTE — Assessment & Plan Note (Addendum)
MRI showed a hemorrhagic stroke in the left basal ganglia which correlates with his right sided weakness.  He has some mild residual weakness in both upper and lower extremities.  The area of the hemorrhage correlates with increased blood pressure so he will need aggressive control to limit problems.  He did not have a HgBA1C or FLP done while in the hospital so I will get those today.  Lab Results  Component Value Date   HGBA1C 6.2* 02/12/2011   Lab Results  Component Value Date   CHOL 165 02/12/2011   HDL 29* 02/12/2011   LDLCALC 107* 02/12/2011   TRIG 147 02/12/2011   CHOLHDL 5.7 02/12/2011   His A1C is indicative of likely prediabetes.  We will work on diet and changing his workout routine to more cardio and less focus on weights and recheck it in 3 months.  If at that time he is still high even though he doesn't technically qualify as diabetes by A1C alone it may be worthwhile doing a glucose tolerance test or just starting him on metformin and following him.

## 2011-03-10 ENCOUNTER — Ambulatory Visit: Payer: Self-pay | Admitting: Physical Therapy

## 2011-03-10 ENCOUNTER — Encounter: Payer: Self-pay | Admitting: *Deleted

## 2011-03-12 ENCOUNTER — Encounter: Payer: Self-pay | Admitting: *Deleted

## 2011-03-12 ENCOUNTER — Ambulatory Visit: Payer: Self-pay | Admitting: Physical Therapy

## 2011-03-14 ENCOUNTER — Ambulatory Visit (INDEPENDENT_AMBULATORY_CARE_PROVIDER_SITE_OTHER): Payer: Self-pay | Admitting: Internal Medicine

## 2011-03-14 ENCOUNTER — Encounter: Payer: Self-pay | Admitting: Internal Medicine

## 2011-03-14 VITALS — BP 149/81 | HR 65 | Temp 97.8°F | Ht 72.0 in | Wt 232.6 lb

## 2011-03-14 DIAGNOSIS — B1920 Unspecified viral hepatitis C without hepatic coma: Secondary | ICD-10-CM

## 2011-03-14 DIAGNOSIS — I619 Nontraumatic intracerebral hemorrhage, unspecified: Secondary | ICD-10-CM

## 2011-03-14 DIAGNOSIS — E785 Hyperlipidemia, unspecified: Secondary | ICD-10-CM

## 2011-03-14 DIAGNOSIS — K219 Gastro-esophageal reflux disease without esophagitis: Secondary | ICD-10-CM

## 2011-03-14 DIAGNOSIS — I10 Essential (primary) hypertension: Secondary | ICD-10-CM

## 2011-03-14 DIAGNOSIS — B1710 Acute hepatitis C without hepatic coma: Secondary | ICD-10-CM

## 2011-03-14 DIAGNOSIS — R0602 Shortness of breath: Secondary | ICD-10-CM | POA: Insufficient documentation

## 2011-03-14 DIAGNOSIS — Z0000 Encounter for general adult medical examination without abnormal findings: Secondary | ICD-10-CM | POA: Insufficient documentation

## 2011-03-14 MED ORDER — QUINAPRIL HCL 20 MG PO TABS: ORAL_TABLET | ORAL | Status: DC

## 2011-03-14 MED ORDER — ESOMEPRAZOLE MAGNESIUM 40 MG PO CPDR: 40.0000 mg | DELAYED_RELEASE_CAPSULE | Freq: Two times a day (BID) | ORAL | Status: DC

## 2011-03-14 MED ORDER — AMLODIPINE BESYLATE 10 MG PO TABS: 10.0000 mg | ORAL_TABLET | Freq: Every day | ORAL | Status: DC

## 2011-03-14 NOTE — Assessment & Plan Note (Signed)
-   Working on blood pressure - On a statin - Got flu shot this year - A colonoscopy in 2009, diverticulosis only, next followup likely 2019 - Monitoring A1c, no history of diabetes but is currently prediabetes

## 2011-03-14 NOTE — Assessment & Plan Note (Signed)
BP Readings from Last 3 Encounters:  03/14/11 149/81  02/12/11 132/80  01/29/11 139/73   blood pressure today is above goal, and patient measures at home with pressures routinely in the 150s or higher. He says they are rarely in the 130s or lower. Patient is currently on 4 antihypertensive meds, with seemingly good compliance.  My plan today is to increase his quinapril to 40 every morning and 20 in the evening. If this does not make a difference, or if he continues to be high at his next visit in 4 weeks, we should consider adding another agent, possibly clonidine. - BMP today - Recheck BMP next visit - Increase Accupril today - Consider clonidine and next visit

## 2011-03-14 NOTE — Progress Notes (Signed)
I discussed patient with resident Dr. Dorthula Nettles, and I agree with the plans as outlined in his note.

## 2011-03-14 NOTE — Assessment & Plan Note (Signed)
As outlined at last visit, started pravastatin. Tolerating medicine well. We'll need LFT check at next visit to make sure there are no elevations. - Continue statin - Recheck LFTs at next visit

## 2011-03-14 NOTE — Assessment & Plan Note (Signed)
Because of stroke back in December, patient will need close monitoring of risk factors. Currently working on blood pressure, recently started statin, and A1c in December was 6.3. He will need a recheck in February. Please see prior note from 02/12/11 for full details. - Recheck A1c in February - Continue blood pressure control - Continue lipid modification

## 2011-03-14 NOTE — Assessment & Plan Note (Signed)
Had been seen roughly 2 years ago by the hepatologist from [facility] who comes to [place]. At that time because of his mutation, they had no treatment to offer him. They told him to followup in roughly one year, but has been unable to get into see them since. I offered to try to contact him, but he declined. He said they told him that hepatitis C would not be his primary health problem in the near future. Given his stroke and high blood pressure, I tend to agree this may not be his primary problem. Nonetheless, we should continue to urge him to contact them at regular intervals to see if there are new treatments available.

## 2011-03-14 NOTE — Assessment & Plan Note (Signed)
Patient complaining of some dyspnea on exertion since leaving the hospital in early December. On exam he has some 1+ edema but no crackles. During his hospitalization for stroke in December, it does not appear that an echo was done. Given his long-standing hypertension, I think this is warranted. This may just be continued deconditioning from his hospitalization, but I think an echo to evaluate LV function is reasonable at this time. - Echocardiogram - Blood pressure control - Continue rehabilitation and exercise

## 2011-03-14 NOTE — Progress Notes (Signed)
Subjective:     Patient ID: Edward Morgan, male   DOB: February 02, 1951, 61 y.o.   MRN: [id number]  HPI Patient is a 61 year old man with a history of HCV, resistant hypertension, CVA in 01/2011, and CTD who presents for followup.  Overall the patient is doing well and taking his medicines as prescribed. He does complain of some mild shortness of breath since leaving the hospital in December of 2012, but denies PND, chest pain, or palpitations. He does not smoke or drink currently.  He takes his blood pressures at home, and it is commonly in the [id number] systolic, occasionally the 170s. It is rarely in the 140s or lower. He does take his medicines as prescribed, and he is familiar with her medicines. He walks for exercise, but not as much as he would like to. He is planning on starting going to the gym next week little bit.  Review of Systems Slight dryness of the calves, as per history of present illness    Objective:   Physical Exam GEN: NAD.  Alert and oriented x 3.  Pleasant, conversant, and cooperative to exam. RESP:  CTAB, no w/r/r CARDIOVASCULAR: RRR, S1, S2, no m/r/g ABDOMEN: soft, NT/ND, NABS EXT: warm and dry. 1+ pitting edema to midcalves b/l     Assessment:         Plan:

## 2011-03-15 LAB — BASIC METABOLIC PANEL WITH GFR
BUN: 25 mg/dL — ABNORMAL HIGH (ref 6–23)
Calcium: 9.7 mg/dL (ref 8.4–10.5)
Creat: 1.8 mg/dL — ABNORMAL HIGH (ref 0.50–1.35)
GFR, Est African American: 46 mL/min — ABNORMAL LOW
Glucose, Bld: 104 mg/dL — ABNORMAL HIGH (ref 70–99)
Sodium: 139 mEq/L (ref 135–145)

## 2011-03-25 ENCOUNTER — Ambulatory Visit (HOSPITAL_COMMUNITY)
Admission: RE | Admit: 2011-03-25 | Discharge: 2011-03-25 | Disposition: A | Discharge status: Home / Self Care | Payer: Self-pay | Source: Ambulatory Visit | Attending: Internal Medicine | Admitting: Internal Medicine

## 2011-03-25 DIAGNOSIS — I10 Essential (primary) hypertension: Secondary | ICD-10-CM

## 2011-04-10 ENCOUNTER — Ambulatory Visit (INDEPENDENT_AMBULATORY_CARE_PROVIDER_SITE_OTHER): Payer: Self-pay | Admitting: Internal Medicine

## 2011-04-10 ENCOUNTER — Encounter: Payer: Self-pay | Admitting: Internal Medicine

## 2011-04-10 VITALS — BP 152/83 | HR 72 | Temp 97.1°F | Ht 72.0 in | Wt 230.3 lb

## 2011-04-10 DIAGNOSIS — M25579 Pain in unspecified ankle and joints of unspecified foot: Secondary | ICD-10-CM

## 2011-04-10 DIAGNOSIS — I629 Nontraumatic intracranial hemorrhage, unspecified: Secondary | ICD-10-CM

## 2011-04-10 DIAGNOSIS — E785 Hyperlipidemia, unspecified: Secondary | ICD-10-CM

## 2011-04-10 DIAGNOSIS — I10 Essential (primary) hypertension: Secondary | ICD-10-CM

## 2011-04-10 DIAGNOSIS — I619 Nontraumatic intracerebral hemorrhage, unspecified: Secondary | ICD-10-CM

## 2011-04-10 LAB — LIPID PANEL
LDL Cholesterol: 81 mg/dL (ref 0–99)
VLDL: 19 mg/dL (ref 0–40)

## 2011-04-10 MED ORDER — CLONIDINE HCL 0.1 MG PO TABS: 0.1000 mg | ORAL_TABLET | Freq: Two times a day (BID) | ORAL | Status: DC

## 2011-04-10 MED ORDER — ACETAMINOPHEN 325 MG PO TABS: 650.0000 mg | ORAL_TABLET | Freq: Four times a day (QID) | ORAL | Status: DC | PRN

## 2011-04-10 NOTE — Assessment & Plan Note (Signed)
His LDL was 107 on 03/15/11. He started statin treatment for more than 6 weeks.  He did not notice any side effects, such as muscle pain.Will check his lipid panel today. Will make adjustment if his LDL is not at goal. Will check his liver function today.

## 2011-04-10 NOTE — Assessment & Plan Note (Signed)
Blood pressure today is still above goal.  Patient is currently on 4 antihypertensive meds with recent adjustment of the does of quinapril. He seems very compliant to his treatment. Will add clonidine 0.1 mg bid. His Cre was 1.80 on 03/14/11 which is lightly elevated than his baseline (Baseline cre 1.4 to 1.64 last year). At this moment, will not adjust his ACEI. Will check his BMP today. If cre is stable or tending down, will continue current regimen. If cre is worse, will consider to stop his Quinapril.

## 2011-04-10 NOTE — Patient Instructions (Signed)
1. I added one more medication for your hypertension, Clonidine 0.1 mg twice daily by mouth. 2. Please take all medications as prescribed.  3. If you have worsening of your symptoms or new symptoms arise, please call the clinic [phone]), or go to the ER immediately if symptoms are severe.

## 2011-04-10 NOTE — Progress Notes (Signed)
I discussed patient with resident Dr. Blaine Hamper, and I agree with the plans as outlined in his note.

## 2011-04-10 NOTE — Progress Notes (Signed)
Subjective:   Patient ID: Edward Morgan male   DOB: November 11, 1950 61 y.o.   MRN: [id number]  HPI: Edward Morgan is a 61 y.o.   Edward Morgan is a 61 y.o. man with PMH hypertension, dyslipidemia, CKD stage II/III, hepatitis C  and recent hemorrhagic stroke, who presents to clinic today for a follow up visit.   He was hospitalized from 12/3-12/5 after suffering a hemorrhagic stroke in the left basal ganglia.  he completed physical therapy recently. He reports that his right side is mildly weaker than the left side and his speech occasionally is a bit slurred but much improved from previously.  His HTN was not well controlled in previous visit and his quinapril dose was increased to total of 60 mg a day. He reports taking all his medications regularly.  He does not have new complaints except for mild soreness in his right ankle.  Denies fever, chills, fatigue, headaches,  cough, chest pain, SOB,  abdominal pain,diarrhea, constipation, dysuria, urgency, frequency, hematuria, or leg swelling.   Past Medical History  Diagnosis Date  . Depression   . Hypertension   . 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  [name] GERD (gastroesophageal reflux disease)   . Arthritis   . H. pylori infection     history of  . Shortness of breath   . Stroke 01/27/2011   Current Outpatient Prescriptions  Medication Sig Dispense Refill  . amLODipine (NORVASC) 10 MG tablet Take 1 tablet (10 mg total) by mouth daily.  90 tablet  2  . atenolol (TENORMIN) 100 MG tablet Take 1 tablet (100 mg total) by mouth daily.  30 tablet  6  . esomeprazole (NEXIUM) 40 MG capsule Take 1 capsule (40 mg total) by mouth 2 (two) times daily.  90 capsule  2  . hydrochlorothiazide (HYDRODIURIL) 25 MG tablet Take 1 tablet (25 mg total) by mouth daily.  30 tablet  6  . Multiple Vitamins-Minerals (MENS MULTI VITAMIN &  MINERAL) TABS Take 1 tablet by mouth daily.        . pravastatin (PRAVACHOL) 20 MG tablet Take 1 tablet (20 mg total) by mouth daily.  30 tablet  6  . quinapril (ACCUPRIL) 20 MG tablet 40mg  in a.m., 20mg  in pm.  Dispense 20mg  pills.  180 tablet  3  . acetaminophen (TYLENOL) 325 MG tablet Take 2 tablets (650 mg total) by mouth every 6 (six) hours as needed for pain.  100 tablet  1  . cloNIDine (CATAPRES) 0.1 MG tablet Take 1 tablet (0.1 mg total) by mouth 2 (two) times daily.  60 tablet  11   Family History  Problem Relation Age of Onset  . Hypertension Mother   . Hypertension Father   . Heart attack Father 58  . Stroke Father     late 29s  . Diabetes Brother    History   Social History  . Marital Status: Married    Spouse Name: N/A    Number of Children: N/A  . Years of Education: N/A   Social History Main Topics  . Smoking status: Never Smoker   . Smokeless tobacco: Never Used  . Alcohol Use: No  . Drug Use: No     used to be a [employer].  [name] Sexually Active: Yes   Other Topics Concern  . None   Social  History Narrative   Financial assistance approved for 100% discount at [facility] and has [place]  March 12, 2010 5:55 PMDivorced and single currently.Was in prison in 39's.   Review of Systems:  General: no fevers, chills, no changes in body weight, no changes in appetite Skin: no rash HEENT: no blurry vision, hearing changes or sore throat. Has mild slurry speech Pulm: no dyspnea, coughing, wheezing CV: no chest pain, palpitations, shortness of breath Abd: no nausea/vomiting, abdominal pain, diarrhea/constipation GU: no dysuria, hematuria, polyuria Ext: mild soreness in right ankle. Neuro: mild weakness in the right side.  No numbness or tingling   Objective:  Physical Exam: Filed Vitals:   04/10/11 0826  BP: 152/83  Pulse: 72  Temp: 97.1 F (36.2 C)  TempSrc: Oral  Height: 6' (1.829 m)  Weight: 230 lb 4.8 oz (104.463 kg)   General: not in acute  distress HEENT: PERRL, EOMI, no scleral icterus Cardiac: S1/S2, RRR, No murmurs, gallops or rubs Pulm: Good air movement bilaterally, Clear to auscultation bilaterally, No rales, wheezing, rhonchi or rubs. Abd: Soft,  nondistended, nontender, no rebound pain, no organomegaly, BS present Ext: No rashes or edema, 2+DP/PT pulse bilaterally. Right ankle grossly normal, mild tenderness without swelling or redness or warmth. Neurological: A&O x3, Strength is 5/5 on the left upper and lower extremity.  Strength in the right upper extremity 4/5 distal and 4+/5 in proximal.  Right lower extremity is 4+/5 strength.  cranial nerve II-XII are grossly intact, sensory intact to light touch bilaterally.   Assessment & Plan:   # HTN:  Blood pressure today is still above goal.  Patient is currently on 4 antihypertensive meds with recent adjustment of the does of quinapril. He seems very compliant to his treatment. Will add clonidine 0.1 mg bid. His Cre was 1.80 on 03/14/11 which is lightly elevated than his baseline (Baseline cre 1.4 to 1.64 last year). At this moment, will not adjust his ACEI. Will check his BMP today. If cre is stable or tending down, will continue current regimen. If cre is worse, will consider to stop his Quinapril.   # Hemarrhagic stroke: completed physical therapy. Current strategy is closely monitoring his risk factors. His A1c was 6.3  on 03/15/11. Will re-check it today per his PCP. His LDL was 107 on 03/15/11. He started statin treatment more than 6 weeks. Will check his lipid panel today. His blood pressure is above the goal. Plan is made as above.  #. HLD:  His LDL was 107 on 03/15/11. He started statin treatment for more than 6 weeks.  He did not notice any side effects, such as muscle pain.Will check his lipid panel today. Will make adjustment if his LDL is not at goal. Will check his liver function today.  # HM:  - - Got flu shot this year  - A colonoscopy in 2009, diverticulosis only,  next followup likely 2019  - Monitoring A1c, no history of diabetes but is currently prediabetes  Ivor Costa

## 2011-04-11 LAB — BASIC METABOLIC PANEL WITH GFR
BUN: 22 mg/dL (ref 6–23)
CO2: 26 mEq/L (ref 19–32)
Calcium: 9.4 mg/dL (ref 8.4–10.5)
Creat: 1.63 mg/dL — ABNORMAL HIGH (ref 0.50–1.35)
Glucose, Bld: 110 mg/dL — ABNORMAL HIGH (ref 70–99)

## 2011-04-11 LAB — HEPATIC FUNCTION PANEL
Bilirubin, Direct: 0.2 mg/dL (ref 0.0–0.3)
Indirect Bilirubin: 0.5 mg/dL (ref 0.0–0.9)

## 2011-05-08 ENCOUNTER — Encounter: Payer: Self-pay | Admitting: Internal Medicine

## 2011-05-08 ENCOUNTER — Ambulatory Visit (INDEPENDENT_AMBULATORY_CARE_PROVIDER_SITE_OTHER): Payer: Self-pay | Admitting: Internal Medicine

## 2011-05-08 VITALS — BP 137/78 | HR 62 | Temp 97.5°F | Ht 72.0 in | Wt 229.1 lb

## 2011-05-08 DIAGNOSIS — I619 Nontraumatic intracerebral hemorrhage, unspecified: Secondary | ICD-10-CM

## 2011-05-08 DIAGNOSIS — E785 Hyperlipidemia, unspecified: Secondary | ICD-10-CM

## 2011-05-08 DIAGNOSIS — I10 Essential (primary) hypertension: Secondary | ICD-10-CM

## 2011-05-08 DIAGNOSIS — N289 Disorder of kidney and ureter, unspecified: Secondary | ICD-10-CM

## 2011-05-08 NOTE — Progress Notes (Signed)
Subjective:     Patient ID: Edward Morgan, male   DOB: [birth year], 61 y.o.   MRN: [id number]  HPI Mr. Ramirez is a 61 year old African American male with past medical history significant for hypertension, hyperlipidemia and hemorrhagic stroke of the left basal ganglia hospitalized December 2012. He has completed physical therapy and is following up today for further assessment of his blood pressure control. Of note the patient is on amlodipine 10 mg daily atenolol 100 mg daily, hydrochlorothiazide 25 mg daily, quinapril 20 mg daily, and clonidine 0.1 mg twice a day which was added at his last visit to 04/10/11. He reports feeling well and tolerating his medications without headaches, nausea, vomiting, shortness of breath, chest pain, myalgias, or weakness. He reports that he still has some difficulty with speech after her stroke but it is improving.   Review of Systems  Constitutional: Negative for fever and chills.  HENT: Negative for congestion and rhinorrhea.   Eyes: Negative for visual disturbance.  Respiratory: Positive for shortness of breath.        On exertion since stroke  Cardiovascular: Negative for chest pain and palpitations.  Genitourinary: Negative for dysuria and hematuria.  Musculoskeletal: Positive for back pain.       Chronic back pain stable  Neurological: Positive for speech difficulty. Negative for weakness and headaches.       Difficulty with articulation       Objective:   Physical Exam  Constitutional: He is oriented to person, place, and time. He appears well-developed and well-nourished.  Eyes: Conjunctivae and EOM are normal. Pupils are equal, round, and reactive to light.  Neck: Normal range of motion. Neck supple.  Cardiovascular: Normal rate, regular rhythm and normal heart sounds.   Pulmonary/Chest: Effort normal and breath sounds normal.  Abdominal: Soft. Bowel sounds are normal.  Musculoskeletal: Normal range of motion.  Neurological: He is alert and  oriented to person, place, and time.  Skin: Skin is warm and dry.       Assessment:     #1. hypertension: 137/78 with pulse 62 bpm at goal since adding clonidine 0.1 mg twice a day to his regimen of calcium channel blocker, beta blocker, thiazide diuretic, and ACE inhibitor.   #2. status post hemorrhagic stroke: Patient continues to improve, completed physical therapy,   #3 renal insufficiency: creatinine returned to its baseline of 1.63   #4 hyperlipidemia: Currently at goal with LDL of 81     Plan:     Currently: 5 antihypertensive medications with recent addition of clonidine, will continue current regimen of amlodipine 10 mg qd, atenolol 100 mg qd, HCTZ 25 mg qd, quinapril 20 mg qd, in addition to clonidine 0.1 mb bid   Will continue pravastatin at 20 mg daily   Patient to followup with PCP Dr. Tressie Stalker in 3-4 months

## 2011-05-08 NOTE — Patient Instructions (Signed)
It was a pleasure to meet you both today, Edward Morgan.I am glad to hear that your doing well and continuing to improve. Your  blood pressure has responded nicely to the new medicine of clonidine. Continue to take all the medicines as prescribed. I will like you to return in 3-4 months to ensure that all of the medications are meeting her goals.

## 2011-06-17 ENCOUNTER — Other Ambulatory Visit: Payer: Self-pay | Admitting: *Deleted

## 2011-06-17 DIAGNOSIS — K219 Gastro-esophageal reflux disease without esophagitis: Secondary | ICD-10-CM

## 2011-06-17 NOTE — Telephone Encounter (Signed)
Pharmacy asking for # 180 - 3 month supply

## 2011-06-18 MED ORDER — ESOMEPRAZOLE MAGNESIUM 40 MG PO CPDR: 40.0000 mg | DELAYED_RELEASE_CAPSULE | Freq: Two times a day (BID) | ORAL | Status: DC

## 2011-06-18 NOTE — Telephone Encounter (Signed)
Rx faxed in.

## 2011-07-16 ENCOUNTER — Ambulatory Visit (INDEPENDENT_AMBULATORY_CARE_PROVIDER_SITE_OTHER): Payer: Self-pay | Admitting: Sports Medicine

## 2011-07-16 VITALS — BP 132/78

## 2011-07-16 DIAGNOSIS — M7710 Lateral epicondylitis, unspecified elbow: Secondary | ICD-10-CM

## 2011-07-16 DIAGNOSIS — M7712 Lateral epicondylitis, left elbow: Secondary | ICD-10-CM

## 2011-07-16 NOTE — Assessment & Plan Note (Signed)
US Guided injection as above. Counterforce brace. HEP. RTC 3-4 weeks to reassess.

## 2011-07-16 NOTE — Progress Notes (Signed)
  Subjective:    Patient ID: Dessa Phi, male    DOB: 1950/07/25, 61 y.o.   MRN: [id number]  HPI Saliou returns to discuss left elbow pain. He's had this pain for over a year, and it had an injection in April of 2012 that worked very well. He is here desiring an additional injection. He localizes pain over the left lateral epicondyle, without radiation. Try some oral analgesics which are of minimal benefit. He did have a counterforce brace, but has since thrown it away.   Review of Systems    No fevers, chills, night sweats, weight loss, chest pain, or shortness of breath.  Social History: Non-smoker. Objective:   Physical Exam General:  Well developed, well nourished, and in no acute distress. Neuro:  Alert and oriented x3, extra-ocular muscles intact. Skin: Warm and dry, no rashes noted. Respiratory:  Not using accessory muscles, speaking in full sentences. Musculoskeletal: Left Elbow Unremarkable to inspection. Range of motion full to flexion and extension. Range of motion full to pronation and supination. Significant tenderness to palpation over the lateral epicondyle, reproduction of pain with resisted extension of the extensor carpi radialis brevis. Stable to varus and valgus stress, negative moving valgus stress test. No tenderness to palpation over the olecranon.  MSK ultrasound: Show small avulsion off the tip of the lateral condyle, there is also a significant amount of tendinosis in the deep common extensor tendon origin. He has a small amount of increased Doppler flow.  Real-time Ultrasound Guided Injection of: left lateral epicondyle, and extensor tendon origin. Ultrasound guided injection is preferred based studies that show increased duration, increased effect, greater accuracy, decreased procedural pain, increased response rate, and decreased cost with ultrasound guided versus blind injection. Verbal informed consent obtained. Time-out conducted. Noted no  overlying erythema, induration, or other signs of local infection. Skin prepped in a sterile fashion. Local anesthesia: Topical Ethyl chloride. With sterile technique and under real time ultrasound guidance: needle advanced deep to the origin of the common extensor tendon, at that point, 1 cc Depo-Medrol 40, 3 cc of lidocaine injected. Completed without difficulty Pain immediately resolved suggesting accurate placement of the medication. Advised to call if fevers/chills, erythema, induration, drainage, or persistent bleeding. Images saved.      Assessment & Plan:

## 2011-08-05 MED ORDER — MENS MULTI VITAMIN & MINERAL PO TABS: 1.0000 | ORAL_TABLET | Freq: Every day | ORAL | Status: DC

## 2011-08-05 NOTE — Progress Notes (Signed)
Addended by: Jenelle Mages on: 08/05/2011 12:02 PM   Modules accepted: Orders

## 2011-08-14 ENCOUNTER — Ambulatory Visit (INDEPENDENT_AMBULATORY_CARE_PROVIDER_SITE_OTHER): Payer: Self-pay | Admitting: Internal Medicine

## 2011-08-14 ENCOUNTER — Encounter: Payer: Self-pay | Admitting: Internal Medicine

## 2011-08-14 VITALS — BP 139/77 | HR 77 | Temp 98.3°F | Ht 72.0 in | Wt 220.0 lb

## 2011-08-14 DIAGNOSIS — N5089 Other specified disorders of the male genital organs: Secondary | ICD-10-CM | POA: Insufficient documentation

## 2011-08-14 DIAGNOSIS — I10 Essential (primary) hypertension: Secondary | ICD-10-CM

## 2011-08-14 DIAGNOSIS — I639 Cerebral infarction, unspecified: Secondary | ICD-10-CM

## 2011-08-14 DIAGNOSIS — R3911 Hesitancy of micturition: Secondary | ICD-10-CM | POA: Insufficient documentation

## 2011-08-14 DIAGNOSIS — K219 Gastro-esophageal reflux disease without esophagitis: Secondary | ICD-10-CM

## 2011-08-14 DIAGNOSIS — N508 Other specified disorders of male genital organs: Secondary | ICD-10-CM

## 2011-08-14 DIAGNOSIS — I619 Nontraumatic intracerebral hemorrhage, unspecified: Secondary | ICD-10-CM

## 2011-08-14 DIAGNOSIS — E785 Hyperlipidemia, unspecified: Secondary | ICD-10-CM

## 2011-08-14 DIAGNOSIS — Z0000 Encounter for general adult medical examination without abnormal findings: Secondary | ICD-10-CM

## 2011-08-14 DIAGNOSIS — M1990 Unspecified osteoarthritis, unspecified site: Secondary | ICD-10-CM

## 2011-08-14 MED ORDER — HYDROCHLOROTHIAZIDE 25 MG PO TABS: 25.0000 mg | ORAL_TABLET | Freq: Every day | ORAL | Status: DC

## 2011-08-14 MED ORDER — PRAVASTATIN SODIUM 20 MG PO TABS: 20.0000 mg | ORAL_TABLET | Freq: Every day | ORAL | Status: DC

## 2011-08-14 MED ORDER — CLONIDINE HCL 0.1 MG PO TABS: 0.1000 mg | ORAL_TABLET | Freq: Two times a day (BID) | ORAL | Status: DC

## 2011-08-14 MED ORDER — ASPIRIN EC 81 MG PO TBEC: 81.0000 mg | DELAYED_RELEASE_TABLET | Freq: Every day | ORAL | Status: DC

## 2011-08-14 MED ORDER — AMLODIPINE BESYLATE 10 MG PO TABS: 10.0000 mg | ORAL_TABLET | Freq: Every day | ORAL | Status: DC

## 2011-08-14 MED ORDER — TAMSULOSIN HCL 0.4 MG PO CAPS: 0.4000 mg | ORAL_CAPSULE | Freq: Every day | ORAL | Status: DC

## 2011-08-14 MED ORDER — ATENOLOL 100 MG PO TABS: 100.0000 mg | ORAL_TABLET | Freq: Every day | ORAL | Status: DC

## 2011-08-14 MED ORDER — ESOMEPRAZOLE MAGNESIUM 40 MG PO CPDR: 40.0000 mg | DELAYED_RELEASE_CAPSULE | Freq: Two times a day (BID) | ORAL | Status: DC

## 2011-08-14 MED ORDER — TRAMADOL HCL 50 MG PO TABS: 50.0000 mg | ORAL_TABLET | Freq: Four times a day (QID) | ORAL | Status: DC | PRN

## 2011-08-14 MED ORDER — QUINAPRIL HCL 20 MG PO TABS: ORAL_TABLET | ORAL | Status: DC

## 2011-08-14 NOTE — Assessment & Plan Note (Signed)
-   Blood pressure controlled - On a statin - Got flu shot this year - Colonoscopy in 2019 - As prediabetes, can check A1c every 6 months or so, can check at next visit

## 2011-08-14 NOTE — Assessment & Plan Note (Signed)
LDL at goal, continue statin. Recheck lipids in 6-12 months.

## 2011-08-14 NOTE — Assessment & Plan Note (Signed)
Pain in lower back and bilateral knees likely secondary to osteoarthritis. Hesitant to prescribe NSAIDs because of chronic kidney disease. - Tramadol - Consider imaging in the future if indicated

## 2011-08-14 NOTE — Progress Notes (Signed)
Subjective:     Patient ID: Edward Morgan, male   DOB: Jun 18, 1950, 61 y.o.   MRN: [id number]  HPI Patient is a 61 year old man with history of hemorrhagic stroke in under 2012, hypertension, hepatitis C, erectile dysfunction, chronic kidney disease, osteoarthritis who presents for followup.  Since suffering a hemorrhagic stroke in the winter of 2012, the patient continues to have some difficulties. He complains of bilateral knee pain, bilateral posterior calf pain, lower back pain, and tingling in both hands. He also complains of some difficulty speaking and some facial droop. He also has left-sided headache radiating into the eye. He has completed physical therapy.  Patient also describes difficulty with urinating, particularly with initiating his stream. Once beginning, his stream velocity and strength is okay. He does have an increase in nighttime urination. He denies any dysuria.  Patient also describes multiple scrotal masses that are slowly enlarging in size.   Review of Systems As per history of present illness     Objective:   Physical Exam Filed Vitals:   08/14/11 0827  BP: 139/77  Pulse: 77  Temp: 98.3 F (36.8 C)   Gen: NAD, cooperative HEENT: EOMI, PERRL, NCAT, symmetric face CV: RRR, no murmurs Chest: CTAB, symmetric GU: small external hemorrhoid, no prostate enlargement appreciated.  Scrotum appears enlarged with difficult to discern anatomy: multiple masses b/l.  R side has two distinct ovoid objects.  L side has larger and boggier mass in addition to testicle. Neuro Exam: Mental Status: A&O x 3, communicating freely CN: PERRL; no RAPD; EOMI; facial sensation intact; masseter full; hearing intact to finger rubs; pallete, uvula, and tongue midline; traps full Strength: Nl tone, nl bulk   delt Bicep tricep WE FE FA IP Ham Quad TA EHL Plantar   Right 5/5 5/5 5/5 5/5 5/5 5/5 5/5 5/5 5/5 5/5 5/5 5/5  Left 5/5 5/5 5/5 5/5 5/5 5/5 5/5 5/5 5/5 5/5 5/5 5/5  Sensation: SILT  in UE/LE b/l. Coordination: finger-to-nose intact Gait: decreased R arm swing     Assessment:         Plan:

## 2011-08-14 NOTE — Assessment & Plan Note (Signed)
Since his stroke in December, patient has completed physical therapy but continues to have multiple aches and pains. I'm concerned the emotional aspect of the stroke is continuing to affect this patient. His wife had suggested he go to stroke counseling groups, but he is resistant. I think this is an excellent idea. I also think he would benefit from continued neuro rehabilitation given his complaints of difficulty with speech and ambulation. - Neuro rehabilitation referral placed today - Encouraged stroke counseling groups - Tramadol for pain - Does not appear depressed, but should maintain vigilance

## 2011-08-14 NOTE — Assessment & Plan Note (Signed)
Blood pressure is reasonable today at [id number] systolic. Now on 5 antihypertensive medicines with good compliance. We will continue the current regimen for now.

## 2011-08-14 NOTE — Assessment & Plan Note (Addendum)
Patient describes difficulty initiating urination. Physical exam did not reveal enlarged prostate. Nonetheless, we'll do trial of Flomax.  - 0.4 mg, can titrate up at followup - RTC in one month for followup  Addendum: [facility].  Explained to pt that we don't have many other options, but that if it worsens we can refer him to urology for eval.  We should also get a PSA at next visit. - PSA at next visit

## 2011-08-14 NOTE — Assessment & Plan Note (Addendum)
Difficult to interpret scrotal exam, with multiple masses that appear separate from the testicles. Differential includes hydrocele, varicocele, hernia, less likely scrotal mass. - Scrotal ultrasound - RTC 1 month for followup - Refer as needed  Addendum: U/S shows epididymal cysts versus spermatoceles.  Nothing to do unless painful or bothersome, then can refer for possible removal.

## 2011-08-18 ENCOUNTER — Telehealth: Payer: Self-pay | Admitting: *Deleted

## 2011-08-18 NOTE — Telephone Encounter (Signed)
Pt states Flomax is not available at [facility]  and too expensive at [facility]. Can u change rx to less expensive med or something available at [facility]? Thanks

## 2011-08-19 ENCOUNTER — Ambulatory Visit (HOSPITAL_COMMUNITY)
Admission: RE | Admit: 2011-08-19 | Discharge: 2011-08-19 | Disposition: A | Discharge status: Home / Self Care | Payer: Self-pay | Source: Ambulatory Visit | Attending: Internal Medicine | Admitting: Internal Medicine

## 2011-08-19 DIAGNOSIS — N508 Other specified disorders of male genital organs: Secondary | ICD-10-CM | POA: Insufficient documentation

## 2011-08-19 DIAGNOSIS — N5089 Other specified disorders of the male genital organs: Secondary | ICD-10-CM

## 2011-08-19 NOTE — Telephone Encounter (Signed)
Hi Edward Morgan, I spoke with the pt today.  We are not going to rx anything new for now.  Thanks, Edward Morgan

## 2011-09-03 ENCOUNTER — Telehealth: Payer: Self-pay | Admitting: *Deleted

## 2011-09-03 MED ORDER — DESLORATADINE 5 MG PO TABS: 5.0000 mg | ORAL_TABLET | Freq: Every day | ORAL | Status: DC

## 2011-09-03 NOTE — Telephone Encounter (Signed)
Rx faxed in.

## 2011-09-03 NOTE — Telephone Encounter (Signed)
Refill approved by Dr. Stann Mainland- nurse to complete.

## 2011-09-03 NOTE — Telephone Encounter (Signed)
Request is for clarinex 5 mg daily. Not on med list but pt has had in past.

## 2011-09-15 ENCOUNTER — Encounter: Payer: Self-pay | Admitting: Internal Medicine

## 2011-09-16 NOTE — Addendum Note (Signed)
Addended by: Hulan Fray on: 09/16/2011 07:26 AM   Modules accepted: Orders

## 2011-09-18 ENCOUNTER — Other Ambulatory Visit: Payer: Self-pay | Admitting: Internal Medicine

## 2011-09-18 DIAGNOSIS — I619 Nontraumatic intracerebral hemorrhage, unspecified: Secondary | ICD-10-CM

## 2011-09-19 ENCOUNTER — Other Ambulatory Visit: Payer: Self-pay

## 2011-09-19 DIAGNOSIS — I639 Cerebral infarction, unspecified: Secondary | ICD-10-CM

## 2011-09-26 ENCOUNTER — Other Ambulatory Visit: Payer: Self-pay | Admitting: Internal Medicine

## 2011-09-26 DIAGNOSIS — I619 Nontraumatic intracerebral hemorrhage, unspecified: Secondary | ICD-10-CM

## 2011-09-30 ENCOUNTER — Encounter: Payer: Self-pay | Admitting: Internal Medicine

## 2011-09-30 ENCOUNTER — Ambulatory Visit (INDEPENDENT_AMBULATORY_CARE_PROVIDER_SITE_OTHER): Payer: Self-pay | Admitting: Internal Medicine

## 2011-09-30 VITALS — BP 128/76 | HR 61 | Temp 98.0°F | Ht 72.0 in | Wt 217.4 lb

## 2011-09-30 DIAGNOSIS — R413 Other amnesia: Secondary | ICD-10-CM

## 2011-09-30 DIAGNOSIS — I10 Essential (primary) hypertension: Secondary | ICD-10-CM

## 2011-09-30 DIAGNOSIS — E785 Hyperlipidemia, unspecified: Secondary | ICD-10-CM

## 2011-09-30 DIAGNOSIS — K219 Gastro-esophageal reflux disease without esophagitis: Secondary | ICD-10-CM

## 2011-09-30 MED ORDER — TAMSULOSIN HCL 0.4 MG PO CAPS: 0.4000 mg | ORAL_CAPSULE | Freq: Every day | ORAL | Status: DC

## 2011-09-30 MED ORDER — ESOMEPRAZOLE MAGNESIUM 40 MG PO CPDR: 40.0000 mg | DELAYED_RELEASE_CAPSULE | Freq: Two times a day (BID) | ORAL | Status: DC

## 2011-09-30 MED ORDER — ATENOLOL 100 MG PO TABS: 100.0000 mg | ORAL_TABLET | Freq: Every day | ORAL | Status: DC

## 2011-09-30 MED ORDER — HYDROCHLOROTHIAZIDE 25 MG PO TABS: 25.0000 mg | ORAL_TABLET | Freq: Every day | ORAL | Status: DC

## 2011-09-30 MED ORDER — PRAVASTATIN SODIUM 20 MG PO TABS: 20.0000 mg | ORAL_TABLET | Freq: Every day | ORAL | Status: DC

## 2011-09-30 MED ORDER — QUINAPRIL HCL 20 MG PO TABS: ORAL_TABLET | ORAL | Status: DC

## 2011-09-30 MED ORDER — CLONIDINE HCL 0.1 MG PO TABS: 0.1000 mg | ORAL_TABLET | Freq: Two times a day (BID) | ORAL | Status: DC

## 2011-09-30 MED ORDER — AMLODIPINE BESYLATE 10 MG PO TABS: 10.0000 mg | ORAL_TABLET | Freq: Every day | ORAL | Status: DC

## 2011-09-30 NOTE — Patient Instructions (Signed)
You were seen for some memory loss today. We are not sure if it is caused by your stroke and will check some lab tests today to see if there is another cause. We will bring you back in 1 month to discuss all the results and possible medication for your memory. Please feel free to call if you have any questions or problems at our office number: [phone]. We did not make any other changes to your medicines today.

## 2011-09-30 NOTE — Progress Notes (Signed)
Subjective:     Patient ID: Edward Morgan, male   DOB: [birth year], 61 y.o.   MRN: [id number]  HPI The patient is a 61 year old man who comes in with past medical history of hepatitis C, hypertension, GERD, recent stroke in December of 61. States that he is having some memory loss since then. He has noticed it mostly with her calling new events or events that have recently happened such as walking into a room and not remembering why he was there. He also mentioned that he occasionally has a hard time placing people he can remember her face but not in pain. He also states that he occasionally has problems with remote events as well. He had a girlfriend/wife in the room with him and the child so it was hard to discuss depression in depth. He has not noticed anything new or different. He states that this depression may have been a little bit progressive since September it is hard to say.  Review of Systems  Constitutional: Negative.   Respiratory: Negative.  Negative for cough, chest tightness, shortness of breath and wheezing.   Cardiovascular: Negative for chest pain, palpitations and leg swelling.  Gastrointestinal: Negative for nausea, vomiting, abdominal pain, diarrhea, constipation and blood in stool.  Musculoskeletal: Negative.   Skin: Negative.   Neurological: Positive for headaches. Negative for dizziness, tremors, seizures, syncope, facial asymmetry, speech difficulty, weakness, light-headedness and numbness.  Hematological: Negative.   Psychiatric/Behavioral: Positive for dysphoric mood and decreased concentration.    Mini MMSE: Patient didn't think it was July not August, 2012 not 2013. Knew he was at [facility]. Only remembered one of the 3 items at baseline and was able to recall the same one item at 5 minutes. With great prompting he was unable to remember all 3 items. He was unable to do serial sevens or spell world backwards. He was able to repeat no ifs, ands or buts and  did note a pencil and watch where. He was unable to follow the 3 stage command or unwilling. He was not focused enough to write a sentence or copy the design. Overall poor performance and poor score.    Objective:   Physical Exam  Constitutional: He is oriented to person, place, and time. He appears well-developed and well-nourished.  HENT:  Head: Normocephalic and atraumatic.  Eyes: EOM are normal. Pupils are equal, round, and reactive to light.  Neck: Normal range of motion. Neck supple.  Cardiovascular: Normal rate and regular rhythm.   Pulmonary/Chest: Effort normal and breath sounds normal.  Abdominal: Soft. Bowel sounds are normal.  Musculoskeletal: Normal range of motion.  Neurological: He is alert and oriented to person, place, and time.       Assessment/Plan:   1. Memory loss- will check TSH, B12, folate at today's visit to check for reversible causes of dementia. We'll have him back in one month to discuss results and to talk about depression is a more prominent source for his memory loss. He did not participate very well in MMSE  Today and it was incomplete. It may be reasonable to start him on antidepressant as a trial at next visit. If this does not decrease any improvement it may be reasonable to start him on Alzheimer's medication to prevent further decline if indeed he is having decline. More likely to be vascular dementia from stroke in December. Do not think that he needs repeat head CT as he did have one in December.   2. Disposition-the patient  will be seen back in one month for discussion of results of above testing for his memory. I think it would be reasonable at that time to trial an antidepressant to see if there is some reversible component to his memory loss. If there is not it may be reasonable to start an Alzheimer's medication to prevent further memory decay. Seems more likely to be vascular dementia which is not reversible and is not progressively declining. May be  reasonable to recheck MMSE at followup visit. Patient had poor participation in MMSE at today's visit.

## 2011-10-01 LAB — FOLATE RBC: RBC Folate: 826 ng/mL (ref >=366)

## 2011-10-08 ENCOUNTER — Ambulatory Visit: Payer: Self-pay

## 2011-10-08 ENCOUNTER — Encounter: Payer: Self-pay | Admitting: Occupational Therapy

## 2011-10-08 ENCOUNTER — Ambulatory Visit: Payer: Self-pay | Admitting: Physical Therapy

## 2011-10-08 NOTE — Addendum Note (Signed)
Addended by: Yvonna Alanis E on: 10/08/2011 06:00 PM   Modules accepted: Orders

## 2011-10-28 NOTE — Addendum Note (Signed)
Addended by: Hulan Fray on: 10/28/2011 07:02 PM   Modules accepted: Orders

## 2011-11-04 ENCOUNTER — Encounter: Payer: Self-pay | Admitting: Internal Medicine

## 2011-11-04 ENCOUNTER — Ambulatory Visit (INDEPENDENT_AMBULATORY_CARE_PROVIDER_SITE_OTHER): Payer: Medicaid Other | Admitting: Internal Medicine

## 2011-11-04 ENCOUNTER — Encounter (HOSPITAL_COMMUNITY): Payer: Self-pay

## 2011-11-04 ENCOUNTER — Emergency Department (HOSPITAL_COMMUNITY)
Admission: EM | Admit: 2011-11-04 | Discharge: 2011-11-04 | Disposition: A | Discharge status: Home / Self Care | Payer: Medicaid Other | Attending: Emergency Medicine | Admitting: Emergency Medicine

## 2011-11-04 VITALS — BP 127/79 | HR 57 | Temp 96.9°F | Ht 72.0 in | Wt 217.2 lb

## 2011-11-04 DIAGNOSIS — Z8673 Personal history of transient ischemic attack (TIA), and cerebral infarction without residual deficits: Secondary | ICD-10-CM | POA: Insufficient documentation

## 2011-11-04 DIAGNOSIS — M129 Arthropathy, unspecified: Secondary | ICD-10-CM | POA: Insufficient documentation

## 2011-11-04 DIAGNOSIS — E785 Hyperlipidemia, unspecified: Secondary | ICD-10-CM

## 2011-11-04 DIAGNOSIS — K219 Gastro-esophageal reflux disease without esophagitis: Secondary | ICD-10-CM | POA: Insufficient documentation

## 2011-11-04 DIAGNOSIS — R3911 Hesitancy of micturition: Secondary | ICD-10-CM

## 2011-11-04 DIAGNOSIS — B1920 Unspecified viral hepatitis C without hepatic coma: Secondary | ICD-10-CM | POA: Insufficient documentation

## 2011-11-04 DIAGNOSIS — R413 Other amnesia: Secondary | ICD-10-CM | POA: Insufficient documentation

## 2011-11-04 DIAGNOSIS — Z0000 Encounter for general adult medical examination without abnormal findings: Secondary | ICD-10-CM

## 2011-11-04 DIAGNOSIS — IMO0002 Reserved for concepts with insufficient information to code with codable children: Secondary | ICD-10-CM

## 2011-11-04 DIAGNOSIS — N259 Disorder resulting from impaired renal tubular function, unspecified: Secondary | ICD-10-CM

## 2011-11-04 DIAGNOSIS — I619 Nontraumatic intracerebral hemorrhage, unspecified: Secondary | ICD-10-CM

## 2011-11-04 DIAGNOSIS — X58XXXA Exposure to other specified factors, initial encounter: Secondary | ICD-10-CM | POA: Insufficient documentation

## 2011-11-04 DIAGNOSIS — I10 Essential (primary) hypertension: Secondary | ICD-10-CM

## 2011-11-04 DIAGNOSIS — M1990 Unspecified osteoarthritis, unspecified site: Secondary | ICD-10-CM

## 2011-11-04 DIAGNOSIS — Z23 Encounter for immunization: Secondary | ICD-10-CM

## 2011-11-04 LAB — URINE MICROSCOPIC-ADD ON

## 2011-11-04 LAB — URINALYSIS, ROUTINE W REFLEX MICROSCOPIC
Bilirubin Urine: NEGATIVE
Glucose, UA: NEGATIVE mg/dL
Hgb urine dipstick: NEGATIVE
Ketones, ur: NEGATIVE mg/dL
Protein, ur: 100 mg/dL — AB
pH: 6.5 (ref 5.0–8.0)

## 2011-11-04 LAB — CBC
HCT: 46.4 % (ref 39.0–52.0)
MCH: 29.8 pg (ref 26.0–34.0)
MCV: 88.2 fL (ref 78.0–100.0)
Platelets: 244 10*3/uL (ref 150–400)
RBC: 5.26 MIL/uL (ref 4.22–5.81)
RDW: 13.8 % (ref 11.5–15.5)
WBC: 6.6 10*3/uL (ref 4.0–10.5)

## 2011-11-04 MED ORDER — HYDROCODONE-ACETAMINOPHEN 5-325 MG PO TABS: 1.0000 | ORAL_TABLET | Freq: Four times a day (QID) | ORAL | Status: AC | PRN

## 2011-11-04 MED ORDER — TRAMADOL HCL 50 MG PO TABS: 50.0000 mg | ORAL_TABLET | Freq: Four times a day (QID) | ORAL | Status: DC | PRN

## 2011-11-04 MED ORDER — HYDROMORPHONE HCL PF 1 MG/ML IJ SOLN: 0.5000 mg | Freq: Once | INTRAMUSCULAR | Status: DC

## 2011-11-04 MED ORDER — HYDROMORPHONE HCL PF 1 MG/ML IJ SOLN
1.0000 mg | Freq: Once | INTRAMUSCULAR | Status: AC
  Administered 2011-11-04: 1 mg via INTRAMUSCULAR
  Filled 2011-11-04: qty 1

## 2011-11-04 NOTE — Assessment & Plan Note (Addendum)
Still reports minor difficulty in urination at times.  Was unable to start Flomax in the past due to lack of insurance however now has insurance and is considering restarting but would like to wait at this time.  Discussed PSA testing in detail for prostate cancer screening measure, Edward Morgan elected to have PSA measured this visit.  PSA= 0.88 11/04/11  -Continue to monitor

## 2011-11-04 NOTE — Patient Instructions (Signed)
Please follow up with neurology and return to clinic in one month for follow up visit  Please purchase cane and use as described to assist with balance  Please allow social work to evaluate home and need for home health services  Continue taking medications

## 2011-11-04 NOTE — Assessment & Plan Note (Signed)
Presented to the emergency room this morning for worsening back pain.  Was given prescription for Norco.  He is also on tramadol.  -Continue pain medication -Continue to monitor

## 2011-11-04 NOTE — ED Notes (Signed)
Pt states he has been having sharp pains in the left side of his back radiating down his leg and into his testicle. States it hurts a little bit when he tries to void which started yesterday.

## 2011-11-04 NOTE — Assessment & Plan Note (Signed)
Complains of diffuse body pain worse upon waking up.   -Continue tramadol -Continue to monitor

## 2011-11-04 NOTE — Progress Notes (Signed)
Subjective:   Patient ID: Edward Morgan male   DOB: September 22, 1950 61 y.o.   MRN: [id number]  HPI: Edward Morgan is a 61 y.o. African American male with past medical history depression, hypertension, hepatitis C, GERD, arthritis, and hemorrhagic stroke December 2012 presenting to clinic today for routine followup visit and complaining of worsening memory loss status post stroke.  Edward Morgan was seen in clinic today alongside his wife from who he is separated but she still hopes to take care of him.  He continues to have residual right-sided weakness status post stroke and is also complaining of worsening memory loss and confusion.  Edward Morgan claims he is unable to be at home alone and is requesting home health care at this time. He lives at home with his elderly mother.  He reports leaving the stove on multiple times in the past and no longer cooks.  He is unable to remember names sometimes and has difficulty recalling things he used to do very easily in the past.  He also reports unsteady gait and walks leaning towards the left at times.  He does have Medicaid now and would like to try to arrange home health care nursing support.  He has no other major complaints at this time. He denies any fever, chills, headaches, nausea, vomiting, chest pain, shortness of breath, abdominal pain at this time.   Past Medical History  Diagnosis Date  . Depression   . Hypertension   . 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  [name] GERD (gastroesophageal reflux disease)   . Arthritis   . H. pylori infection     history of  . Shortness of breath   . Stroke 01/27/2011   Current Outpatient Prescriptions  Medication Sig Dispense Refill  . amLODipine (NORVASC) 10 MG tablet Take 10 mg by mouth daily.      [name] aspirin EC 81 MG tablet Take 81 mg by mouth daily.      [name] atenolol (TENORMIN) 100 MG tablet  Take 100 mg by mouth daily.      . cloNIDine (CATAPRES) 0.1 MG tablet Take 0.1 mg by mouth 2 (two) times daily.      [name] esomeprazole (NEXIUM) 40 MG capsule Take 40 mg by mouth 2 (two) times daily.      . hydrochlorothiazide (HYDRODIURIL) 25 MG tablet Take 25 mg by mouth daily.      [name] HYDROcodone-acetaminophen (NORCO) 5-325 MG per tablet Take 1 tablet by mouth every 6 (six) hours as needed for pain.  15 tablet  0  . Multiple Vitamins-Minerals (MENS MULTI VITAMIN & MINERAL) TABS Take 1 tablet by mouth daily.      . pravastatin (PRAVACHOL) 20 MG tablet Take 20 mg by mouth daily.      . quinapril (ACCUPRIL) 20 MG tablet Take 20-40 mg by mouth 2 (two) times daily. 40mg  in morning., 20mg  in evening.      . traMADol (ULTRAM) 50 MG tablet Take 1 tablet (50 mg total) by mouth every 6 (six) hours as needed.  30 tablet  0  . DISCONTD: amLODipine (NORVASC) 10 MG tablet Take 1 tablet (10 mg total) by mouth daily.  30 tablet  2  . DISCONTD: atenolol (TENORMIN) 100 MG tablet Take 1 tablet (100 mg total) by mouth daily.  30 tablet  6  . DISCONTD: cloNIDine (  CATAPRES) 0.1 MG tablet Take 1 tablet (0.1 mg total) by mouth 2 (two) times daily.  60 tablet  11  . DISCONTD: esomeprazole (NEXIUM) 40 MG capsule Take 1 capsule (40 mg total) by mouth 2 (two) times daily.  60 capsule  2  . DISCONTD: hydrochlorothiazide (HYDRODIURIL) 25 MG tablet Take 1 tablet (25 mg total) by mouth daily.  30 tablet  2  . DISCONTD: pravastatin (PRAVACHOL) 20 MG tablet Take 1 tablet (20 mg total) by mouth daily.  30 tablet  2  . DISCONTD: quinapril (ACCUPRIL) 20 MG tablet 40mg  in a.m., 20mg  in pm.  Dispense 20mg  pills.  180 tablet  2   No current facility-administered medications for this visit.   Facility-Administered Medications Ordered in Other Visits  Medication Dose Route Frequency Provider Last Rate Last Dose  . HYDROmorphone (DILAUDID) injection 1 mg  1 mg Intramuscular Once Pamella Pert, MD   1 mg at 11/04/11 0826  . DISCONTD:  HYDROmorphone (DILAUDID) injection 0.5 mg  0.5 mg Intramuscular Once Pamella Pert, MD       Family History  Problem Relation Age of Onset  . Hypertension Mother   . Hypertension Father   . Heart attack Father 41  . Stroke Father     late 30s  . Diabetes Brother    History   Social History  . Marital Status: Married    Spouse Name: N/A    Number of Children: N/A  . Years of Education: N/A   Social History Main Topics  . Smoking status: Never Smoker   . Smokeless tobacco: Never Used  . Alcohol Use: No  . Drug Use: No     used to be a IDU.  [name] Sexually Active: None   Other Topics Concern  . None   Social History Narrative   Financial assistance approved for 100% discount at [facility] and has [employer]  March 12, 2010 5:55 PMDivorced and single currently.Was in prison in 21's.   Review of Systems: Constitutional: Denies fever, chills, diaphoresis, appetite change and fatigue.  HEENT: Denies photophobia, eye pain, redness, hearing loss, ear pain, congestion, sore throat, rhinorrhea, sneezing, mouth sores, trouble swallowing, neck pain, neck stiffness and tinnitus.   Respiratory: Denies SOB, DOE, cough, chest tightness,  and wheezing.   Cardiovascular: Denies chest pain, palpitations and leg swelling.  Gastrointestinal: Denies nausea, vomiting, abdominal pain, diarrhea, constipation, blood in stool and abdominal distention.  Genitourinary: Difficulty urinating at times.  Denies dysuria, urgency, frequency, hematuria, flank pain. Musculoskeletal: Diffuse body aches .  Skin: Denies pallor, rash and wound.  Neurological: + Memory loss, unsteady gait, right-sided weakness.  Denies dizziness, seizures, syncope, light-headedness, numbness and headaches.  Hematological: Denies adenopathy. Easy bruising, personal or family bleeding history  Psychiatric/Behavioral: Denies suicidal ideation, mood changes, confusion, nervousness, sleep disturbance and agitation  Objective:    Physical Exam: Filed Vitals:   11/04/11 1413  BP: 127/79  Pulse: 57  Temp: 96.9 F (36.1 C)  TempSrc: Oral  Height: 6' (1.829 m)  Weight: 217 lb 3.2 oz (98.521 kg)  SpO2: 99%   Constitutional: Vital signs reviewed.  Patient is a well-developed and well-nourished male in no acute distress and cooperative with exam. Alert and oriented x3.  Head: Normocephalic and atraumatic Ear: TM normal bilaterally Mouth: no erythema or exudates, MMM Eyes: PERRLA, EOMI, No scleral icterus.  Neck: Supple, Trachea midline normal ROM Cardiovascular: RRR, S1 normal, S2 normal, no MRG, pulses symmetric and intact bilaterally Pulmonary/Chest: CTAB, no wheezes, rales,  or rhonchi Abdominal: Soft. Non-tender, non-distended, bowel sounds are normal, no masses, organomegaly, or guarding present.  GU: no CVA tenderness Musculoskeletal: Diffuse bodyaches. Back pain. No joint deformities, erythema, or stiffness. Hematology: no cervical, inginal, or axillary adenopathy.  Neurological: A&O x3, Strength is weaker on the right upper and lower extremities than left. cranial nerve II-XII are grossly intact, unsteady gait leaning to the left, sensory intact to light touch bilaterally.  Skin: Warm, dry and intact. No rash, cyanosis, or clubbing.  Psychiatric: hx of depression.  Normal mood and affect. Behavior is normal. Judgment and thought content normal. Cognition and mentation slow, difficulty concentrating, MMSE done, slow to spell words backwards and serial seven subtractions but eventually able to give correct answer.  Easier to spell words upon looking at them.  Reports short term memory loss and increased confusion.   Assessment & Plan:

## 2011-11-04 NOTE — ED Provider Notes (Signed)
History     CSN: [id number]  Arrival date & time 11/04/11  [id number]   First MD Initiated Contact with Patient 11/04/11 [phone]      Chief Complaint  Patient presents with  . Back Pain    (Consider location/radiation/quality/duration/timing/severity/associated sxs/prior treatment) Patient is a 61 y.o. male presenting with back pain. The history is provided by the patient.  Back Pain  This is a chronic (w acute worsening) problem. The current episode started 6 to 12 hours ago. The problem occurs constantly. The problem has not changed since onset.The pain is associated with no known injury. The pain is present in the lumbar spine. The quality of the pain is described as stabbing. The pain radiates to the left thigh (and left testicle). The pain is at a severity of 8/10. The pain is moderate. The symptoms are aggravated by twisting and certain positions. The pain is the same all the time. Pertinent negatives include no chest pain, no fever, no numbness, no headaches, no abdominal pain, no bladder incontinence, no dysuria, no paresthesias, no tingling and no weakness. Treatments tried: tramadol. The treatment provided mild relief.    Past Medical History  Diagnosis Date  . Depression   . Hypertension   . 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  [name] GERD (gastroesophageal reflux disease)   . Arthritis   . H. pylori infection     history of  . Shortness of breath   . Stroke 01/27/2011    Past Surgical History  Procedure Date  . Rotator cuff repair   . Closed reduction, percutaneous pinning of right 5th finger,   . Left knee surgery   . Colonoscopy 10/09    by Rittman: showed sigmoid Diverticulosis    Family History  Problem Relation Age of Onset  . Hypertension Mother   . Hypertension Father   . Heart attack Father 47  . Stroke Father     late 48s  . Diabetes Brother       History  Substance Use Topics  . Smoking status: Never Smoker   . Smokeless tobacco: Never Used  . Alcohol Use: No      Review of Systems  Constitutional: Negative for fever.  HENT: Negative for rhinorrhea, drooling and neck pain.   Eyes: Negative for pain.  Respiratory: Negative for cough and shortness of breath.   Cardiovascular: Negative for chest pain and leg swelling.  Gastrointestinal: Negative for nausea, vomiting, abdominal pain and diarrhea.  Genitourinary: Negative for bladder incontinence, dysuria and hematuria.  Musculoskeletal: Positive for back pain. Negative for gait problem.  Skin: Negative for color change.  Neurological: Negative for tingling, weakness, numbness, headaches and paresthesias.  Hematological: Negative for adenopathy.  Psychiatric/Behavioral: Negative for behavioral problems.  All other systems reviewed and are negative.    Allergies  Penicillins  Home Medications   Current Outpatient Rx  Name Route Sig Dispense Refill  . AMLODIPINE BESYLATE 10 MG PO TABS Oral Take 10 mg by mouth daily.    . ASPIRIN EC 81 MG PO TBEC Oral Take 81 mg by mouth daily.    . ATENOLOL 100 MG PO TABS Oral Take 100 mg by mouth daily.    [name] CLONIDINE HCL 0.1 MG PO TABS Oral Take 0.1 mg by mouth 2 (two) times daily.    [name] ESOMEPRAZOLE MAGNESIUM 40 MG PO CPDR Oral  Take 40 mg by mouth 2 (two) times daily.    [name] HYDROCHLOROTHIAZIDE 25 MG PO TABS Oral Take 25 mg by mouth daily.    [name] MENS MULTI VITAMIN & MINERAL PO TABS Oral Take 1 tablet by mouth daily.    [name] PRAVASTATIN SODIUM 20 MG PO TABS Oral Take 20 mg by mouth daily.    . QUINAPRIL HCL 20 MG PO TABS Oral Take 20-40 mg by mouth 2 (two) times daily. 40mg  in morning., 20mg  in evening.    [name] HYDROCODONE-ACETAMINOPHEN 5-325 MG PO TABS Oral Take 1 tablet by mouth every 6 (six) hours as needed for pain. 15 tablet 0  . TRAMADOL HCL 50 MG PO TABS Oral Take 1 tablet (50 mg total) by mouth every 6 (six) hours as needed. 30  tablet 0    BP 147/84  Pulse 68  Temp 98.5 F (36.9 C) (Oral)  Resp 22  SpO2 100%  Physical Exam  Nursing note and vitals reviewed. Constitutional: He is oriented to person, place, and time. He appears well-developed and well-nourished.  HENT:  Head: Normocephalic and atraumatic.  Right Ear: External ear normal.  Left Ear: External ear normal.  Nose: Nose normal.  Mouth/Throat: Oropharynx is clear and moist. No oropharyngeal exudate.  Eyes: Conjunctivae and EOM are normal. Pupils are equal, round, and reactive to light.  Neck: Normal range of motion. Neck supple.  Cardiovascular: Normal rate, regular rhythm, normal heart sounds and intact distal pulses.  Exam reveals no gallop and no friction rub.   No murmur heard. Pulmonary/Chest: Effort normal and breath sounds normal. No respiratory distress. He has no wheezes.  Abdominal: Soft. Bowel sounds are normal. He exhibits no distension. There is no tenderness. There is no rebound and no guarding.  Genitourinary: Penis normal.       No evidence of hernia on genital/abd exam. No testicular ttp. Normal cremasteric reflex bilaterally.   Musculoskeletal: Normal range of motion. He exhibits tenderness (mild to mod ttp of left lower lumbar paraspinal area). He exhibits no edema.       Pain reproducible w/ twisting of torso.   Neurological: He is alert and oriented to person, place, and time. He has normal strength. No sensory deficit.  Reflex Scores:      Patellar reflexes are 2+ on the right side and 2+ on the left side.      Achilles reflexes are 2+ on the right side and 2+ on the left side. Skin: Skin is warm and dry.  Psychiatric: He has a normal mood and affect. His behavior is normal.    ED Course  Procedures (including critical care time)  Labs Reviewed  URINALYSIS, ROUTINE W REFLEX MICROSCOPIC - Abnormal; Notable for the following:    Protein, ur 100 (*)     All other components within normal limits  URINE MICROSCOPIC-ADD ON    No results found.   1. Back sprain or strain       MDM  10:39 PM 61 y.o. male w hx of Hep C, CVA w/ mild gait disturbance, and chronic back pain pw acute worsening of back pain. Pt notes mild worsening or back pain x 1 week w/ acute worsening this AM when he got up out of bed to urinate. Pt denies back pain red flags, AFVSS here, denies numb/tingling, normal motor/sensory on exam. Normal genital exam. Suspect MSK pain as it is reproducible w/ palpation and movement of torso. Will get UA and pain control.   : Pt feeling  better on exam.  I have discussed the diagnosis/risks/treatment options with the patient and believe the pt to be eligible for discharge home to follow-up with pcp in 1-2 days if no better. We also discussed returning to the ED immediately if new or worsening sx occur. We discussed the sx which are most concerning (e.g., worsening pain, fever, back pain red flags) that necessitate immediate return. Any new prescriptions provided to the patient are listed below.  New Prescriptions   HYDROCODONE-ACETAMINOPHEN (NORCO) 5-325 MG PER TABLET    Take 1 tablet by mouth every 6 (six) hours as needed for pain.   Clinical Impression 1. Back sprain or strain          Pamella Pert, MD 11/04/11 2239

## 2011-11-04 NOTE — Assessment & Plan Note (Signed)
Hemorrhagic stroke December 2012.  Positive residual right-sided weakness.  Unsteady gait.  Complains of short-term memory loss since stroke.  Edward Morgan is requesting home assistance do to unable to provide for himself and his mother by himself alone.  He is often forgetful.    -Followup referral to neurology -Followup referral to social worker to assess home situation as patient does not feel comfortable at home without assistance.   -Will buy a Cane for help with balance -Continue to monitor -Followup in clinic

## 2011-11-04 NOTE — Assessment & Plan Note (Signed)
Blood pressure today 127/79  -Continue amlodipine, atenolol, clonidine, hydrochlorothiazide, quinapril -Continue to monitor

## 2011-11-04 NOTE — Assessment & Plan Note (Signed)
HDL today 32, LDL 81  -Continue pravastatin -Continue to monitor

## 2011-11-04 NOTE — ED Notes (Signed)
Dr. Harrison at the bedside. 

## 2011-11-04 NOTE — Assessment & Plan Note (Signed)
Mainly complaining of short-term memory loss status post stroke in December 2012.  Worsening over time.  Claims he gets confused very easily, unable to remember names and faces, leaves stove on in kitchen.  -Refer to neurology -Refer to social worker for evaluation of home health care needs -He is requesting home health care assistance -Continue to monitor -TSH B12 and folate within normal limits -Will not start antidepressants at this time, will followup with neurology recommendations

## 2011-11-04 NOTE — Assessment & Plan Note (Addendum)
-  Followup CMP -Return to clinic for followup -Continue to monitor

## 2011-11-05 LAB — COMPLETE METABOLIC PANEL WITH GFR
ALT: 45 U/L (ref 0–53)
AST: 39 U/L — ABNORMAL HIGH (ref 0–37)
Albumin: 4.2 g/dL (ref 3.5–5.2)
Alkaline Phosphatase: 64 U/L (ref 39–117)
Calcium: 9.7 mg/dL (ref 8.4–10.5)
Chloride: 100 mEq/L (ref 96–112)
Potassium: 3.5 mEq/L (ref 3.5–5.3)

## 2011-11-05 LAB — PSA: PSA: 0.88 ng/mL (ref <=4.00)

## 2011-11-06 NOTE — ED Provider Notes (Signed)
I saw and evaluated the patient, reviewed the resident's note and I agree with the findings and plan.  Edward Biles, MD 11/06/11 [phone]

## 2011-11-06 NOTE — Progress Notes (Signed)
INTERNAL MEDICINE TEACHING ATTENDING ADDENDUM - Dominic Pea, DO : I personally saw and evaluated Edward Morgan,  in this clinic visit in conjunction with the resident, Dr. Eula Fried. I have discussed patient's plan of care with medical resident during this visit. I have confirmed the physical exam findings and have read and agree with the clinic note including the plan.

## 2011-11-07 ENCOUNTER — Telehealth: Payer: Self-pay | Admitting: Licensed Clinical Social Worker

## 2011-11-07 NOTE — Telephone Encounter (Signed)
Edward Morgan was referred to CSW for home health and care evaluation as pt voiced difficulty with his ADL's during his [facility] appt.  CSW placed call to discuss if pt was wanting an evaluation for PCS.  CSW left message and provided CSW contact information, requesting return call to confirm pt's needs.

## 2011-11-10 NOTE — Telephone Encounter (Signed)
CSW received partially completed PCS referral for Mr. Edward Morgan.  Pt requesting copy to be mailed when completed. PCS Referral form initiated.

## 2011-12-02 ENCOUNTER — Telehealth: Payer: Self-pay | Admitting: *Deleted

## 2011-12-02 ENCOUNTER — Ambulatory Visit: Payer: Medicaid Other | Admitting: Internal Medicine

## 2011-12-02 NOTE — Telephone Encounter (Signed)
Refill request for clarinex 5 mg  Take daily.  This was taken off med list.  Will you refill?

## 2011-12-03 NOTE — Telephone Encounter (Signed)
Who is asking for refill? Does he need it? It was probably taken off because he does not take it?

## 2011-12-04 NOTE — Telephone Encounter (Signed)
Pt called and med NOT requested by him.  I had [facility] d/c med

## 2011-12-10 ENCOUNTER — Other Ambulatory Visit: Payer: Self-pay | Admitting: Internal Medicine

## 2011-12-11 ENCOUNTER — Other Ambulatory Visit: Payer: Self-pay | Admitting: *Deleted

## 2011-12-11 NOTE — Telephone Encounter (Signed)
Patient previously with Rx for 50 tabs, no mention of reason for decrease in recent note, so will give Rx for 50 tabs X 1 w/o refills and defer further refills to Dr. Eula Fried

## 2011-12-11 NOTE — Telephone Encounter (Signed)
Pt states he would like to start getting #50 again, 30 is not enough

## 2011-12-11 NOTE — Telephone Encounter (Signed)
Review.

## 2011-12-12 ENCOUNTER — Other Ambulatory Visit: Payer: Self-pay | Admitting: *Deleted

## 2011-12-15 MED ORDER — ESOMEPRAZOLE MAGNESIUM 40 MG PO CPDR: 40.0000 mg | DELAYED_RELEASE_CAPSULE | Freq: Two times a day (BID) | ORAL | Status: DC

## 2011-12-15 NOTE — Telephone Encounter (Signed)
On his last visit on 11/04/2011, the patient was asked to follow up with the clinic in one month, date which is past overdue now. Please arrange for a clinic visit with your PCP for further medication orders. Thanks.

## 2011-12-17 NOTE — Telephone Encounter (Signed)
Rx faxed in Request for front desk to make appointment

## 2012-01-12 ENCOUNTER — Other Ambulatory Visit: Payer: Self-pay | Admitting: *Deleted

## 2012-01-12 MED ORDER — ESOMEPRAZOLE MAGNESIUM 40 MG PO CPDR: 40.0000 mg | DELAYED_RELEASE_CAPSULE | Freq: Two times a day (BID) | ORAL | Status: DC

## 2012-01-12 NOTE — Telephone Encounter (Signed)
Rx called in to pharmacy. 

## 2012-01-16 ENCOUNTER — Ambulatory Visit (INDEPENDENT_AMBULATORY_CARE_PROVIDER_SITE_OTHER): Payer: Medicaid Other | Admitting: Family Medicine

## 2012-01-16 ENCOUNTER — Encounter: Payer: Self-pay | Admitting: Family Medicine

## 2012-01-16 VITALS — BP 151/80 | HR 60 | Ht 72.0 in | Wt 215.0 lb

## 2012-01-16 DIAGNOSIS — M25529 Pain in unspecified elbow: Secondary | ICD-10-CM

## 2012-01-16 DIAGNOSIS — M7710 Lateral epicondylitis, unspecified elbow: Secondary | ICD-10-CM

## 2012-01-16 MED ORDER — MELOXICAM 15 MG PO TABS: 15.0000 mg | ORAL_TABLET | Freq: Every day | ORAL | Status: DC

## 2012-01-16 NOTE — Assessment & Plan Note (Signed)
Patient does have findings clinically and on ultrasound imaging that is responding to lateral epicondylitis. Patient is artery had 2 corticosteroid injections over the course of last 12 months and did have improvement but is concerned for tendon rupture if we continue with steroid injections. Patient was given a body helix compression sleeve to help with any type of swelling. Patient is given meloxicam which she'll take daily for the next 2 weeks then as needed thereafter. Patient will also start formal physical therapy for other modalities such as iron to pheresis that could be very beneficial. Encourage patient to ice 20 minutes 4-5 times a day. Patient was also given a handout of different stretches to do a regular basis. Patient will try these and come back in 4-6 weeks for further evaluation. At that time we should rescan patient with the ultrasound and is still in considerable amount of pain we may need to consider referral to orthopedic surgery.

## 2012-01-16 NOTE — Patient Instructions (Signed)
Very nice to meet you. You do have lateral epicondylitis. Am giving you some exercises that when she to do on a regular basis. Please do them at least daily. We are also going to 2 and a physical therapy. They will call you for the scheduling of your appointment. I'm giving you a brace when she to try to wear to see if this helps. I'm also giving you a medicine called meloxicam.  Take one pill daily for the next 2 weeks then as needed thereafter.  I want you to come back in 3-4 weeks to make sure you're doing better.

## 2012-01-16 NOTE — Progress Notes (Signed)
Chief complaint: Left elbow pain  History of present illness the patient is a pleasant 61 year old male following up for left elbow pain. Patient was seen previously in May 2013 when he did have a steroid injection into the lateral epicondylar area for presumed lateral epicondylitis. This is patient's second such injection over the course of last 12 months. Patient states unfortunately that the pain has started to come back especially over the course of last month. Patient states that it still hurts in the same area on the lateral aspect of his elbow without radiation. Patient has tried wearing the brace before but he has lost it over the course of last 2 months when he was feeling better. Patient has tried some oral analgesics over-the-counter with minimal benefit. Patient is not doing any exercises and has not done any physical therapy.  Review of systems: 14 system review is unremarkable as related to the orthopedic problem  Past medical history, social, surgical and family history all reviewed.   Physical exam Blood pressure 151/80, pulse 60, height 6' (1.829 m), weight 215 lb (97.523 kg). General: No apparent distress alert and oriented x3 mood and affect somewhat normal but blunted. Respiratory: Patient's recent full sentences and does not appear short of breath Skin: Warm dry intact with no signs of infection or rash Neuro: Cranial nerves II through XII are intact, neurovascularly intact in all extremities with 2+ DTRs and 2+ pulses. Left elbow exam: Patient does have trace effusion over the lateral condyle. Patient has full range of motion in flexion extension pronation and supination. He has significant tenderness to palpation over the lateral epicondyle with significant pain with resisted extension of the middle finger. Stable to varus and valgus stress.  Muscle skeletal ultrasound: This was done and interpreted by me today. His limited ultrasound does show that he still has a small  avulsion fracture of the lateral condyle with significant hypoechoic changes surrounding the deep common extensor tendon origin. There is increased amount of Doppler flow corresponding to neovascularization.

## 2012-02-03 ENCOUNTER — Other Ambulatory Visit: Payer: Self-pay | Admitting: *Deleted

## 2012-02-03 MED ORDER — ESOMEPRAZOLE MAGNESIUM 40 MG PO CPDR: 40.0000 mg | DELAYED_RELEASE_CAPSULE | Freq: Two times a day (BID) | ORAL | Status: DC

## 2012-02-03 NOTE — Telephone Encounter (Signed)
Rx called in to pharmacy. 

## 2012-02-10 ENCOUNTER — Ambulatory Visit (INDEPENDENT_AMBULATORY_CARE_PROVIDER_SITE_OTHER): Payer: Medicaid Other | Admitting: Internal Medicine

## 2012-02-10 ENCOUNTER — Encounter: Payer: Self-pay | Admitting: Internal Medicine

## 2012-02-10 VITALS — BP 142/83 | HR 59 | Temp 97.5°F | Ht 72.0 in | Wt 222.4 lb

## 2012-02-10 DIAGNOSIS — I10 Essential (primary) hypertension: Secondary | ICD-10-CM

## 2012-02-10 DIAGNOSIS — R413 Other amnesia: Secondary | ICD-10-CM

## 2012-02-10 MED ORDER — HYDROCHLOROTHIAZIDE 25 MG PO TABS: 25.0000 mg | ORAL_TABLET | Freq: Every day | ORAL | Status: DC

## 2012-02-10 MED ORDER — TRAMADOL HCL 50 MG PO TABS: 50.0000 mg | ORAL_TABLET | Freq: Three times a day (TID) | ORAL | Status: DC | PRN

## 2012-02-10 MED ORDER — AMLODIPINE BESYLATE 10 MG PO TABS: 10.0000 mg | ORAL_TABLET | Freq: Every day | ORAL | Status: DC

## 2012-02-10 MED ORDER — QUINAPRIL HCL 20 MG PO TABS: 20.0000 mg | ORAL_TABLET | Freq: Two times a day (BID) | ORAL | Status: DC

## 2012-02-10 NOTE — Progress Notes (Signed)
Subjective:   Patient ID: Edward Morgan male   DOB: [birth year] 61 y.o.   MRN: [id number]  HPI: [name] is a 61 y.o. African American male with past medical history depression, hypertension, hepatitis C, GERD, arthritis, and hemorrhagic stroke December 2012 presenting to clinic today for routine followup visit and medication refills.  Since his last visit, his complaints of memory loss are stable, not much change, however he feels more comfortable at home since he now has home health nurse/aid that helps with his daily AVLs and cooking.  His ex-wife is present with him again today.  He claims to be complaint with all his medications and has no complaints at this time.  He denies any headaches, N/V/D, fever, chills, chest pain, shortness of breath, abdominal pain, or constipation at this time.    Past Medical History  Diagnosis Date  . Depression   . Hypertension   . 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  [name] GERD (gastroesophageal reflux disease)   . Arthritis   . H. pylori infection     history of  . Shortness of breath   . Stroke 01/27/2011   Current Outpatient Prescriptions  Medication Sig Dispense Refill  . amLODipine (NORVASC) 10 MG tablet Take 1 tablet (10 mg total) by mouth daily.  30 tablet  2  . aspirin EC 81 MG tablet Take 81 mg by mouth daily.      [name] atenolol (TENORMIN) 100 MG tablet Take 100 mg by mouth daily.      . cloNIDine (CATAPRES) 0.1 MG tablet Take 0.1 mg by mouth 2 (two) times daily.      [name] esomeprazole (NEXIUM) 40 MG capsule Take 1 capsule (40 mg total) by mouth 2 (two) times daily.  60 capsule  5  . hydrochlorothiazide (HYDRODIURIL) 25 MG tablet Take 1 tablet (25 mg total) by mouth daily.  30 tablet  3  . Multiple Vitamins-Minerals (MENS MULTI VITAMIN & MINERAL) TABS Take 1 tablet by mouth daily.      . pravastatin (PRAVACHOL) 20 MG tablet  Take 20 mg by mouth daily.      . quinapril (ACCUPRIL) 20 MG tablet Take 1-2 tablets (20-40 mg total) by mouth 2 (two) times daily. 40mg  in morning., 20mg  in evening.  90 tablet  2  . traMADol (ULTRAM) 50 MG tablet Take 1 tablet (50 mg total) by mouth every 8 (eight) hours as needed for pain.  90 tablet  1   Family History  Problem Relation Age of Onset  . Hypertension Mother   . Hypertension Father   . Heart attack Father 35  . Stroke Father     late 54s  . Diabetes Brother    History   Social History  . Marital Status: Married    Spouse Name: N/A    Number of Children: N/A  . Years of Education: N/A   Social History Main Topics  . Smoking status: Never Smoker   . Smokeless tobacco: Never Used  . Alcohol Use: No  . 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  [name] Sexually Active: No   Other Topics Concern  . None   Social History Narrative   Financial assistance approved [facility] and [facility]  March 12, 2010 5:55 PMDivorced and single  currently.Was in prison in 55's.   Review of Systems:  Constitutional: Denies fever, chills, diaphoresis, appetite change and fatigue.  HEENT: Denies photophobia, eye pain, redness, hearing loss, ear pain, congestion, sore throat, rhinorrhea, sneezing, mouth sores, trouble swallowing, neck pain, neck stiffness and tinnitus.  Respiratory: Denies SOB, DOE, cough, chest tightness, and wheezing.  Cardiovascular: Denies chest pain, palpitations and leg swelling.  Gastrointestinal: Denies nausea, vomiting, abdominal pain, diarrhea, constipation, blood in stool and abdominal distention.  Genitourinary: Difficulty urinating at times. Denies dysuria, urgency, frequency, hematuria, flank pain.  Musculoskeletal: Diffuse body aches .  Skin: Denies pallor, rash and wound.  Neurological: + Memory loss, gait improved, walks with cane at home, right-sided weakness. Denies dizziness, seizures, syncope, light-headedness,  numbness and headaches.  Hematological: Denies adenopathy. Easy bruising, personal or family bleeding history  Psychiatric/Behavioral: Denies suicidal ideation, mood changes, confusion, nervousness, sleep disturbance and agitation  Objective:  Physical Exam: Filed Vitals:   02/10/12 1322 02/10/12 1359  BP: 140/79 142/83  Pulse: 60 59  Temp: 97.5 F (36.4 C)   TempSrc: Oral   Height: 6' (1.829 m)   Weight: 222 lb 6.4 oz (100.88 kg)   SpO2: 99%    Constitutional: Vital signs reviewed.  Patient is a well-developed and well-nourished male in no acute distress and cooperative with exam. Alert and oriented x3.  Head: Normocephalic and atraumatic Ear: TM normal bilaterally Mouth: no erythema or exudates, MMM Eyes: PERRLA, EOMI, conjunctivae normal, No scleral icterus.  Neck: Supple, Trachea midline normal ROM, No JVD, mass, thyromegaly, or carotid bruit present.  Cardiovascular: RRR, S1 normal, S2 normal, no MRG, pulses symmetric and intact bilaterally Pulmonary/Chest: CTAB, no wheezes, rales, or rhonchi Abdominal: Soft. Non-tender, non-distended, bowel sounds are normal, no masses, organomegaly, or guarding present.  GU: no CVA tenderness Musculoskeletal: No joint deformities, erythema, or stiffness, ROM full and no nontender Hematology: no cervical, inginal, or axillary adenopathy.  Neurological: A&O x3, Strength is normal and symmetric bilaterally, cranial nerve II-XII are grossly intact, no focal motor deficit, sensory intact to light touch bilaterally.  Skin: Warm, dry and intact. No rash, cyanosis, or clubbing.  Psychiatric: Normal mood and affect. speech and behavior is normal. Judgment and thought content normal. Cognition and memory are normal.   Assessment & Plan:  Discussed with Dr. Lynnae January Refilled medications -f/u 6 months

## 2012-02-10 NOTE — Assessment & Plan Note (Signed)
Stable.  Has home health nurse at the house now that helps with ADLs, cooking, and he feels better now and more comfortable at home.    -continue to monitor

## 2012-02-10 NOTE — Assessment & Plan Note (Addendum)
Goals (1 Years of Data) as of 02/10/2012          As of Today 01/16/12 11/04/11 11/04/11 09/30/11     Blood Pressure    . Blood Pressure < 140/90  140/79 151/80 127/79 147/84 128/76     Goals (1 Years of Data) as of 02/10/2012          As of Today As of Today 01/16/12 11/04/11 11/04/11     Blood Pressure    . Blood Pressure < 140/90  142/83 140/79 151/80 127/79 147/84         Note created  02/10/2012  5:31 PM by Jerene Pitch, MD    BP today 140/79--claims he was a little anxious coming to Dr. Gabriel Carina and when in pain          Compliant with medications.  Slightly elevated from goal.  Was anxious at Dr. Gabriel Carina at the beginning.    -continue atenolol, clonidine, hctz, and accupril -refills given

## 2012-02-10 NOTE — Patient Instructions (Signed)
Please check your blood pressure at home and continue to take your BP medications.    We have refilled your medications.  Please call the clinic [phone] in the future with any problems with your medications

## 2012-02-11 ENCOUNTER — Other Ambulatory Visit: Payer: Self-pay | Admitting: *Deleted

## 2012-02-11 MED ORDER — AMLODIPINE BESYLATE 10 MG PO TABS: 10.0000 mg | ORAL_TABLET | Freq: Every day | ORAL | Status: DC

## 2012-02-11 NOTE — Telephone Encounter (Signed)
I gave pt prescription for this yesterday, i just did fax via epic now, if that does not work, can we please call it in?

## 2012-02-11 NOTE — Telephone Encounter (Signed)
Rx called in to pharmacy. 

## 2012-04-13 ENCOUNTER — Other Ambulatory Visit: Payer: Self-pay | Admitting: Internal Medicine

## 2012-04-13 NOTE — Telephone Encounter (Signed)
Isn't he coming in today? Can this refill be filled during his visit if deemed appropriate?

## 2012-04-13 NOTE — Telephone Encounter (Addendum)
No scheduled appointment

## 2012-04-19 ENCOUNTER — Other Ambulatory Visit: Payer: Self-pay | Admitting: *Deleted

## 2012-04-20 MED ORDER — ATENOLOL 100 MG PO TABS: 100.0000 mg | ORAL_TABLET | Freq: Every day | ORAL | Status: DC

## 2012-04-29 ENCOUNTER — Encounter: Payer: Medicaid Other | Admitting: Internal Medicine

## 2012-05-17 ENCOUNTER — Other Ambulatory Visit: Payer: Self-pay | Admitting: *Deleted

## 2012-05-17 DIAGNOSIS — M1990 Unspecified osteoarthritis, unspecified site: Secondary | ICD-10-CM

## 2012-05-17 MED ORDER — QUINAPRIL HCL 20 MG PO TABS: 20.0000 mg | ORAL_TABLET | Freq: Two times a day (BID) | ORAL | Status: DC

## 2012-05-17 MED ORDER — TRAMADOL HCL 50 MG PO TABS: ORAL_TABLET | ORAL | Status: DC

## 2012-05-17 NOTE — Telephone Encounter (Signed)
Discuss change in direction on med till appt time - 05/25/12 per Dr Ellwood Dense. Hilda Blades Airelle Everding RN 05/17/12 1:50PM

## 2012-05-17 NOTE — Telephone Encounter (Signed)
Rx called in to pharmacy and also left note -  needs appt.

## 2012-05-17 NOTE — Telephone Encounter (Signed)
Kidney function moderate to severe decline. GFR <60.  On Quinapril 40 mg in the morning and 20 mg in the evening - for hypertension. I have decreased this dose to 20 mg twice daily. His last three blood pressures have been: BP Readings from Last 3 Encounters:  02/10/12 142/83  01/16/12 151/80  11/04/11 127/79   He will need readjustment of his medications for optimal blood pressure given this decrease in Quinapril.   Called him and left a message on his voice mail to call clinic back and make an appointment soon. Cut down on refills.

## 2012-05-25 ENCOUNTER — Ambulatory Visit (INDEPENDENT_AMBULATORY_CARE_PROVIDER_SITE_OTHER): Payer: Medicaid Other | Admitting: Internal Medicine

## 2012-05-25 ENCOUNTER — Encounter: Payer: Self-pay | Admitting: Internal Medicine

## 2012-05-25 VITALS — BP 143/78 | HR 63 | Temp 97.3°F | Ht 72.0 in | Wt 219.2 lb

## 2012-05-25 DIAGNOSIS — Z114 Encounter for screening for human immunodeficiency virus [HIV]: Secondary | ICD-10-CM

## 2012-05-25 DIAGNOSIS — B1710 Acute hepatitis C without hepatic coma: Secondary | ICD-10-CM

## 2012-05-25 DIAGNOSIS — N189 Chronic kidney disease, unspecified: Secondary | ICD-10-CM

## 2012-05-25 DIAGNOSIS — B1920 Unspecified viral hepatitis C without hepatic coma: Secondary | ICD-10-CM

## 2012-05-25 DIAGNOSIS — I129 Hypertensive chronic kidney disease with stage 1 through stage 4 chronic kidney disease, or unspecified chronic kidney disease: Secondary | ICD-10-CM

## 2012-05-25 DIAGNOSIS — I10 Essential (primary) hypertension: Secondary | ICD-10-CM

## 2012-05-25 DIAGNOSIS — N259 Disorder resulting from impaired renal tubular function, unspecified: Secondary | ICD-10-CM

## 2012-05-25 DIAGNOSIS — M1990 Unspecified osteoarthritis, unspecified site: Secondary | ICD-10-CM

## 2012-05-25 MED ORDER — AMLODIPINE BESYLATE 10 MG PO TABS
10.0000 mg | ORAL_TABLET | Freq: Every day | ORAL | Status: DC
Start: 2012-05-25 — End: 2012-06-10

## 2012-05-25 MED ORDER — ESOMEPRAZOLE MAGNESIUM 40 MG PO CPDR: 40.0000 mg | DELAYED_RELEASE_CAPSULE | Freq: Two times a day (BID) | ORAL | Status: DC

## 2012-05-25 MED ORDER — CLONIDINE HCL 0.1 MG PO TABS: 0.1000 mg | ORAL_TABLET | Freq: Two times a day (BID) | ORAL | Status: DC

## 2012-05-25 MED ORDER — HYDROCHLOROTHIAZIDE 25 MG PO TABS: 25.0000 mg | ORAL_TABLET | Freq: Every day | ORAL | Status: DC

## 2012-05-25 MED ORDER — TRAMADOL HCL 50 MG PO TABS: ORAL_TABLET | ORAL | Status: DC

## 2012-05-25 MED ORDER — ATENOLOL 100 MG PO TABS: 100.0000 mg | ORAL_TABLET | Freq: Every day | ORAL | Status: DC

## 2012-05-25 NOTE — Patient Instructions (Signed)
Please follow up with hepatitis clinic--we will refer you today  I will call you with results of your blood tests and urine test today  Please continue to do your stretches and exercise as tolerated  Chronic Renal Insufficiency Chronic renal insufficiency (also called kidney failure) occurs when there is kidney damage done. The damage prevents the kidneys from working like they should.  The kidneys do many important things. They:  Filter waste out of the blood.  Regulate the amount of water and various salts in the blood stream.  Produce chemicals that:  Prompt the bone marrow to make red blood cells.  Regulate blood pressure.  Keep calcium in balance throughout the bones and the body. When the kidneys are damaged, they can no longer filter waste products out of the blood. These substances build up in the blood, causing illness.  CAUSES   Diabetes.  High blood pressure.  Glomerular diseases: Conditions that damage the tiny blood vessels (glomeruli) within the kidneys, such as:  Membranous nephropathy.  IgA nephropathy.  Focal segmental glomerulosclerosis.  Poisons (such as overdoses or misuse of acetaminophen or NSAIDS, or exposure to other toxic substances).  Kidney injuries.  Kidney cancer or cancer that spreads to the kidney.  Medications such as NSAIDs. These problems are rare.  Kidney stones.  Alport disease.  Polycystic kidneys. SYMPTOMS  Most people do not notice symptoms of kidney failure until their kidney function drops below about 30-40% of normal. Symptoms can include:  Weakness.  Tiredness.  Frequent urination.  Intense need to urinate.  Excess bruising.  Low urine production.  Blood in the urine.  Pain in the kidney area.  Feeling sick to your stomach (nausea).  Vomiting.  Unusual bleeding.  Numbness in hands and feet.  Swelling in legs, arms and face.  Confusion. DIAGNOSIS  Your caregiver will look for signs of kidney  failure. Tests to diagnose kidney failure may include:  Urine tests: May reveal the presence of blood, protein or sugar.  Blood tests: May show low red blood cell count (anemia) or high levels of waste products (BUN and creatinine) that are normally filtered out of the bloodstream by the kidneys.  Imaging tests  These are tests that create pictures of the organs inside the abdomen, such as the kidneys. They may reveal masses growing in the kidneys or blockages to the flow of urine. Possible imaging tests may include:  Ultrasound.  CT scan.  MRI.  Intravenous pyelogram or IVP. This is a test that involves injecting dye into the bloodstream and then taking a series of x-rays of the kidneys. This allows the kidneys and other parts of the urinary system to be viewed more clearly.  Kidney biopsy  A small sample of kidney is removed using a special needle. The sample is examined for abnormalities under a microscope. TREATMENT  Chronic kidney failure cannot usually be cured. The various symptoms are treated, and measures are taken to avoid further kidney damage. Treatment for mild to moderate kidney failure may include:  Medication for high blood pressure.  Good control of diabetes.  Medication and diet change to improve anemia.  A low-sodium, low-potassium, low-protein and/or low-cholesterol diet.  Limiting the quantity of liquids in the diet. Treatment for more severe kidney failure may require:  Dialysis  Mechanical methods of filtering the blood.  Kidney transplant  An operation that removes the diseased kidney and replaces it with a donated kidney. HOME CARE INSTRUCTIONS   Take medication as told by your caregiver.  Quit smoking if you are a smoker. Talk to your caregiver about a smoking cessation program.  Follow your prescribed diet.  If you are prescribed vitamins, take them as told. SEEK IMMEDIATE MEDICAL CARE IF:  You start to produce less urine.  You notice blood  in your urine.  You have increased pain.  You have increased weakness, fatigue or confusion.  You notice new swelling.  You develop a fever.  You feel that you are having side effects of medicines prescribed. Document Released: 11/20/2007 Document Revised: 05/05/2011 Document Reviewed: 03/04/2010 [facility] Patient Information 2013 [place].

## 2012-05-25 NOTE — Assessment & Plan Note (Addendum)
BP Readings from Last 3 Encounters:  05/25/12 143/78  02/10/12 142/83  01/16/12 151/80   Lab Results  Component Value Date   NA 136 11/04/2011   K 3.5 11/04/2011   CREATININE 1.45* 11/04/2011    Assessment:  Blood pressure control: controlled  Progress toward BP goal:  at goal  Comments: quinapril recently decreased to 20mg  BID.    Plan:  Medications:  continue current medications on norvasc 10mg , atenolol 100mg , and clonidine 0.1mg  bid, hctz 25mg  qd, and quinapril 20mg  bid  Educational resources provided: brochure;video  Self management tools provided:

## 2012-05-25 NOTE — Progress Notes (Signed)
Subjective:   Patient ID: Edward Morgan male   DOB: 11-02-1950 62 y.o.   MRN: [id number]  HPI: Edward Morgan is a 62 y.o. pleasant African American male with PMH of untreated hepatitis c, HTN, and CVA presenting to clinic today for routine follow up and medication refills.    HTN: relatively well controlled.  Quinapril recently decreased to 20mg  BID from 40mg  in AM and 20mg  in PM.  Also on BB, CCB, ACEi, and thiazide  CKD stage 2-3, chronic since ~2009.  Normal renal ultrasound in 2009.  Mild proteinuria.  On accupril.    He has no other complaints at this time. He denies any fever, chills, N/V/D, chest pain shortness of breath, abdominal pain or any dysuria.  Due to his back pain and memory loss, he continues to have home health nurse and is requesting handicap sticker as well for assistance.  Past Medical History  Diagnosis Date  . Depression   . Hypertension   . 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  [name] GERD (gastroesophageal reflux disease)   . Arthritis   . H. pylori infection     history of  . Shortness of breath   . Stroke 01/27/2011   Current Outpatient Prescriptions  Medication Sig Dispense Refill  . amLODipine (NORVASC) 10 MG tablet Take 1 tablet (10 mg total) by mouth daily.  90 tablet  2  . aspirin EC 81 MG tablet Take 81 mg by mouth daily.      [name] atenolol (TENORMIN) 100 MG tablet Take 1 tablet (100 mg total) by mouth daily.  90 tablet  2  . cloNIDine (CATAPRES) 0.1 MG tablet Take 1 tablet (0.1 mg total) by mouth 2 (two) times daily.  60 tablet  2  . esomeprazole (NEXIUM) 40 MG capsule Take 1 capsule (40 mg total) by mouth 2 (two) times daily.  60 capsule  5  . hydrochlorothiazide (HYDRODIURIL) 25 MG tablet Take 1 tablet (25 mg total) by mouth daily.  90 tablet  3  . Multiple Vitamins-Minerals (MENS MULTI VITAMIN & MINERAL) TABS Take 1 tablet by  mouth daily.      . pravastatin (PRAVACHOL) 20 MG tablet Take 20 mg by mouth daily.      . quinapril (ACCUPRIL) 20 MG tablet Take 1 tablet (20 mg total) by mouth 2 (two) times daily.  60 tablet  0  . traMADol (ULTRAM) 50 MG tablet TAKE ONE TABLET BY MOUTH EVERY 8 HOURS AS NEEDED FOR PAIN  90 tablet  0   No current facility-administered medications for this visit.   Family History  Problem Relation Age of Onset  . Hypertension Mother   . Hypertension Father   . Heart attack Father 22  . Stroke Father     late 69s  . Diabetes Brother    History   Social History  . Marital Status: Married    Spouse Name: N/A    Number of Children: N/A  . Years of Education: N/A   Social History Main Topics  . Smoking status: Never Smoker   . Smokeless tobacco: Never Used  . Alcohol Use: No  . 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  [name] Sexually Active: No   Other Topics Concern  . None   Social History Narrative   Financial assistance approved  for 100% discount at [facility] and has [facility] card   [facility]  March 12, 2010 5:55 PM   Divorced and single currently.   Was in prison in 62's.         Review of Systems: Constitutional: Denies fever, chills, diaphoresis, appetite change and fatigue.  HEENT: Denies photophobia, eye pain, redness, hearing loss, ear pain, congestion, sore throat, rhinorrhea, sneezing, mouth sores, trouble swallowing, neck pain, neck stiffness and tinnitus.   Respiratory: Denies SOB, DOE, cough, chest tightness,  and wheezing.   Cardiovascular: Denies chest pain, palpitations and leg swelling.  Gastrointestinal: Denies nausea, vomiting, abdominal pain, diarrhea, constipation, blood in stool and abdominal distention.  Genitourinary: Denies dysuria, urgency, frequency, hematuria, flank pain and difficulty urinating.  Musculoskeletal: chronic back pain.  Denies myalgias, back pain, joint swelling.  Skin: Denies pallor, rash and wound.  Neurological: Denies  dizziness, seizures, syncope, weakness, light-headedness, numbness and headaches.  Hematological: Denies adenopathy. Easy bruising, personal or family bleeding history  Psychiatric/Behavioral: Denies suicidal ideation, mood changes, confusion, nervousness, sleep disturbance and agitation  Objective:  Physical Exam: Filed Vitals:   05/25/12 1312  BP: 143/78  Pulse: 63  Temp: 97.3 F (36.3 C)  TempSrc: Oral  Height: 6' (1.829 m)  Weight: 219 lb 3.2 oz (99.428 kg)  SpO2: 98%   Constitutional: Vital signs reviewed.  Patient is a well-developed and well-nourished male in no acute distress and cooperative with exam. Alert and oriented x3.  Head: Normocephalic and atraumatic Ear: TM normal bilaterally Mouth: no erythema or exudates, MMM Eyes: PERRL, EOMI, conjunctivae normal, No scleral icterus.  Neck: Supple, Trachea midline normal ROM Cardiovascular: RRR, S1 normal, S2 normal, no MRG, pulses symmetric and intact bilaterally Pulmonary/Chest: CTAB, no wheezes, rales, or rhonchi Abdominal: Soft. Non-tender, non-distended, bowel sounds are normal  GU: no CVA tenderness Musculoskeletal: No joint deformities, erythema,chronic back pain Neurological: A&O x3, Strength is normal and symmetric bilaterally, cranial nerve II-XII are grossly intact, no focal motor deficit, sensory intact to light touch bilaterally.  Skin: Warm, dry and intact. No rash, cyanosis, or clubbing.  Psychiatric: Normal mood and affect. speech and behavior is normal. Judgment and thought content normal. Cognition and memory are normal.   Assessment & Plan:  Discussed with Dr. Daryll Drown CKD: f/u spep upep Hep c: referred to hepatitis clinic  Handicap sticker temporary given today

## 2012-05-25 NOTE — Assessment & Plan Note (Addendum)
Chronic renal insufficiency with GFR 59 10/2011.  Stage II-III.  Benign sediment on u/a with 100 protein.  Microalbumin/Cr ratio: 654 in 2010.    No evidence of renal damage on ultrasound.  Cr holding relatively stable ~1.5range.    -will proceed with SPEP, UPEP evaluation at this time, r/o possible MGUS -if acutely worsens, may have some role for biopsy but will hold on that for now -on accupril -f/u 3 months, recheck bmet at that time

## 2012-05-25 NOTE — Assessment & Plan Note (Signed)
F/u spep upep -refilled tramadol

## 2012-05-25 NOTE — Assessment & Plan Note (Signed)
Referred to hepatitis clinic today

## 2012-05-27 LAB — PROTEIN ELECTROPHORESIS, URINE REFLEX
Albumin: 79.5 %
Beta Globulin, U: 3.7 %
Gamma Globulin, U: 4.4 %

## 2012-05-27 LAB — PROTEIN ELECTROPHORESIS, SERUM
Albumin ELP: 52.2 % — ABNORMAL LOW (ref 55.8–66.1)
Beta 2: 5.7 % (ref 3.2–6.5)
Beta Globulin: 5.8 % (ref 4.7–7.2)
Gamma Globulin: 21.3 % — ABNORMAL HIGH (ref 11.1–18.8)

## 2012-06-10 ENCOUNTER — Encounter (HOSPITAL_COMMUNITY): Payer: Self-pay | Admitting: Emergency Medicine

## 2012-06-10 ENCOUNTER — Emergency Department (HOSPITAL_COMMUNITY)
Admission: EM | Admit: 2012-06-10 | Discharge: 2012-06-10 | Disposition: A | Discharge status: Home / Self Care | Payer: No Typology Code available for payment source | Attending: Emergency Medicine | Admitting: Emergency Medicine

## 2012-06-10 DIAGNOSIS — Z79899 Other long term (current) drug therapy: Secondary | ICD-10-CM | POA: Insufficient documentation

## 2012-06-10 DIAGNOSIS — Y92410 Unspecified street and highway as the place of occurrence of the external cause: Secondary | ICD-10-CM | POA: Insufficient documentation

## 2012-06-10 DIAGNOSIS — K219 Gastro-esophageal reflux disease without esophagitis: Secondary | ICD-10-CM | POA: Insufficient documentation

## 2012-06-10 DIAGNOSIS — I10 Essential (primary) hypertension: Secondary | ICD-10-CM | POA: Insufficient documentation

## 2012-06-10 DIAGNOSIS — F329 Major depressive disorder, single episode, unspecified: Secondary | ICD-10-CM | POA: Insufficient documentation

## 2012-06-10 DIAGNOSIS — B1920 Unspecified viral hepatitis C without hepatic coma: Secondary | ICD-10-CM | POA: Insufficient documentation

## 2012-06-10 DIAGNOSIS — Z8673 Personal history of transient ischemic attack (TIA), and cerebral infarction without residual deficits: Secondary | ICD-10-CM | POA: Insufficient documentation

## 2012-06-10 DIAGNOSIS — F3289 Other specified depressive episodes: Secondary | ICD-10-CM | POA: Insufficient documentation

## 2012-06-10 DIAGNOSIS — S139XXA Sprain of joints and ligaments of unspecified parts of neck, initial encounter: Secondary | ICD-10-CM | POA: Insufficient documentation

## 2012-06-10 DIAGNOSIS — S161XXA Strain of muscle, fascia and tendon at neck level, initial encounter: Secondary | ICD-10-CM

## 2012-06-10 DIAGNOSIS — M129 Arthropathy, unspecified: Secondary | ICD-10-CM | POA: Insufficient documentation

## 2012-06-10 DIAGNOSIS — Y9389 Activity, other specified: Secondary | ICD-10-CM | POA: Insufficient documentation

## 2012-06-10 DIAGNOSIS — Z7982 Long term (current) use of aspirin: Secondary | ICD-10-CM | POA: Insufficient documentation

## 2012-06-10 MED ORDER — CYCLOBENZAPRINE HCL 10 MG PO TABS: 10.0000 mg | ORAL_TABLET | Freq: Two times a day (BID) | ORAL | Status: DC | PRN

## 2012-06-10 MED ORDER — HYDROCODONE-ACETAMINOPHEN 5-325 MG PO TABS: 1.0000 | ORAL_TABLET | Freq: Four times a day (QID) | ORAL | Status: DC | PRN

## 2012-06-10 MED ORDER — HYDROCODONE-ACETAMINOPHEN 5-325 MG PO TABS
1.0000 | ORAL_TABLET | Freq: Once | ORAL | Status: AC
  Administered 2012-06-10: 1 via ORAL
  Filled 2012-06-10: qty 1

## 2012-06-10 NOTE — ED Provider Notes (Signed)
Medical screening examination/treatment/procedure(s) were performed by non-physician practitioner and as supervising physician I was immediately available for consultation/collaboration.   Blanchie Dessert, MD 06/10/12 1451
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
## 2012-07-13 ENCOUNTER — Other Ambulatory Visit: Payer: Self-pay | Admitting: Internal Medicine

## 2012-08-03 ENCOUNTER — Ambulatory Visit: Payer: Medicaid Other | Attending: Orthopedic Surgery | Admitting: Physical Therapy

## 2012-08-03 DIAGNOSIS — M545 Low back pain, unspecified: Secondary | ICD-10-CM | POA: Insufficient documentation

## 2012-08-03 DIAGNOSIS — M2550 Pain in unspecified joint: Secondary | ICD-10-CM | POA: Insufficient documentation

## 2012-08-03 DIAGNOSIS — IMO0001 Reserved for inherently not codable concepts without codable children: Secondary | ICD-10-CM | POA: Insufficient documentation

## 2012-08-03 DIAGNOSIS — M2560 Stiffness of unspecified joint, not elsewhere classified: Secondary | ICD-10-CM | POA: Insufficient documentation

## 2012-08-09 ENCOUNTER — Other Ambulatory Visit: Payer: Self-pay | Admitting: Internal Medicine

## 2012-08-16 ENCOUNTER — Ambulatory Visit: Payer: Medicaid Other | Admitting: Rehabilitation

## 2012-08-20 ENCOUNTER — Ambulatory Visit: Payer: Medicaid Other | Admitting: Rehabilitation

## 2012-08-23 ENCOUNTER — Ambulatory Visit: Payer: Medicaid Other | Admitting: Physical Therapy

## 2012-08-25 ENCOUNTER — Ambulatory Visit: Payer: No Typology Code available for payment source | Attending: Orthopedic Surgery | Admitting: Physical Therapy

## 2012-08-25 DIAGNOSIS — M545 Low back pain, unspecified: Secondary | ICD-10-CM | POA: Insufficient documentation

## 2012-08-25 DIAGNOSIS — M2560 Stiffness of unspecified joint, not elsewhere classified: Secondary | ICD-10-CM | POA: Insufficient documentation

## 2012-08-25 DIAGNOSIS — IMO0001 Reserved for inherently not codable concepts without codable children: Secondary | ICD-10-CM | POA: Insufficient documentation

## 2012-08-25 DIAGNOSIS — M2550 Pain in unspecified joint: Secondary | ICD-10-CM | POA: Insufficient documentation

## 2012-08-30 ENCOUNTER — Encounter: Payer: Medicaid Other | Admitting: Physical Therapy

## 2012-09-01 ENCOUNTER — Encounter: Payer: Medicaid Other | Admitting: Rehabilitation

## 2012-09-03 ENCOUNTER — Encounter: Payer: Medicaid Other | Admitting: Rehabilitation

## 2012-09-07 ENCOUNTER — Other Ambulatory Visit: Payer: Self-pay | Admitting: Internal Medicine

## 2012-09-14 ENCOUNTER — Ambulatory Visit (INDEPENDENT_AMBULATORY_CARE_PROVIDER_SITE_OTHER): Payer: Medicaid Other | Admitting: Internal Medicine

## 2012-09-14 ENCOUNTER — Other Ambulatory Visit: Payer: Self-pay | Admitting: *Deleted

## 2012-09-14 ENCOUNTER — Encounter: Payer: Self-pay | Admitting: Internal Medicine

## 2012-09-14 VITALS — BP 134/75 | HR 50 | Temp 97.7°F | Ht 72.0 in | Wt 211.8 lb

## 2012-09-14 DIAGNOSIS — R001 Bradycardia, unspecified: Secondary | ICD-10-CM | POA: Insufficient documentation

## 2012-09-14 DIAGNOSIS — I10 Essential (primary) hypertension: Secondary | ICD-10-CM

## 2012-09-14 DIAGNOSIS — I498 Other specified cardiac arrhythmias: Secondary | ICD-10-CM

## 2012-09-14 MED ORDER — HYDROCHLOROTHIAZIDE 25 MG PO TABS: 25.0000 mg | ORAL_TABLET | Freq: Every day | ORAL | Status: DC

## 2012-09-14 MED ORDER — QUINAPRIL HCL 20 MG PO TABS: 20.0000 mg | ORAL_TABLET | Freq: Two times a day (BID) | ORAL | Status: DC

## 2012-09-14 MED ORDER — ESOMEPRAZOLE MAGNESIUM 40 MG PO CPDR: 40.0000 mg | DELAYED_RELEASE_CAPSULE | Freq: Every day | ORAL | Status: DC

## 2012-09-14 MED ORDER — ATENOLOL 50 MG PO TABS: 50.0000 mg | ORAL_TABLET | Freq: Every day | ORAL | Status: DC

## 2012-09-14 MED ORDER — CLONIDINE HCL 0.1 MG PO TABS: 0.1000 mg | ORAL_TABLET | Freq: Two times a day (BID) | ORAL | Status: DC

## 2012-09-14 MED ORDER — AMLODIPINE BESYLATE 10 MG PO TABS: 10.0000 mg | ORAL_TABLET | Freq: Every day | ORAL | Status: DC

## 2012-09-14 MED ORDER — PRAVASTATIN SODIUM 20 MG PO TABS: ORAL_TABLET | ORAL | Status: DC

## 2012-09-14 MED ORDER — TRAMADOL HCL 50 MG PO TABS: 50.0000 mg | ORAL_TABLET | Freq: Three times a day (TID) | ORAL | Status: DC | PRN

## 2012-09-14 NOTE — Assessment & Plan Note (Signed)
HR in 50s today.  Asymptomatic.    -decrease atenolol to 50mg  and monitor closely on follow up -cautioned on signs and symptoms of hypotension and to return to opc or ED if symptoms persist -monitor hr and bp at home

## 2012-09-14 NOTE — Telephone Encounter (Signed)
Rx faxed in.

## 2012-09-14 NOTE — Assessment & Plan Note (Signed)
BP Readings from Last 3 Encounters:  09/14/12 134/75  06/10/12 132/73  05/25/12 143/78   Lab Results  Component Value Date   NA 136 11/04/2011   K 3.5 11/04/2011   CREATININE 1.45* 11/04/2011   Assessment: Blood pressure control: controlled Progress toward BP goal:  at goal  Plan: Medications:  continue current medications but decreased atenolol to 50mg  due to bradycardia.  Continue norvasc, clonidine, hctz, and quinapril Educational resources provided: brochure

## 2012-09-14 NOTE — Progress Notes (Signed)
Subjective:   Patient ID: Edward Morgan male   DOB: 1951/02/01 62 y.o.   MRN: [id number]  HPI: Edward Morgan is a 62 y.o. african [place] male with PMH of HTN, Hepatitis C, CVA, and depression presenting to clinic today for medication refills.  He would like 3 month refills on his medications at one time.  He has no major complaints at this time but claims he tried to apply icy hot to his lower back and knees the other day and has been feeling a hot and burning sensation in the area where the cream was applied and slight itching.  He denies any rash or vesicles.  He says its not painful and is getting better.    Of note, his HR was in 109s in opc today and he appears asymptomatic.  He denies any chest pain, SOB, dizziness, near syncope, headaches, change in vision, or diaphoresis.  His back pain is well controlled with tramadol and he does not wish to pursue physical therapy or any further referral at this time.     Past Medical History  Diagnosis Date  . Depression   . Hypertension   . 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  [name] GERD (gastroesophageal reflux disease)   . Arthritis   . H. pylori infection     history of  . Shortness of breath   . Stroke 01/27/2011   Current Outpatient Prescriptions  Medication Sig Dispense Refill  . amLODipine (NORVASC) 10 MG tablet Take 1 tablet (10 mg total) by mouth at bedtime.  90 tablet  5  . atenolol (TENORMIN) 50 MG tablet Take 1 tablet (50 mg total) by mouth daily.  30 tablet  0  . cloNIDine (CATAPRES) 0.1 MG tablet Take 1 tablet (0.1 mg total) by mouth 2 (two) times daily.  180 tablet  3  . esomeprazole (NEXIUM) 40 MG capsule Take 1 capsule (40 mg total) by mouth daily.  90 capsule  5  . hydrochlorothiazide (HYDRODIURIL) 25 MG tablet Take 1 tablet (25 mg total) by mouth daily.  90 tablet  3  . Multiple Vitamins-Minerals  (MENS MULTI VITAMIN & MINERAL) TABS Take 1 tablet by mouth daily.      . pravastatin (PRAVACHOL) 20 MG tablet TAKE ONE TABLET BY MOUTH EVERY DAY  90 tablet  5  . quinapril (ACCUPRIL) 20 MG tablet Take 1 tablet (20 mg total) by mouth 2 (two) times daily.  180 tablet  5  . [START ON 09/22/2012] traMADol (ULTRAM) 50 MG tablet Take 1 tablet (50 mg total) by mouth every 8 (eight) hours as needed for pain.  90 tablet  3   No current facility-administered medications for this visit.   Family History  Problem Relation Age of Onset  . Hypertension Mother   . Hypertension Father   . Heart attack Father 72  . Stroke Father     late 76s  . Diabetes Brother    History   Social History  . Marital Status: Married    Spouse Name: N/A    Number of Children: N/A  . Years of Education: N/A   Social History Main Topics  . Smoking status: Never Smoker   . Smokeless tobacco: Never Used  . Alcohol Use: No  . 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  [name]  Sexually Active: None   Other Topics Concern  . None   Social History Narrative   Financial assistance approved for 100% discount at [facility] and has [facility] card   [employer]  March 12, 2010 5:55 PM   Divorced and single currently.   Was in prison in 70's.         Review of Systems:  Constitutional:  Denies fever, chills, diaphoresis, appetite change and fatigue.   HEENT:  Denies congestion, sore throat, rhinorrhea, sneezing, mouth sores, trouble swallowing, neck pain   Respiratory:  Denies SOB, DOE, cough, and wheezing.   Cardiovascular:  Denies chest pain, palpitations, and leg swelling.   Gastrointestinal:  Denies nausea, vomiting, abdominal pain, diarrhea, constipation, blood in stool and abdominal distention.   Genitourinary:  Denies dysuria, urgency, frequency, hematuria, flank pain and difficulty urinating.   Musculoskeletal:  Chronic back pain.  Denies myalgias, joint swelling, arthralgias and gait problem.   Skin:  Denies pallor,  rash and wound.   Neurological:  Denies dizziness, seizures, syncope, weakness, light-headedness, numbness and headaches.    Objective:  Physical Exam: Filed Vitals:   09/14/12 1458  BP: 134/75  Pulse: 50  Temp: 97.7 F (36.5 C)  TempSrc: Oral  Height: 6' (1.829 m)  Weight: 211 lb 12.8 oz (96.072 kg)  SpO2: 99%   Vitals reviewed. General: sitting on examination table, NAD HEENT: PERRL, EOMI Cardiac: bradycardia, no rubs, murmurs or gallops Pulm: clear to auscultation bilaterally, no wheezes, rales, or rhonchi Abd: soft, nontender, nondistended, BS present Ext: warm and well perfused, no pedal edema, +2DP B/L, +dry skin on back, no visible rash or blisters, no warmth to palpation of lower back Neuro: alert and oriented X3, cranial nerves II-XII grossly intact, strength 4/5 RLE compared to LLE and sensation to light touch equal in bilateral upper and lower extremities  Assessment & Plan:  Discussed with Dr. Lynnae January Hypotension: decrease atenolol to 50mg  qd and follow up in opc

## 2012-09-14 NOTE — Patient Instructions (Addendum)
General Instructions:  Please DECREASE your atenolol dose to 50mg  daily   Please check your BP and HR at home and if your start feeling dizzy, headaches, nausea, vomiting, shaking, sweating, feeling like you will faint, vision disturbances, chest pain, weakness or shortness of breath, call the clinic right away [phone]  Return to clinic in 2-3 weeks for recheck of your bp and HR  Treatment Goals:  Goals (1 Years of Data) as of 09/14/12         As of Today 06/10/12 05/25/12 02/10/12 02/10/12     Blood Pressure    . Blood Pressure < 140/90  134/75 132/73 143/78 142/83 140/79       Note created  02/10/2012  5:31 PM by Jerene Pitch, MD   BP today 140/79--claims he was a little anxious coming to Dr. Gabriel Carina and when in pain             Progress Toward Treatment Goals:  Treatment Goal 09/14/2012  Blood pressure at goal    Self Care Goals & Plans:  Self Care Goal 09/14/2012  Manage my medications take my medicines as prescribed; bring my medications to every visit  Monitor my health -  Eat healthy foods -  Be physically active -    Care Management & Community Referrals:  Referral 05/25/2012  Referrals made for care management support other (see comment)     Hypotension As your heart beats, it forces blood through your body. This force is called blood pressure. If you have hypotension, you have low blood pressure. When your blood pressure is too low, you may not get enough blood to your brain, feel weak, lightheaded, have a fast heartbeat, or even pass out (faint). HOME CARE  Drink enough fluids to keep your pee (urine) clear or pale yellow.  Take all medicines as told by your doctor.  Get up slowly after sitting or lying down.  Wear support stockings as told by your doctor.  Use walkers or canes as told by your doctor.  Talk with your doctor about having a home safety check (evaluation). GET HELP RIGHT AWAY IF:   You pass out (faint). Do not drive yourself. Call 911  if no other help is available.  You have chest pain, feel sick to your stomach (nauseous), or throw up (vomit).  You lose feeling in part of your body.  You cannot move your arms or legs.  You have trouble speaking.  You get sweaty or feel lightheaded. MAKE SURE YOU:   Understand these instructions.  Will watch your condition.  Will get help right away if you are not doing well or get worse. Document Released: 05/07/2009 Document Revised: 08/12/2011 Document Reviewed: 05/07/2009 [facility] Patient Information 2014 [place], [place].

## 2012-09-15 NOTE — Progress Notes (Signed)
Case discussed with Dr. Qureshi soon after the resident saw the patient.  We reviewed the resident's history and exam and pertinent patient test results.  I agree with the assessment, diagnosis, and plan of care documented in the resident's note. 

## 2012-09-17 ENCOUNTER — Telehealth: Payer: Self-pay | Admitting: *Deleted

## 2012-09-17 NOTE — Telephone Encounter (Signed)
[facility] - [facility] called about Nexium Rx 09/14/12 was written for one daily - past hx of taking med twice a day. Checked with Dr Eula Fried - pt to take one daily. Message left on pharmacy ID recording of answer. Hilda Blades Duvan Mousel RN 09/17/12 11:30AM

## 2012-10-05 ENCOUNTER — Ambulatory Visit (INDEPENDENT_AMBULATORY_CARE_PROVIDER_SITE_OTHER): Payer: Medicaid Other | Admitting: Internal Medicine

## 2012-10-05 ENCOUNTER — Telehealth: Payer: Self-pay | Admitting: Internal Medicine

## 2012-10-05 ENCOUNTER — Encounter: Payer: Self-pay | Admitting: Internal Medicine

## 2012-10-05 VITALS — BP 137/79 | HR 59 | Temp 97.9°F | Resp 20 | Ht 71.5 in | Wt 207.1 lb

## 2012-10-05 DIAGNOSIS — R001 Bradycardia, unspecified: Secondary | ICD-10-CM

## 2012-10-05 DIAGNOSIS — F528 Other sexual dysfunction not due to a substance or known physiological condition: Secondary | ICD-10-CM

## 2012-10-05 DIAGNOSIS — I498 Other specified cardiac arrhythmias: Secondary | ICD-10-CM

## 2012-10-05 DIAGNOSIS — I10 Essential (primary) hypertension: Secondary | ICD-10-CM

## 2012-10-05 MED ORDER — TRAMADOL HCL 50 MG PO TABS: 50.0000 mg | ORAL_TABLET | Freq: Three times a day (TID) | ORAL | Status: DC | PRN

## 2012-10-05 NOTE — Assessment & Plan Note (Signed)
BP Readings from Last 3 Encounters:  10/05/12 137/79  09/14/12 134/75  06/10/12 132/73   Lab Results  Component Value Date   NA 136 11/04/2011   K 3.5 11/04/2011   CREATININE 1.45* 11/04/2011   Assessment: Blood pressure control: controlled Progress toward BP goal:  at goal  Plan: Medications:  continue current medications norvasc 10mg , atenolol 50mg  qd, clonidine 0.1mg  bid, hctz 25mg , quinapril 20mg  bid Educational resources provided: brochure Self management tools provided: home blood pressure logbook Other plans:

## 2012-10-05 NOTE — Progress Notes (Signed)
Subjective:   Patient ID: Edward Morgan male   DOB: [birth year] 62 y.o.   MRN: [id number]  HPI: Edward Morgan is a 62 y.o. african [place] male with PMH of HTN, depression, hepatitis c, GERD, and CVA presenting to [facility] today for routine follow up visit.  He was last seen by me in clinic on 09/14/12 and noted to have bradycardia with HR in 50s but asymptomatic.  Today he reports feeling fine and denies any chest pain, SOB, fever, chills, dizziness, near syncope, syncope, headaches, changes in vision, or diaphoresis.  He reports compliance with his atenolol 50mg  along with his other medications.  He also discussed having some issues with his sex life that he wishes to keep private.  He says since his stroke he has trouble at times having an erection but is eventually able to have one.  His frequency of having sex has decreased to maybe once in two months and he wishes it was more.  He is asking if maybe his medications could be contributing, although he says he thinks its mainly from the stroke.  He does endorse usually waking up with an erection.   Past Medical History  Diagnosis Date  . Depression   . Hypertension   . 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  [name] GERD (gastroesophageal reflux disease)   . Arthritis   . H. pylori infection     history of  . Shortness of breath   . Stroke 01/27/2011   Current Outpatient Prescriptions  Medication Sig Dispense Refill  . amLODipine (NORVASC) 10 MG tablet Take 1 tablet (10 mg total) by mouth at bedtime.  90 tablet  5  . atenolol (TENORMIN) 50 MG tablet Take 1 tablet (50 mg total) by mouth daily.  30 tablet  0  . cloNIDine (CATAPRES) 0.1 MG tablet Take 1 tablet (0.1 mg total) by mouth 2 (two) times daily.  180 tablet  3  . esomeprazole (NEXIUM) 40 MG capsule Take 1 capsule (40 mg total) by mouth daily.  90 capsule  5  .  hydrochlorothiazide (HYDRODIURIL) 25 MG tablet Take 1 tablet (25 mg total) by mouth daily.  90 tablet  3  . Multiple Vitamins-Minerals (MENS MULTI VITAMIN & MINERAL) TABS Take 1 tablet by mouth daily.      . pravastatin (PRAVACHOL) 20 MG tablet TAKE ONE TABLET BY MOUTH EVERY DAY  90 tablet  5  . quinapril (ACCUPRIL) 20 MG tablet Take 1 tablet (20 mg total) by mouth 2 (two) times daily.  180 tablet  5  . traMADol (ULTRAM) 50 MG tablet Take 1 tablet (50 mg total) by mouth every 8 (eight) hours as needed for pain.  90 tablet  3   No current facility-administered medications for this visit.   Family History  Problem Relation Age of Onset  . Hypertension Mother   . Hypertension Father   . Heart attack Father 34  . Stroke Father     late 55s  . Diabetes Brother    History   Social History  . Marital Status: Married    Spouse Name: N/A    Number of Children: N/A  . Years of Education: N/A   Social History Main Topics  . Smoking status: Never Smoker   . Smokeless tobacco: Never Used  . Alcohol Use: No  . 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  [name] Sexually Active: None   Other Topics Concern  . None   Social History Narrative   Financial assistance approved for 100% discount at [facility] and has [facility] card   [employer]  March 12, 2010 5:55 PM   Divorced and single currently.   Was in prison in 70's.         Review of Systems:  Constitutional:  Denies fever, chills, diaphoresis, appetite change and fatigue.   HEENT:  Denies congestion, sore throat  Respiratory:  Denies SOB, DOE, cough, and wheezing.   Cardiovascular:  Denies chest pain, palpitations, and leg swelling.   Gastrointestinal:  Denies nausea, vomiting, abdominal pain, diarrhea, constipation, blood in stool and abdominal distention.   Genitourinary:  Denies dysuria, urgency, frequency, hematuria, flank pain and difficulty urinating. Erectile dysfunction occasionally.   Musculoskeletal:  Chronic back pain.   Denies myalgias, joint swelling, arthralgias and gait problem.   Skin:  Denies pallor, rash and wound.   Neurological:  Denies dizziness, seizures, syncope, weakness, light-headedness, numbness and headaches.    Objective:  Physical Exam: Filed Vitals:   10/05/12 1346  BP: 137/79  Pulse: 59  Temp: 97.9 F (36.6 C)  TempSrc: Oral  Resp: 20  Height: 5' 11.5" (1.816 m)  Weight: 207 lb 1.6 oz (93.94 kg)  SpO2: 98%   Vitals reviewed. General: sitting in chair, NAD HEENT: PERRL, EOMI, no scleral icterus Cardiac: RRR, no rubs, murmurs or gallops Pulm: clear to auscultation bilaterally, no wheezes, rales, or rhonchi Abd: soft, nontender, nondistended, BS present Ext: warm and well perfused, no pedal edema, +2DP B/L Neuro: alert and oriented X3, cranial nerves II-XII grossly intact, strength 4/5 RLE compared to LLE and sensation to light touch equal in bilateral upper and lower extremities  Assessment & Plan:  Discussed with Dr. Daryll Drown

## 2012-10-05 NOTE — Telephone Encounter (Signed)
I received a call from Edward Morgan this afternoon after her husband, Edward Morgan was seen in my office.  She informed me that she was concerned about his tramadol use and she says that she does not believe be his in that much pain and that he has a history of drug abuse and uses it to "get high".  She does not think he is abusing other drugs but she says he is likely abusing the tramadol and should not be on it anymore.  She says he lays in bed often after taking his medications and that he may get other medications from other people as well.  She has not voiced this concern to me before because she says he is always in the room and she didn't think it was her place but he was not with her at this time and so she wanted to call and let us know.    I thanked her for her call and informed her that we will monitor the situation and adjust medications accordingly.  At this time, we will cancel any pending refills and will re-evaluate on next office visit.  I called [facility] to cancel his future refills today as well.  Of note, Edward Morgan is [name]'s separated wife as they have told me in the past, however they are close and she is always the one who brings him to her appointments.    I discussed this conversation with Dr. Daryll Drown in [facility] today.

## 2012-10-05 NOTE — Patient Instructions (Addendum)
General Instructions: Your heart rate is better today.  Please continue to monitor it at home and if you notice chest pain, sob, fever, chills, dizziness, feeling like you will faint or you do faint, or sweating, call us right away or go to ED.   Continue your atenolol at 50mg  daily along with your other blood pressure medications  Treatment Goals:  Goals (1 Years of Data) as of 10/05/12         As of Today 09/14/12 06/10/12 05/25/12 02/10/12     Blood Pressure    . Blood Pressure < 140/90  137/79 134/75 132/73 143/78 142/83       Note created  02/10/2012  5:31 PM by Jerene Pitch, MD   BP today 140/79--claims he was a little anxious coming to Dr. Gabriel Carina and when in pain             Progress Toward Treatment Goals:  Treatment Goal 10/05/2012  Blood pressure at goal  Prevent falls at goal    Self Care Goals & Plans:  Self Care Goal 10/05/2012  Manage my medications take my medicines as prescribed; refill my medications on time; bring my medications to every visit  Monitor my health keep track of my blood pressure  Eat healthy foods eat foods that are low in salt; eat baked foods instead of fried foods; drink diet soda or water instead of juice or soda  Be physically active find an activity I enjoy; take a walk every day  Prevent falls use home fall prevention checklist to improve safety; wear appropriate shoes    Care Management & Community Referrals:  Referral 05/25/2012  Referrals made for care management support other (see comment)     Bradycardia Bradycardia is a term for a heart rate (pulse) that, in adults, is slower than 60 beats per minute. A normal rate is 60 to 100 beats per minute. A heart rate below 60 beats per minute may be normal for some adults with healthy hearts. If the rate is too slow, the heart may have trouble pumping the volume of blood the body needs. If the heart rate gets too low, blood flow to the brain may be decreased and may make you feel lightheaded,  dizzy, or faint. The heart has a natural pacemaker in the top of the heart called the SA node (sinoatrial or sinus node). This pacemaker sends out regular electrical signals to the muscle of the heart, telling the heart muscle when to beat (contract). The electrical signal travels from the upper parts of the heart (atria) through the AV node (atrioventricular node), to the lower chambers of the heart (ventricles). The ventricles squeeze, pumping the blood from your heart to your lungs and to the rest of your body. CAUSES   Problem with the heart's electrical system.  Problem with the heart's natural pacemaker.  Heart disease, damage, or infection.  Medications.  Problems with minerals and salts (electrolytes). SYMPTOMS   Fainting (syncope).  Fatigue and weakness.  Shortness of breath (dyspnea).  Chest pain (angina).  Drowsiness.  Confusion. DIAGNOSIS   An electrocardiogram (ECG) can help your caregiver determine the type of slow heart rate you have.  If the cause is not seen on an ECG, you may need to wear a heart monitor that records your heart rhythm for several hours or days.  Blood tests. TREATMENT   Electrolyte supplements.  Medications.  Withholding medication which is causing a slow heart rate.  Pacemaker placement. [place]  IF:   You feel lightheaded or faint.  You develop an irregular heart rate.  You feel chest pain or have trouble breathing. MAKE SURE YOU:   Understand these instructions.  Will watch your condition.  Will get help right away if you are not doing well or get worse. Document Released: 11/02/2001 Document Revised: 05/05/2011 Document Reviewed: 09/29/2007 [facility] Patient Information 2014 [place].

## 2012-10-05 NOTE — Assessment & Plan Note (Signed)
HR improved today to 69-61.  Will continue with atenolol 50mg .  May consider changing to coreg in future. Hx of grade 1 dd.

## 2012-10-05 NOTE — Assessment & Plan Note (Addendum)
Reports occasional trouble having an erection and lasting as long as he would like during sex.  Says he has these symptoms since his stroke.  He wonders if any of his medications could be contributing.  He reports usually having erections in the morning and is still able to have sex and ejaculate, usually once in 2 months but says he used to have increased frequency of sex on the past before his stroke.    -has been on Viagra in the past but was discontinued.  Last time noted to be in 2012.  -he wishes to just have his medications reviewed at this time and discuss further next time but he wants to keep the conversation as private as possible.

## 2012-10-08 NOTE — Progress Notes (Signed)
Case discussed with Dr. Qureshi at the time of the visit.  We reviewed the resident's history and exam and pertinent patient test results.  I agree with the assessment, diagnosis, and plan of care documented in the resident's note. 

## 2012-10-12 ENCOUNTER — Other Ambulatory Visit: Payer: Self-pay | Admitting: Internal Medicine

## 2012-10-15 ENCOUNTER — Other Ambulatory Visit: Payer: Self-pay | Admitting: *Deleted

## 2012-10-15 MED ORDER — ATENOLOL 50 MG PO TABS: 50.0000 mg | ORAL_TABLET | Freq: Every day | ORAL | Status: DC

## 2012-10-15 NOTE — Telephone Encounter (Signed)
Please call in his clonidine to the right place and i have refilled the atenolol.   In regards to his tramadol, please see my telephone not with his wife who says he is abusing this medication.  He needs to come to an appointment for further investigation, but he is not aware that his wife called.  This will also need to be discussed with attending.

## 2012-10-15 NOTE — Telephone Encounter (Signed)
i have called clonidine in to [facility]. Attempted to call pt 3 times but got a recording pt was not available

## 2012-10-18 ENCOUNTER — Encounter: Payer: Self-pay | Admitting: *Deleted

## 2012-10-18 NOTE — Progress Notes (Unsigned)
Pt walked into clinic asking why tramadol was not refilled. He was just seen in clinic on 8/12 and no mention of a problem. I read last note of Dr Eula Fried and she wants pt seen before refill of Tramadol.  Per note on 8/12 after office visit. Pt inform, Dr Eula Fried does not have any open slots for several weeks.   Scheduled with Dr. Alice Rieger for tomorrow.

## 2012-10-18 NOTE — Progress Notes (Signed)
I will see him tomorrow 

## 2012-10-19 ENCOUNTER — Encounter: Payer: Self-pay | Admitting: Internal Medicine

## 2012-10-19 ENCOUNTER — Ambulatory Visit (INDEPENDENT_AMBULATORY_CARE_PROVIDER_SITE_OTHER): Payer: Medicaid Other | Admitting: Internal Medicine

## 2012-10-19 VITALS — BP 134/77 | HR 66 | Temp 97.5°F | Ht 72.0 in | Wt 208.3 lb

## 2012-10-19 DIAGNOSIS — M1990 Unspecified osteoarthritis, unspecified site: Secondary | ICD-10-CM

## 2012-10-19 DIAGNOSIS — G8929 Other chronic pain: Secondary | ICD-10-CM

## 2012-10-19 DIAGNOSIS — M545 Low back pain, unspecified: Secondary | ICD-10-CM | POA: Insufficient documentation

## 2012-10-20 NOTE — Progress Notes (Signed)
Patient ID: Edward Morgan, male   DOB: [birth year], 62 y.o.   MRN: [id number]   Subjective:   HPI: [name] is a 62 y.o. with medical history of depression, hypertension, hepatitis C, osteoarthritis, and chronic low back pain, presents to the clinic requesting for refills of his tramadol.  Patient's tramadol refills were discontinued by his PCP on 10/05/2012 due to a concern of him abusing this medication. Patient's wife called his PCP and reported that she suspected that he also abuses  other drugs provided by his friends. His wife advised that he is not given any more Tramadol. On chart review, it appears that the he has been on Tramadol at least as far back as 07/2011 taking 50 mg q8hr prn. No pain contract in epic. Patient has was an IDU but denies current active drug abuse. A quick search in the [place] narcotic database reveals just 2 past prescriptions of Percocet possibly by ED.  Pt takes alcohol. Never a smoker. Patient has never been in pain clinic and he is stated that he would not be interested in going to pain clinic.   Patient reports chronic pain in his knees and elbows bilaterally, which have become debilitating since his tramadol was discontinued. The pain is 10/10 and is unable to sleep. He reports that his pain interrupts daily activities including watching TV and walking. He reports that he has tried ibuprofen and Tylenol, and other pain medications without relief. He has an upcoming appointment at the [facility] [place] on 10/27/2012. Imaging of his lumbar spine in 2011 by x-ray revealed diffuse degenerative disc disease with fusion at multiple levels. I can't find any imaging of his knees.  On 10/11/2012 Tramadol was elevated to schedule 4 level by the DEA due to its potential for being addictive, abused, or diverted.    Past Medical History  Diagnosis Date  . Depression   . Hypertension   . Hepatitis C     Patient has failed therapy in the past and has never been  treated for hepatitis C because of particular mutant  that he is suffering from.  [name] GERD (gastroesophageal reflux disease)   . Arthritis   . H. pylori infection     history of  . Shortness of breath   . Stroke 01/27/2011   Current Outpatient Prescriptions  Medication Sig Dispense Refill  . amLODipine (NORVASC) 10 MG tablet Take 1 tablet (10 mg total) by mouth at bedtime.  90 tablet  5  . atenolol (TENORMIN) 50 MG tablet Take 1 tablet (50 mg total) by mouth daily.  90 tablet  4  . cloNIDine (CATAPRES) 0.1 MG tablet Take 1 tablet (0.1 mg total) by mouth 2 (two) times daily.  180 tablet  3  . esomeprazole (NEXIUM) 40 MG capsule Take 1 capsule (40 mg total) by mouth daily.  90 capsule  5  . hydrochlorothiazide (HYDRODIURIL) 25 MG tablet Take 1 tablet (25 mg total) by mouth daily.  90 tablet  3  . Multiple Vitamins-Minerals (MENS MULTI VITAMIN & MINERAL) TABS Take 1 tablet by mouth daily.      . pravastatin (PRAVACHOL) 20 MG tablet TAKE ONE TABLET BY MOUTH EVERY DAY  90 tablet  5  . quinapril (ACCUPRIL) 20 MG tablet Take 1 tablet (20 mg total) by mouth 2 (two) times daily.  180 tablet  5  . traMADol (ULTRAM) 50 MG tablet Take 1 tablet (50 mg total) by mouth every 8 (eight) hours as needed for pain.  90 tablet  0   No current facility-administered medications for this visit.   Family History  Problem Relation Age of Onset  . Hypertension Mother   . Hypertension Father   . Heart attack Father 19  . Stroke Father     late 85s  . Diabetes Brother    History   Social History  . Marital Status: Married    Spouse Name: N/A    Number of Children: N/A  . Years of Education: N/A   Social History Main Topics  . Smoking status: Never Smoker   . Smokeless tobacco: Never Used  . Alcohol Use: No  . 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  [name] Sexual Activity: None   Other Topics Concern  . None   Social History Narrative   Financial assistance approved for 100% discount at [facility] and  has [facility] card   [employer]  March 12, 2010 5:55 PM   Divorced and single currently.   Was in prison in 6's.         Review of Systems: Constitutional: Denies fever, chills, diaphoresis, appetite change and fatigue.  Respiratory: Denies SOB, DOE, cough, chest tightness, and wheezing.  Cardiovascular: No chest pain, palpitations and leg swelling.  Gastrointestinal: No abdominal pain, nausea, vomiting, bloody stools Genitourinary: No dysuria, frequency, hematuria, or flank pain.   Objective:  Physical Exam: Filed Vitals:   10/19/12 0940  BP: 134/77  Pulse: 66  Temp: 97.5 F (36.4 C)  TempSrc: Oral  Height: 6' (1.829 m)  Weight: 208 lb 4.8 oz (94.484 kg)  SpO2: 99%   General: Appears irritable and was cooperative during my interview. Well nourished. No acute distress. Uses a cane.  Lungs: CTA bilaterally. Heart: RRR; no extra sounds or murmurs  Abdomen: Non-distended, normal BS, soft, nontender; no hepatosplenomegaly  Extremities: No pedal edema. No joint swelling or tenderness. I can not appreciate back tenderness.  Neurologic: Alert and oriented x3. No obvious neurologic deficits.  Assessment & Plan:  I have discussed my assessment and plan  with Dr. Ellwood Dense as detailed below.    We offered the patient 15 day supply of Tramadol to last him until he goes [facility] on 10/27/2012. I also broached the idea of a referral to the pain clinic, where his pain can be managed better. He revealed that he has never been to any pain clinic before. I explained to the patient about the recent restrictions on tramadol by the DEA. Patient verbalized understanding of these restrictions however, he declined the offer to the pain clinic. He gave no reasons for his refusal. He stated that he would be interested going to the [facility] on 9/3. In the end he politely refused the short term Tramadol prescription and he walked out of the examination room. He did not wait for his after that summary. He was  clearly frustrated and angry.   I will request the nurse to mail him the AVS in which I will send him information on chronic pain management. He is not a perferct candidate for chronic narcotic prescription in our clinic but I would give the benefit of doubt with serial UDS. I will defer further management of his chronic pain to his PCP, Dr. Eula Fried.

## 2012-10-20 NOTE — Patient Instructions (Signed)
  You have a painful condition that has required frequent use of Tramadol.  I regret that we were not able to help with your pain this time. We would like to see that you receive the best possible care for your problem.  To achieve this, Dr Eula Fried will discuss with you possible options which will include a referral to other pain specialists who can supervise a treatment plan for you. I encourage you to attend you appointment with the [facility] on 10/27/2012 Be sure to discuss with your doctor on your next visit.

## 2012-10-20 NOTE — Progress Notes (Signed)
AVS information mailed to pt per Dr Alice Rieger. Oceana Walthall RN 8.27.14 10AM

## 2012-10-21 NOTE — Progress Notes (Signed)
Case discussed with Dr.Kazibwe at the time of the visit.  We reviewed the resident's history and exam and pertinent patient test results.  I agree with the assessment, diagnosis, and plan of care documented in the resident's note.    

## 2012-10-27 ENCOUNTER — Ambulatory Visit: Payer: Medicaid Other | Admitting: Family Medicine

## 2012-10-27 ENCOUNTER — Encounter: Payer: Self-pay | Admitting: Family Medicine

## 2012-10-27 ENCOUNTER — Ambulatory Visit (INDEPENDENT_AMBULATORY_CARE_PROVIDER_SITE_OTHER): Payer: Medicaid Other | Admitting: Family Medicine

## 2012-10-27 VITALS — BP 136/77 | HR 60 | Temp 98.5°F | Ht 72.0 in | Wt 203.5 lb

## 2012-10-27 DIAGNOSIS — R7303 Prediabetes: Secondary | ICD-10-CM

## 2012-10-27 DIAGNOSIS — G894 Chronic pain syndrome: Secondary | ICD-10-CM

## 2012-10-27 DIAGNOSIS — I10 Essential (primary) hypertension: Secondary | ICD-10-CM

## 2012-10-27 DIAGNOSIS — Z23 Encounter for immunization: Secondary | ICD-10-CM

## 2012-10-27 DIAGNOSIS — R7309 Other abnormal glucose: Secondary | ICD-10-CM

## 2012-10-27 DIAGNOSIS — J309 Allergic rhinitis, unspecified: Secondary | ICD-10-CM

## 2012-10-27 DIAGNOSIS — N259 Disorder resulting from impaired renal tubular function, unspecified: Secondary | ICD-10-CM

## 2012-10-27 LAB — COMPREHENSIVE METABOLIC PANEL
ALT: 55 U/L — ABNORMAL HIGH (ref 0–53)
Albumin: 4.2 g/dL (ref 3.5–5.2)
CO2: 29 mEq/L (ref 19–32)
Chloride: 101 mEq/L (ref 96–112)
Potassium: 3.5 mEq/L (ref 3.5–5.3)
Sodium: 139 mEq/L (ref 135–145)
Total Bilirubin: 0.9 mg/dL (ref 0.3–1.2)
Total Protein: 7.7 g/dL (ref 6.0–8.3)

## 2012-10-27 LAB — GLUCOSE, CAPILLARY: Glucose-Capillary: 113 mg/dL — ABNORMAL HIGH (ref 70–99)

## 2012-10-27 MED ORDER — FLUTICASONE PROPIONATE 50 MCG/ACT NA SUSP: 2.0000 | Freq: Every day | NASAL | Status: DC

## 2012-10-27 MED ORDER — CELECOXIB 100 MG PO CAPS: 100.0000 mg | ORAL_CAPSULE | Freq: Two times a day (BID) | ORAL | Status: DC

## 2012-10-28 DIAGNOSIS — G894 Chronic pain syndrome: Secondary | ICD-10-CM | POA: Insufficient documentation

## 2012-10-28 NOTE — Progress Notes (Signed)
  Subjective:    Patient ID: Edward Morgan, male    DOB: 12-17-1950, 62 y.o.   MRN: [id number]  HPI New patient here to establish care. I take care of his wife and daughter and grandson. #1. Hypertension. He's been on medication for many years. Has not had any problems. Denies any history of MI. Does have prior history of left-sided stroke with some reportedly residual right-sided weakness. Denies smoking, illicit drugs. No supplements. #2. Chronic pain: He's been using tramadol in the past for chronic pain. Pain is diffuse multiple joints and muscles. #3. Complaining of stuffed up ears. Little bit of postnasal drip. No cough. No fever sweats or chills.   Review of Systems Denies chest pain, shortness of breath. No unusual weight loss or gain.    Objective:   Physical Exam Vital signs reviewed. GENERAL: Well-developed, well-nourished, no acute distress. CARDIOVASCULAR: Regular rate and rhythm no murmur gallop or rub LUNGS: Clear to auscultation bilaterally, no rales or wheeze. ABDOMEN: Soft positive bowel sounds NEURO: No gross focal neurological deficits. MSK: Movement of extremity x 4.        Assessment & Plan:

## 2012-10-28 NOTE — Assessment & Plan Note (Signed)
We'll try some fluticasone for his allergic rhinitis.

## 2012-10-28 NOTE — Assessment & Plan Note (Signed)
Reviewed blood work in the chart. We'll not make any medication changes right now. Followup 3 months.

## 2012-11-09 ENCOUNTER — Encounter: Payer: Self-pay | Admitting: Family Medicine

## 2012-11-16 ENCOUNTER — Encounter: Payer: Self-pay | Admitting: Internal Medicine

## 2012-11-16 ENCOUNTER — Encounter: Payer: Medicaid Other | Admitting: Internal Medicine

## 2012-12-06 ENCOUNTER — Other Ambulatory Visit: Payer: Self-pay | Admitting: Internal Medicine

## 2012-12-06 DIAGNOSIS — C220 Liver cell carcinoma: Secondary | ICD-10-CM

## 2013-01-11 ENCOUNTER — Other Ambulatory Visit: Payer: Self-pay | Admitting: *Deleted

## 2013-01-11 ENCOUNTER — Ambulatory Visit
Admission: RE | Admit: 2013-01-11 | Discharge: 2013-01-11 | Disposition: A | Discharge status: Home / Self Care | Payer: Medicaid Other | Source: Ambulatory Visit | Attending: Internal Medicine | Admitting: Internal Medicine

## 2013-01-11 DIAGNOSIS — C220 Liver cell carcinoma: Secondary | ICD-10-CM

## 2013-01-11 NOTE — Telephone Encounter (Signed)
No longer imc pt

## 2013-02-02 ENCOUNTER — Ambulatory Visit (INDEPENDENT_AMBULATORY_CARE_PROVIDER_SITE_OTHER): Payer: Medicaid Other | Admitting: Family Medicine

## 2013-02-02 VITALS — BP 160/85 | HR 62 | Temp 98.2°F | Ht 72.0 in | Wt 209.2 lb

## 2013-02-02 DIAGNOSIS — B1710 Acute hepatitis C without hepatic coma: Secondary | ICD-10-CM

## 2013-02-02 DIAGNOSIS — Z23 Encounter for immunization: Secondary | ICD-10-CM

## 2013-02-02 DIAGNOSIS — I10 Essential (primary) hypertension: Secondary | ICD-10-CM

## 2013-02-02 DIAGNOSIS — K219 Gastro-esophageal reflux disease without esophagitis: Secondary | ICD-10-CM

## 2013-02-02 DIAGNOSIS — N259 Disorder resulting from impaired renal tubular function, unspecified: Secondary | ICD-10-CM

## 2013-02-02 LAB — COMPREHENSIVE METABOLIC PANEL
AST: 54 U/L — ABNORMAL HIGH (ref 0–37)
Albumin: 4 g/dL (ref 3.5–5.2)
BUN: 23 mg/dL (ref 6–23)
CO2: 28 mEq/L (ref 19–32)
Calcium: 9.8 mg/dL (ref 8.4–10.5)
Chloride: 101 mEq/L (ref 96–112)
Glucose, Bld: 96 mg/dL (ref 70–99)
Potassium: 3.8 mEq/L (ref 3.5–5.3)
Sodium: 138 mEq/L (ref 135–145)

## 2013-02-02 MED ORDER — ATENOLOL 50 MG PO TABS: 50.0000 mg | ORAL_TABLET | Freq: Every day | ORAL | Status: DC

## 2013-02-02 MED ORDER — HYDROCHLOROTHIAZIDE 25 MG PO TABS: 25.0000 mg | ORAL_TABLET | Freq: Every day | ORAL | Status: DC

## 2013-02-02 MED ORDER — ESOMEPRAZOLE MAGNESIUM 40 MG PO CPDR: 40.0000 mg | DELAYED_RELEASE_CAPSULE | Freq: Every day | ORAL | Status: DC

## 2013-02-02 MED ORDER — AMLODIPINE BESYLATE 10 MG PO TABS: 10.0000 mg | ORAL_TABLET | Freq: Every day | ORAL | Status: DC

## 2013-02-02 MED ORDER — CLONIDINE HCL 0.1 MG PO TABS: 0.1000 mg | ORAL_TABLET | Freq: Two times a day (BID) | ORAL | Status: DC

## 2013-02-02 MED ORDER — PRAVASTATIN SODIUM 20 MG PO TABS: ORAL_TABLET | ORAL | Status: DC

## 2013-02-02 MED ORDER — QUINAPRIL HCL 20 MG PO TABS: 20.0000 mg | ORAL_TABLET | Freq: Two times a day (BID) | ORAL | Status: DC

## 2013-02-03 ENCOUNTER — Encounter: Payer: Self-pay | Admitting: Family Medicine

## 2013-02-08 NOTE — Progress Notes (Signed)
   Subjective:    Patient ID: Edward Morgan, male    DOB: 22-Dec-1950, 62 y.o.   MRN: [id number]  HPI Followup hypertension no problems with medicines. Need some refills. #2. Gastroesophageal reflux disease: His Medicaid insurance will not pay for once daily proton, temperature and he is having some mild breakthrough symptoms. He has some questions about this #3. Has seen the hepatologist and is scheduled to start some therapy for his hepatitis C. He has some questions about this as well.   Review of Systems No fever, sweats, chills, unusual weight change.    Objective:   Physical Exam  Vital signs reviewed. GENERAL: Well-developed, well-nourished, no acute distress. CARDIOVASCULAR: Regular rate and rhythm no murmur gallop or rub LUNGS: Clear to auscultation bilaterally, no rales or wheeze. ABDOMEN: Soft positive bowel sounds NEURO: No gross focal neurological deficits. MSK: Movement of extremity x 4.        Assessment & Plan:  #1 -hypertension with moderately good control. Refilled his medicines. We'll check some lab work #2. GERD: Talked about over-the-counter medications to use for breakthrough. #3. Hepatitis C. Discussed treatment with him to the extent of my knowledge base. I'll see him back in 3-6 months, sooner with problems.

## 2013-04-01 ENCOUNTER — Telehealth: Payer: Self-pay | Admitting: Family Medicine

## 2013-04-01 NOTE — Telephone Encounter (Signed)
Pt called and is having trouble with getting his BP medication. The new rules will not allow him to get his BP medication . Please call and discuss this with him. Please jw

## 2013-04-04 NOTE — Telephone Encounter (Signed)
Pt called again to day to see what the outcome is going to be. Medicaid will not pay for his BP medication or the generic of the same medication. He is out of his BP medication and doesn't know what he is going to do. Can we prescribed something else that he can take that is covered by medicaid. Please call and let him know what is going on. jw

## 2013-04-04 NOTE — Telephone Encounter (Signed)
Patient calls again, has not has meds in 5 days.

## 2013-04-05 MED ORDER — BENAZEPRIL HCL 40 MG PO TABS: 40.0000 mg | ORAL_TABLET | Freq: Every day | ORAL | Status: DC

## 2013-04-05 NOTE — Telephone Encounter (Signed)
Patient informed. Edward Morgan, Edward Morgan

## 2013-04-05 NOTE — Telephone Encounter (Signed)
Patient called back and being that he is having a hard time with Medicaid covering accupril, can we please call something else in, he is having really bad headaches due to him not having this medication.

## 2013-04-05 NOTE — Telephone Encounter (Signed)
Call patient's home and male stated that patient was not there and she did not know when she will see him again

## 2013-04-05 NOTE — Telephone Encounter (Signed)
Dear Dema Severin Team Can you call him and see what the issue is?  Iknow nothing of any new MEDICAID rules---is he talking about MEDICARE rules? Those have changed some and he may not be getting the kind of coverage he used to--I think they have a deduct able now (or the donut whole is at the front end rather than inn the middle of the donut). If that is the problem, please tell him how to get to MAP. THANKS! [name]

## 2013-04-05 NOTE — Telephone Encounter (Signed)
Dear Edward Morgan Team Please let him know we have changed to a formulary drug for the accupril--if there are any more probl;ems please let me know So sorry for the inconvenience

## 2013-06-15 ENCOUNTER — Ambulatory Visit (INDEPENDENT_AMBULATORY_CARE_PROVIDER_SITE_OTHER): Payer: Commercial Managed Care - HMO | Admitting: Family Medicine

## 2013-06-15 ENCOUNTER — Encounter: Payer: Self-pay | Admitting: Family Medicine

## 2013-06-15 ENCOUNTER — Telehealth: Payer: Self-pay | Admitting: Family Medicine

## 2013-06-15 VITALS — BP 141/66 | HR 68 | Temp 97.9°F | Ht 72.0 in | Wt 219.0 lb

## 2013-06-15 DIAGNOSIS — M216X9 Other acquired deformities of unspecified foot: Secondary | ICD-10-CM

## 2013-06-15 DIAGNOSIS — Z125 Encounter for screening for malignant neoplasm of prostate: Secondary | ICD-10-CM

## 2013-06-15 DIAGNOSIS — N259 Disorder resulting from impaired renal tubular function, unspecified: Secondary | ICD-10-CM

## 2013-06-15 DIAGNOSIS — B1710 Acute hepatitis C without hepatic coma: Secondary | ICD-10-CM

## 2013-06-15 DIAGNOSIS — I10 Essential (primary) hypertension: Secondary | ICD-10-CM

## 2013-06-15 DIAGNOSIS — R413 Other amnesia: Secondary | ICD-10-CM

## 2013-06-15 DIAGNOSIS — M21371 Foot drop, right foot: Secondary | ICD-10-CM

## 2013-06-15 LAB — COMPREHENSIVE METABOLIC PANEL
ALT: 17 U/L (ref 0–53)
AST: 21 U/L (ref 0–37)
Albumin: 3.8 g/dL (ref 3.5–5.2)
Alkaline Phosphatase: 58 U/L (ref 39–117)
BUN: 21 mg/dL (ref 6–23)
CALCIUM: 9.1 mg/dL (ref 8.4–10.5)
CHLORIDE: 101 meq/L (ref 96–112)
CO2: 27 meq/L (ref 19–32)
CREATININE: 1.42 mg/dL — AB (ref 0.50–1.35)
GLUCOSE: 127 mg/dL — AB (ref 70–99)
Potassium: 3.5 mEq/L (ref 3.5–5.3)
Sodium: 137 mEq/L (ref 135–145)
TOTAL PROTEIN: 7.2 g/dL (ref 6.0–8.3)
Total Bilirubin: 0.8 mg/dL (ref 0.2–1.2)

## 2013-06-15 LAB — LDL CHOLESTEROL, DIRECT: LDL DIRECT: 89 mg/dL

## 2013-06-15 MED ORDER — ATENOLOL 50 MG PO TABS: 50.0000 mg | ORAL_TABLET | Freq: Every day | ORAL | Status: DC

## 2013-06-15 MED ORDER — CLONIDINE HCL 0.1 MG PO TABS: 0.1000 mg | ORAL_TABLET | Freq: Two times a day (BID) | ORAL | Status: DC

## 2013-06-15 MED ORDER — AMLODIPINE BESYLATE 10 MG PO TABS: 10.0000 mg | ORAL_TABLET | Freq: Every day | ORAL | Status: DC

## 2013-06-15 MED ORDER — HYDROCHLOROTHIAZIDE 25 MG PO TABS: 25.0000 mg | ORAL_TABLET | Freq: Every day | ORAL | Status: DC

## 2013-06-15 MED ORDER — BENAZEPRIL HCL 40 MG PO TABS: 40.0000 mg | ORAL_TABLET | Freq: Every day | ORAL | Status: DC

## 2013-06-15 NOTE — Telephone Encounter (Signed)
Mr. Ginty received rx today for benazapril and doesn't know why he was given that since he has never had before.  Want provider to call him back to inquire and advise

## 2013-06-15 NOTE — Patient Instructions (Signed)
[place] office  ADDRESS:   PHONE: FAX: E-MAIL: PRACTITIONERS:    OFFICE HOURS :  [place], [place] [address] VIEW MAP [phone] [email or web address] [place],  8:30 AM - 5:00 PM [place] Office

## 2013-06-16 ENCOUNTER — Encounter: Payer: Self-pay | Admitting: Family Medicine

## 2013-06-16 DIAGNOSIS — M21371 Foot drop, right foot: Secondary | ICD-10-CM | POA: Insufficient documentation

## 2013-06-16 LAB — PSA, MEDICARE: PSA: 1.18 ng/mL (ref <=4.00)

## 2013-06-16 NOTE — Assessment & Plan Note (Signed)
After pretty long discussion with him I don't see anything that is true memory loss. He has some inattentiveness and some selective focusing on certain areas and I think this is what aggravates his family. I don't see any sign of early dementia. 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

## 2013-06-16 NOTE — Telephone Encounter (Signed)
Edward Morgan Did we not discuss this with him on way out? Please call and see if he has been taking this or not. If NOT,then he can throw rx away but let me know so I can take it off his chart. THANKS! [name]

## 2013-06-16 NOTE — Telephone Encounter (Signed)
Taken care of on 4/22

## 2013-06-16 NOTE — Assessment & Plan Note (Signed)
Refilled his medicines and will check basic metabolic profile today.

## 2013-06-16 NOTE — Assessment & Plan Note (Signed)
I want to check some lab work today. He was little unhappy about that because he doesn't want to get labs drawn more than once a year but we talked about his chronic renal insufficiency and the fact that really like to follow it at least twice a year so he finally agreed.

## 2013-06-16 NOTE — Progress Notes (Signed)
   Subjective:    Patient ID: Edward Morgan, male    DOB: 1950/07/06, 63 y.o.   MRN: [id number]  HPI Followup hypertension. Reports taking all of his medicine regularly but has some difficulty telling which medicines he does take. Denies chest pain. Denies shortness of breath. #2. Followup chronic hepatitis C. He has enrolled in a new treatment program. #3. His family has been concerned that his memory is not what it should be and he has a few questions. He's had no episodes of forgetting how to use household appliances, not getting lost, no confusion. Reports occasional forgetting conversations that he said with people and on one or 2 occasions forgetting what he went to the grocery store 4.   Review of Systems See history of present illness. Additionally denies depression, denies hallucinations, denies sleep problems. No agitation. No lower extremity edema or palpitations no abdominal pain    Objective:   Physical Exam  Vital signs reviewed. GENERAL: Well-developed, well-nourished, no acute distress. CARDIOVASCULAR: Regular rate and rhythm no murmur gallop or rub LUNGS: Clear to auscultation bilaterally, no rales or wheeze. ABDOMEN: Soft positive bowel sounds NEURO: No gross focal neurological deficits. MSK: Movement of extremity x 4.        Assessment & Plan:

## 2013-06-16 NOTE — Assessment & Plan Note (Signed)
Discuss his new hepatitis C treatment. He is not taking his cholesterol medicine during this time and had some questions about that. Reports he had some myalgias with it before and has only a minimally taken it since. Pathology discuss it with his hepatologist and potentially restarted for secondary prevention of stroke after his hepatitis C treatment.

## 2013-06-16 NOTE — Progress Notes (Signed)
Patient ID: Edward Morgan, male   DOB: 1950/05/29, 63 y.o.   MRN: [id number] He also talked about his right foot drop which has been present since his CVA several years ago. Bothers him most when he is fatigued. I gave him directions to the prosthetic office and a prescription for AFO.

## 2013-07-14 ENCOUNTER — Ambulatory Visit: Payer: Self-pay | Admitting: Nurse Practitioner

## 2013-08-17 ENCOUNTER — Other Ambulatory Visit: Payer: Self-pay | Admitting: Nurse Practitioner

## 2013-08-17 DIAGNOSIS — C220 Liver cell carcinoma: Secondary | ICD-10-CM

## 2013-08-31 ENCOUNTER — Ambulatory Visit
Admission: RE | Admit: 2013-08-31 | Discharge: 2013-08-31 | Disposition: A | Discharge status: Home / Self Care | Payer: Commercial Managed Care - HMO | Source: Ambulatory Visit | Attending: Nurse Practitioner | Admitting: Nurse Practitioner

## 2013-08-31 ENCOUNTER — Encounter (INDEPENDENT_AMBULATORY_CARE_PROVIDER_SITE_OTHER): Payer: Self-pay

## 2013-08-31 DIAGNOSIS — C220 Liver cell carcinoma: Secondary | ICD-10-CM

## 2013-11-30 ENCOUNTER — Ambulatory Visit (INDEPENDENT_AMBULATORY_CARE_PROVIDER_SITE_OTHER): Payer: Commercial Managed Care - HMO | Admitting: Family Medicine

## 2013-11-30 ENCOUNTER — Encounter: Payer: Self-pay | Admitting: Family Medicine

## 2013-11-30 VITALS — BP 127/63 | HR 61 | Temp 98.0°F | Ht 72.0 in | Wt 228.1 lb

## 2013-11-30 DIAGNOSIS — Z0000 Encounter for general adult medical examination without abnormal findings: Secondary | ICD-10-CM

## 2013-11-30 DIAGNOSIS — I10 Essential (primary) hypertension: Secondary | ICD-10-CM

## 2013-11-30 DIAGNOSIS — E785 Hyperlipidemia, unspecified: Secondary | ICD-10-CM

## 2013-11-30 DIAGNOSIS — B1710 Acute hepatitis C without hepatic coma: Secondary | ICD-10-CM

## 2013-11-30 DIAGNOSIS — Z23 Encounter for immunization: Secondary | ICD-10-CM

## 2013-11-30 LAB — COMPREHENSIVE METABOLIC PANEL
ALT: 18 U/L (ref 0–53)
AST: 25 U/L (ref 0–37)
Albumin: 4 g/dL (ref 3.5–5.2)
Alkaline Phosphatase: 57 U/L (ref 39–117)
BILIRUBIN TOTAL: 0.9 mg/dL (ref 0.2–1.2)
BUN: 24 mg/dL — ABNORMAL HIGH (ref 6–23)
CHLORIDE: 103 meq/L (ref 96–112)
CO2: 24 meq/L (ref 19–32)
Calcium: 9.5 mg/dL (ref 8.4–10.5)
Creat: 1.81 mg/dL — ABNORMAL HIGH (ref 0.50–1.35)
Glucose, Bld: 80 mg/dL (ref 70–99)
Potassium: 3.8 mEq/L (ref 3.5–5.3)
SODIUM: 138 meq/L (ref 135–145)
TOTAL PROTEIN: 7.1 g/dL (ref 6.0–8.3)

## 2013-11-30 NOTE — Patient Instructions (Signed)
I will send you a note about your lab work. I am checking your liver today. I will see you in 6 months to check your liver functions again.

## 2013-11-30 NOTE — Assessment & Plan Note (Signed)
Blood pressure is controlled well. We'll check creatinine and sugar today as long as we are checking for his liver functions anyway. The medication changes. Followup 6 months.

## 2013-11-30 NOTE — Progress Notes (Signed)
   Subjective:    Patient ID: Edward Morgan, male    DOB: 16-Nov-1950, 63 y.o.   MRN: [id number]  HPI Here fo check up on chronic issues No current problems Has one more tx for hep c--says his viralload was "zero".    Review of Systems  Constitutional: Negative for fever, chills, activity change, appetite change, fatigue and unexpected weight change.  HENT: Negative for ear pain and trouble swallowing.   Eyes: Negative for pain and visual disturbance.  Respiratory: Negative for cough, shortness of breath and wheezing.   Gastrointestinal: Negative for abdominal pain.  Genitourinary: Negative for dysuria.  Musculoskeletal: Negative for joint swelling and neck pain.  Skin: Negative for pallor.  Neurological: Negative for dizziness, weakness, light-headedness and headaches.  Psychiatric/Behavioral: Negative for confusion, sleep disturbance, dysphoric mood and decreased concentration.       Objective:   Physical Exam  Constitutional: He is oriented to person, place, and time. He appears well-developed and well-nourished.  HENT:  Head: Normocephalic and atraumatic.  Right Ear: External ear normal.  Left Ear: External ear normal.  Nose: Nose normal.  Mouth/Throat: Oropharynx is clear and moist.  Eyes: Conjunctivae and EOM are normal. Pupils are equal, round, and reactive to light. Left eye exhibits no discharge. No scleral icterus.  Neck: Normal range of motion. Neck supple. No thyromegaly present.  Cardiovascular: Normal rate, regular rhythm, normal heart sounds and intact distal pulses.   Pulmonary/Chest: Effort normal and breath sounds normal.  Abdominal: Soft. Bowel sounds are normal.  Musculoskeletal: Normal range of motion.  Lymphadenopathy:    He has no cervical adenopathy.  Neurological: He is alert and oriented to person, place, and time. He exhibits normal muscle tone.  Psychiatric: He has a normal mood and affect. His behavior is normal. Judgment and thought content  normal.          Assessment & Plan:

## 2013-11-30 NOTE — Assessment & Plan Note (Signed)
He reports he has one more treatment and that his last viral load was zero. His hepatologist want [place] to follow his liver function tests we'll check those today and see him back in 6 months

## 2013-11-30 NOTE — Assessment & Plan Note (Signed)
He continues on pravastatin without any issues. We'll check direct LDL at next office visit

## 2013-12-13 ENCOUNTER — Encounter: Payer: Self-pay | Admitting: Family Medicine

## 2014-01-06 ENCOUNTER — Other Ambulatory Visit: Payer: Self-pay | Admitting: Family Medicine

## 2014-02-01 ENCOUNTER — Other Ambulatory Visit (HOSPITAL_COMMUNITY): Payer: Self-pay | Admitting: Nurse Practitioner

## 2014-02-01 ENCOUNTER — Encounter: Payer: Self-pay | Admitting: Family Medicine

## 2014-02-01 ENCOUNTER — Ambulatory Visit (INDEPENDENT_AMBULATORY_CARE_PROVIDER_SITE_OTHER): Payer: Commercial Managed Care - HMO | Admitting: Family Medicine

## 2014-02-01 VITALS — BP 147/88 | HR 64 | Temp 98.8°F | Wt 226.0 lb

## 2014-02-01 DIAGNOSIS — J3489 Other specified disorders of nose and nasal sinuses: Secondary | ICD-10-CM

## 2014-02-01 DIAGNOSIS — B182 Chronic viral hepatitis C: Secondary | ICD-10-CM

## 2014-02-01 DIAGNOSIS — J069 Acute upper respiratory infection, unspecified: Secondary | ICD-10-CM | POA: Insufficient documentation

## 2014-02-01 MED ORDER — CETIRIZINE HCL 10 MG PO TABS: 10.0000 mg | ORAL_TABLET | Freq: Every day | ORAL | Status: DC

## 2014-02-01 MED ORDER — ACETAMINOPHEN 500 MG PO TABS: 500.0000 mg | ORAL_TABLET | Freq: Four times a day (QID) | ORAL | Status: DC | PRN

## 2014-02-01 NOTE — Assessment & Plan Note (Signed)
No signs of pna as patient feared Likely viral URI Will tx with antihistamine Tylenol  Supportive measures Plenty of fluid RTC if needed

## 2014-02-01 NOTE — Patient Instructions (Signed)
Mr Runck it was great to meet you today!  I am sorry that you are not feeling well  You can try zyrtec to help with post nasal drip  I would also suggest tylenol as needed for discomfort  Honeys teas are great for a sore throat   Please return to clinic if symptoms do not improve or worsen Feel better soon Bernadene Bell, MD

## 2014-02-01 NOTE — Progress Notes (Signed)
Patient ID: MCKAI DEATS, male   DOB: 08-26-1950, 63 y.o.   MRN: [id number]   [facility] Bernadene Bell, MD Phone: [phone]  Subjective:  Mr Stanziale is a 63 y.o m who presents for URI  Upper Respiratory Infection: Patient complains of symptoms of a URI. Symptoms include congestion, cough, fever and sore throat. Onset of symptoms was 4 days ago, gradually improving since that time. He also c/o nasal congestion for the past 4 days .  He is drinking plenty of fluids. Evaluation to date: none. Treatment to date: was afraid to take medicine 2/2 blood pressure. Has been having a lot of pain in his throat. Did use a cough drop yesterday but has not used tylenol.   Had flu and pneumo shot Oct 7th 2015.  All relevant systems were reviewed and were negative unless otherwise noted in the HPI  Past Medical History Reviewed problem list.  Medications- reviewed and updated Current Outpatient Prescriptions  Medication Sig Dispense Refill  . amLODipine (NORVASC) 10 MG tablet TAKE 1 TABLET BY MOUTH EVERY NIGHT AT BEDTIME 90 tablet 3  . atenolol (TENORMIN) 50 MG tablet Take 1 tablet (50 mg total) by mouth daily. 90 tablet 3  . benazepril (LOTENSIN) 40 MG tablet Take 1 tablet (40 mg total) by mouth daily. 90 tablet 3  . cloNIDine (CATAPRES) 0.1 MG tablet Take 1 tablet (0.1 mg total) by mouth 2 (two) times daily. 180 tablet 3  . esomeprazole (NEXIUM) 40 MG capsule TAKE 1 CAPSULE BY MOUTH DAILY 90 capsule 3  . fluticasone (FLONASE) 50 MCG/ACT nasal spray Place 2 sprays into the nose daily. 16 g 6  . HARVONI 90-400 MG TABS     . hydrochlorothiazide (HYDRODIURIL) 25 MG tablet Take 1 tablet (25 mg total) by mouth daily. 90 tablet 3  . Multiple Vitamins-Minerals (MENS MULTI VITAMIN & MINERAL) TABS Take 1 tablet by mouth daily.    . pravastatin (PRAVACHOL) 20 MG tablet TAKE ONE TABLET BY MOUTH EVERY DAY 90 tablet 3  . quinapril (ACCUPRIL) 20 MG tablet TAKE 1 TABLET BY MOUTH TWICE  DAILY 180 tablet 3   No current facility-administered medications for this visit.   Chief complaint-noted No additions to family history Social history- patient is a never smoker  Objective: BP 147/88 mmHg  Pulse 64  Temp(Src) 98.8 F (37.1 C) (Other (Comment))  Wt 226 lb (102.513 kg) Gen: NAD, alert, cooperative with exam HEENT: NCAT, EOMI, PERRL, TMs nml, oropharynx with erythema, no blisters seen; nasal turbs inflammed bilat Neck: FROM, supple, no lymphadenopathy CV: RRR, good S1/S2, no murmur, cap refill <3 Resp: CTABL, no wheezes, non-labored, distant breath sounds throughout, no dullness to percussion  Skin: no rashes no lesions  Assessment/Plan: See problem based a/p

## 2014-02-21 ENCOUNTER — Ambulatory Visit (HOSPITAL_COMMUNITY)
Admission: RE | Admit: 2014-02-21 | Discharge: 2014-02-21 | Disposition: A | Discharge status: Home / Self Care | Payer: Commercial Managed Care - HMO | Source: Ambulatory Visit | Attending: Nurse Practitioner | Admitting: Nurse Practitioner

## 2014-02-21 DIAGNOSIS — N2889 Other specified disorders of kidney and ureter: Secondary | ICD-10-CM | POA: Insufficient documentation

## 2014-02-21 DIAGNOSIS — B182 Chronic viral hepatitis C: Secondary | ICD-10-CM | POA: Diagnosis present

## 2014-02-27 ENCOUNTER — Other Ambulatory Visit: Payer: Self-pay | Admitting: Nurse Practitioner

## 2014-02-27 DIAGNOSIS — N2889 Other specified disorders of kidney and ureter: Secondary | ICD-10-CM

## 2014-03-07 ENCOUNTER — Other Ambulatory Visit: Payer: Commercial Managed Care - HMO

## 2014-03-08 ENCOUNTER — Other Ambulatory Visit: Payer: Self-pay | Admitting: Nurse Practitioner

## 2014-03-08 DIAGNOSIS — N2889 Other specified disorders of kidney and ureter: Secondary | ICD-10-CM

## 2014-03-15 ENCOUNTER — Ambulatory Visit
Admission: RE | Admit: 2014-03-15 | Discharge: 2014-03-15 | Disposition: A | Discharge status: Home / Self Care | Payer: Commercial Managed Care - HMO | Source: Ambulatory Visit | Attending: Nurse Practitioner | Admitting: Nurse Practitioner

## 2014-03-15 DIAGNOSIS — N2889 Other specified disorders of kidney and ureter: Secondary | ICD-10-CM

## 2014-03-15 MED ORDER — GADOBENATE DIMEGLUMINE 529 MG/ML IV SOLN
10.0000 mL | Freq: Once | INTRAVENOUS | Status: AC | PRN
  Administered 2014-03-15: 10 mL via INTRAVENOUS

## 2014-03-16 ENCOUNTER — Encounter: Payer: Self-pay | Admitting: Family Medicine

## 2014-03-16 ENCOUNTER — Telehealth: Payer: Self-pay | Admitting: Family Medicine

## 2014-03-16 ENCOUNTER — Telehealth: Payer: Self-pay | Admitting: *Deleted

## 2014-03-16 DIAGNOSIS — N2889 Other specified disorders of kidney and ureter: Secondary | ICD-10-CM | POA: Insufficient documentation

## 2014-03-16 NOTE — Telephone Encounter (Signed)
I am confused on what is needed.Please double check with the Nurse practiotioner [facility]) at GI [phone]. Evidently they ordered MRi to follow up something and found a lesion.   Do they want me to place UROLOGY referral? That is what I think they mean. I have put that in. Please let Mr Hazel know we have placed referral and if he does not hear from urology office some time in next week he needs to cal ME. THANKS! [name]

## 2014-03-16 NOTE — Telephone Encounter (Signed)
Dawn, NP follows pt for Hep C called stated she ordered a MRI of Abdomen.  There was a Cystic renal lesion shown and an urology referral was recommended.  Dawn is unable to place the referral.  MRI was done 03/15/2014; results in EPIC.  Please give her a call at [phone] with questions.  Will forward to PCP and clinic staff.  Derl Barrow, RN

## 2014-03-21 NOTE — Telephone Encounter (Signed)
Referral and letter sent to [facility] urology as an urgent referral.

## 2014-03-21 NOTE — Telephone Encounter (Signed)
Referral and letter sent to [facility] urology as an urgent referral.

## 2014-04-10 ENCOUNTER — Other Ambulatory Visit: Payer: Self-pay | Admitting: Family Medicine

## 2014-04-10 DIAGNOSIS — I10 Essential (primary) hypertension: Secondary | ICD-10-CM

## 2014-04-11 NOTE — Assessment & Plan Note (Signed)
Refill meds

## 2014-05-24 ENCOUNTER — Encounter: Payer: Self-pay | Admitting: Family Medicine

## 2014-05-24 ENCOUNTER — Ambulatory Visit (INDEPENDENT_AMBULATORY_CARE_PROVIDER_SITE_OTHER): Payer: Commercial Managed Care - HMO | Admitting: Family Medicine

## 2014-05-24 VITALS — BP 146/77 | HR 58 | Temp 98.3°F | Ht 72.0 in | Wt 220.0 lb

## 2014-05-24 DIAGNOSIS — M25551 Pain in right hip: Secondary | ICD-10-CM | POA: Diagnosis not present

## 2014-05-24 DIAGNOSIS — C649 Malignant neoplasm of unspecified kidney, except renal pelvis: Secondary | ICD-10-CM | POA: Diagnosis not present

## 2014-05-24 DIAGNOSIS — M545 Low back pain: Secondary | ICD-10-CM

## 2014-05-24 DIAGNOSIS — G8929 Other chronic pain: Secondary | ICD-10-CM

## 2014-05-24 DIAGNOSIS — M159 Polyosteoarthritis, unspecified: Secondary | ICD-10-CM

## 2014-05-24 DIAGNOSIS — M150 Primary generalized (osteo)arthritis: Secondary | ICD-10-CM

## 2014-05-24 DIAGNOSIS — R5383 Other fatigue: Secondary | ICD-10-CM

## 2014-05-24 DIAGNOSIS — I10 Essential (primary) hypertension: Secondary | ICD-10-CM

## 2014-05-24 DIAGNOSIS — B1710 Acute hepatitis C without hepatic coma: Secondary | ICD-10-CM

## 2014-05-24 DIAGNOSIS — M25559 Pain in unspecified hip: Secondary | ICD-10-CM | POA: Insufficient documentation

## 2014-05-24 DIAGNOSIS — N2889 Other specified disorders of kidney and ureter: Secondary | ICD-10-CM | POA: Insufficient documentation

## 2014-05-24 LAB — TSH: TSH: 1.295 u[IU]/mL (ref 0.350–4.500)

## 2014-05-24 LAB — CBC WITH DIFFERENTIAL/PLATELET
Basophils Absolute: 0.1 10*3/uL (ref 0.0–0.1)
Basophils Relative: 1 % (ref 0–1)
Eosinophils Absolute: 0.5 10*3/uL (ref 0.0–0.7)
Eosinophils Relative: 9 % — ABNORMAL HIGH (ref 0–5)
HEMATOCRIT: 47.5 % (ref 39.0–52.0)
Hemoglobin: 15.9 g/dL (ref 13.0–17.0)
LYMPHS PCT: 28 % (ref 12–46)
Lymphs Abs: 1.6 10*3/uL (ref 0.7–4.0)
MCH: 29.1 pg (ref 26.0–34.0)
MCHC: 33.5 g/dL (ref 30.0–36.0)
MCV: 87 fL (ref 78.0–100.0)
MONOS PCT: 7 % (ref 3–12)
MPV: 10.6 fL (ref 8.6–12.4)
Monocytes Absolute: 0.4 10*3/uL (ref 0.1–1.0)
NEUTROS PCT: 55 % (ref 43–77)
Neutro Abs: 3.1 10*3/uL (ref 1.7–7.7)
Platelets: 227 10*3/uL (ref 150–400)
RBC: 5.46 MIL/uL (ref 4.22–5.81)
RDW: 14.3 % (ref 11.5–15.5)
WBC: 5.7 10*3/uL (ref 4.0–10.5)

## 2014-05-24 LAB — COMPREHENSIVE METABOLIC PANEL
ALK PHOS: 72 U/L (ref 39–117)
ALT: 14 U/L (ref 0–53)
AST: 20 U/L (ref 0–37)
Albumin: 4.1 g/dL (ref 3.5–5.2)
BUN: 28 mg/dL — ABNORMAL HIGH (ref 6–23)
CO2: 26 meq/L (ref 19–32)
Calcium: 9.3 mg/dL (ref 8.4–10.5)
Chloride: 102 mEq/L (ref 96–112)
Creat: 1.82 mg/dL — ABNORMAL HIGH (ref 0.50–1.35)
Glucose, Bld: 105 mg/dL — ABNORMAL HIGH (ref 70–99)
Potassium: 3.8 mEq/L (ref 3.5–5.3)
SODIUM: 140 meq/L (ref 135–145)
TOTAL PROTEIN: 7.4 g/dL (ref 6.0–8.3)
Total Bilirubin: 0.6 mg/dL (ref 0.2–1.2)

## 2014-05-24 LAB — CK: CK TOTAL: 112 U/L (ref 7–232)

## 2014-05-24 MED ORDER — TRAMADOL HCL 50 MG PO TABS: 100.0000 mg | ORAL_TABLET | Freq: Three times a day (TID) | ORAL | Status: DC | PRN

## 2014-05-24 NOTE — Patient Instructions (Signed)
Get your hip x rays done in next couple of weeks. Order is in already I will send you a note about your labs. Let me see you back in about 3-4 weeks so we can follow up some of these issues. We are trying you back on tramadol for your hip pain. Great to see you!

## 2014-05-25 NOTE — Progress Notes (Signed)
   Subjective:    Patient ID: Edward Morgan, male    DOB: Mar 16, 1950, 64 y.o.   MRN: [id number]  HPI  he was initially scheduled for CPE but he is really here with several issues. #1. Right hip pain. Much worse over the last few months. Pain with walking. Pain with sitting. Having to curtail his activities. Recalls no specific hip injury #2. Having lots of myalgias and fatigue. He finished his treatment for hepatitis C and did notice that he felt a little more energetic after he was done with the treatment but still is not back to his baseline.  #3. I had seen him for follow-up of the renal mass noted on MRI. We sent him to the urologist. I have not received any information from the recent urologist but he tells me that he has some type of renal cancer him what his kidneys and a benign mass on the other one. They gave him 3 options he chose to wait for follow-up with a repeat MRI 6 months after the original MRI. He is not having any blood in his urine. He is having no back pain.    Review of Systems  he's not had unusual weight change. Denies fever, sweats, chills. See history of present illness above.    Objective:   Physical Exam  Vital signs reviewed. GENERAL: Well-developed, well-nourished, no acute distress. CARDIOVASCULAR: Regular rate and rhythm no murmur gallop or rub LUNGS: Clear to auscultation bilaterally, no rales or wheeze. ABDOMEN: Soft positive bowel sounds  BACK: Nontender to [place] over the CVA. MSK: right hip decreased internal and external rotation. Significant pain with internal and external rotation. Left hip mildly decreased internal rotation but full external rotation and movement is painless.        Assessment & Plan:

## 2014-05-25 NOTE — Assessment & Plan Note (Signed)
Significant loss of motion in the right hip. I suspect he's got significant DJD there. We'll get some x-rays. He may need total hip replacement. I'll see him back in 2 weeks. In the interim I'll start him back on tramadol pain medication. He has used this once in the past with good success. Given his remote history of alcohol abuse I would be hasn't to start him on any type of narcotic.

## 2014-05-25 NOTE — Assessment & Plan Note (Signed)
He's completed his treatment. I'm not sure if this is was giving him residual fatigue and myalgias but I doubt it. He reports undetectable viral load.

## 2014-05-25 NOTE — Assessment & Plan Note (Signed)
I'll have to get his nose from the urologist. It's very unclear exactly what's going on. If he does have renal cell carcinoma, this could be causing his myalgias and fatigue. We'll discuss when I see him back in 2 weeks

## 2014-06-06 ENCOUNTER — Other Ambulatory Visit: Payer: Self-pay | Admitting: Family Medicine

## 2014-06-06 ENCOUNTER — Ambulatory Visit
Admission: RE | Admit: 2014-06-06 | Discharge: 2014-06-06 | Disposition: A | Discharge status: Home / Self Care | Payer: Commercial Managed Care - HMO | Source: Ambulatory Visit | Attending: Family Medicine | Admitting: Family Medicine

## 2014-06-06 DIAGNOSIS — M25551 Pain in right hip: Secondary | ICD-10-CM

## 2014-06-06 DIAGNOSIS — M159 Polyosteoarthritis, unspecified: Secondary | ICD-10-CM

## 2014-06-06 DIAGNOSIS — M150 Primary generalized (osteo)arthritis: Secondary | ICD-10-CM

## 2014-06-21 ENCOUNTER — Encounter: Payer: Self-pay | Admitting: Family Medicine

## 2014-06-21 ENCOUNTER — Ambulatory Visit (INDEPENDENT_AMBULATORY_CARE_PROVIDER_SITE_OTHER): Payer: Commercial Managed Care - HMO | Admitting: Family Medicine

## 2014-06-21 VITALS — BP 145/66 | HR 56 | Temp 98.3°F | Ht 72.0 in | Wt 219.7 lb

## 2014-06-21 DIAGNOSIS — M159 Polyosteoarthritis, unspecified: Secondary | ICD-10-CM

## 2014-06-21 DIAGNOSIS — C649 Malignant neoplasm of unspecified kidney, except renal pelvis: Secondary | ICD-10-CM

## 2014-06-21 DIAGNOSIS — M150 Primary generalized (osteo)arthritis: Secondary | ICD-10-CM

## 2014-06-21 MED ORDER — TAMSULOSIN HCL 0.4 MG PO CAPS: 0.4000 mg | ORAL_CAPSULE | Freq: Every day | ORAL | Status: DC

## 2014-06-22 NOTE — Assessment & Plan Note (Signed)
Follow-up discussion about his renal mass. I had gotten the office notes from his urologist. We discussed that. He is fairly certain he wants to follow with the conservative management and repeat MRI in 6 months as planned. He's not having any symptoms of pain in the kidney area, no blood in his urine, no unusual weakness. He is somewhat worried about this but feels like this is the best approach for him.

## 2014-06-22 NOTE — Assessment & Plan Note (Signed)
Right hip pain. We reviewed his x-rays which show minimal to moderate OA however his clinical exam is consistent with moderate to severe OA of the hip. I told him the next step would be did have evaluation by orthopedist. Given what's going on with his renal cancer he doesn't to pursue that right now if he doesn't have to. We had tried him on tramadol last office visit he said that's working well. He will suggest continue conservative management with the tramadol at this time. I'll see him back in 3 months, sooner with problems.

## 2014-06-22 NOTE — Progress Notes (Signed)
   Subjective:    Patient ID: Edward Morgan, male    DOB: 1951/01/16, 64 y.o.   MRN: [id number]  HPI #1. Follow-up hip pain. The tramadol seems to working pretty well for him. Continues to have the pain but it's much better tolerated in the pain medicine really helps him sleep at night #2. 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 I was going to get the office notes and he wanted to discuss his options. He is here with his wife today. #3. Difficulty starting his urinary stream. We have previously given him a prescription for Flomax but he was unable to afford it. He would like to retry that now.    Review of Systems Continues to have right hip pain but it's improved on the pain medicines. He's not had any fever, sweats, chills. No blood in his urine. No change in urinary stream, no frequency. He is having some difficulty starting the stream. No unusual weight change. No abdominal pain..    Objective:   Physical Exam Vital signs reviewed. GENERAL: Well-developed, well-nourished, no acute distress. CARDIOVASCULAR: Regular rate and rhythm no murmur gallop or rub LUNGS: Clear to auscultation bilaterally, no rales or wheeze. ABDOMEN: Soft positive bowel sounds MSK: Movement of extremity x 4. Right hip internal and external rotation is about 50% of that of the left side. Internal rotation is very painful.         Assessment & Plan:

## 2014-07-19 ENCOUNTER — Encounter: Payer: Self-pay | Admitting: Gastroenterology

## 2014-08-16 ENCOUNTER — Other Ambulatory Visit: Payer: Self-pay | Admitting: Urology

## 2014-08-16 DIAGNOSIS — N281 Cyst of kidney, acquired: Secondary | ICD-10-CM

## 2014-09-11 ENCOUNTER — Ambulatory Visit (HOSPITAL_COMMUNITY)
Admission: RE | Admit: 2014-09-11 | Discharge: 2014-09-11 | Disposition: A | Discharge status: Home / Self Care | Payer: Commercial Managed Care - HMO | Source: Ambulatory Visit | Attending: Urology | Admitting: Urology

## 2014-09-11 ENCOUNTER — Other Ambulatory Visit: Payer: Self-pay | Admitting: Urology

## 2014-09-11 DIAGNOSIS — N289 Disorder of kidney and ureter, unspecified: Secondary | ICD-10-CM | POA: Insufficient documentation

## 2014-09-11 DIAGNOSIS — N281 Cyst of kidney, acquired: Secondary | ICD-10-CM

## 2014-09-11 LAB — POCT I-STAT CREATININE: CREATININE: 2.6 mg/dL — AB (ref 0.61–1.24)

## 2014-10-26 ENCOUNTER — Telehealth: Payer: Self-pay | Admitting: Family Medicine

## 2014-10-26 MED ORDER — AMLODIPINE BESYLATE 10 MG PO TABS: 10.0000 mg | ORAL_TABLET | Freq: Every day | ORAL | Status: DC

## 2014-10-26 MED ORDER — QUINAPRIL HCL 20 MG PO TABS: 20.0000 mg | ORAL_TABLET | Freq: Two times a day (BID) | ORAL | Status: DC

## 2014-10-26 NOTE — Telephone Encounter (Signed)
Refilled other medications.  Did not see Besylate on medication list.  Derl Barrow, RN

## 2014-10-26 NOTE — Telephone Encounter (Signed)
Needs refills on his meds: acupril amlodopine Besylate  Has appt on sept [facility]  [address]

## 2014-10-29 MED ORDER — AMLODIPINE BESYLATE 10 MG PO TABS: 10.0000 mg | ORAL_TABLET | Freq: Every day | ORAL | Status: DC

## 2014-10-29 NOTE — Addendum Note (Signed)
Addended byDorcas Mcmurray L on: 10/29/2014 11:26 AM   Modules accepted: Orders

## 2014-11-06 ENCOUNTER — Other Ambulatory Visit: Payer: Self-pay | Admitting: *Deleted

## 2014-11-06 NOTE — Telephone Encounter (Signed)
Refill request for 90 day supply.  Emit Kuenzel L, RN  

## 2014-11-07 MED ORDER — QUINAPRIL HCL 20 MG PO TABS: 20.0000 mg | ORAL_TABLET | Freq: Two times a day (BID) | ORAL | Status: DC

## 2014-11-07 MED ORDER — AMLODIPINE BESYLATE 10 MG PO TABS: 10.0000 mg | ORAL_TABLET | Freq: Every day | ORAL | Status: DC

## 2014-11-08 ENCOUNTER — Encounter: Payer: Self-pay | Admitting: Family Medicine

## 2014-11-08 ENCOUNTER — Telehealth: Payer: Self-pay | Admitting: Family Medicine

## 2014-11-08 DIAGNOSIS — T40605A Adverse effect of unspecified narcotics, initial encounter: Secondary | ICD-10-CM | POA: Insufficient documentation
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
## 2014-11-08 NOTE — Telephone Encounter (Signed)
I have discontinued his refills on tramadol and made notation in chart Please let her know THANKS! Clarise Cruz Neal\

## 2014-11-09 NOTE — Telephone Encounter (Signed)
Pt wife informed of below. Zimmerman Rumple, Sarabi Sockwell D, CMA  

## 2014-11-15 ENCOUNTER — Encounter: Payer: Self-pay | Admitting: Family Medicine

## 2014-11-15 ENCOUNTER — Ambulatory Visit (INDEPENDENT_AMBULATORY_CARE_PROVIDER_SITE_OTHER): Payer: Commercial Managed Care - HMO | Admitting: Family Medicine

## 2014-11-15 VITALS — BP 131/72 | HR 56 | Temp 98.2°F | Ht 72.0 in | Wt 215.3 lb

## 2014-11-15 DIAGNOSIS — M150 Primary generalized (osteo)arthritis: Secondary | ICD-10-CM

## 2014-11-15 DIAGNOSIS — N184 Chronic kidney disease, stage 4 (severe): Secondary | ICD-10-CM

## 2014-11-15 DIAGNOSIS — I10 Essential (primary) hypertension: Secondary | ICD-10-CM | POA: Diagnosis not present

## 2014-11-15 DIAGNOSIS — N2889 Other specified disorders of kidney and ureter: Secondary | ICD-10-CM

## 2014-11-15 DIAGNOSIS — C649 Malignant neoplasm of unspecified kidney, except renal pelvis: Secondary | ICD-10-CM | POA: Diagnosis not present

## 2014-11-15 DIAGNOSIS — Z23 Encounter for immunization: Secondary | ICD-10-CM | POA: Diagnosis not present

## 2014-11-15 DIAGNOSIS — M159 Polyosteoarthritis, unspecified: Secondary | ICD-10-CM

## 2014-11-15 LAB — BASIC METABOLIC PANEL
BUN: 26 mg/dL — AB (ref 7–25)
CHLORIDE: 100 mmol/L (ref 98–110)
CO2: 24 mmol/L (ref 20–31)
CREATININE: 1.96 mg/dL — AB (ref 0.70–1.25)
Calcium: 9 mg/dL (ref 8.6–10.3)
Glucose, Bld: 93 mg/dL (ref 65–99)
Potassium: 4 mmol/L (ref 3.5–5.3)
Sodium: 138 mmol/L (ref 135–146)

## 2014-11-15 MED ORDER — ESOMEPRAZOLE MAGNESIUM 40 MG PO CPDR: 40.0000 mg | DELAYED_RELEASE_CAPSULE | Freq: Every day | ORAL | Status: DC

## 2014-11-15 NOTE — Assessment & Plan Note (Signed)
His creatinine was quite elevated when he was at the urologist office. We will repeat today. Likely he will need to see nephrology.

## 2014-11-15 NOTE — Assessment & Plan Note (Signed)
He one refill on his tramadol. I informed him that his wife had called and requested we give him no more tramadol. We discussed the issues. I will not be giving him any tramadol prescriptions today.

## 2014-11-15 NOTE — Assessment & Plan Note (Signed)
The mass on his kidney has not changed. He needs an updated referral as his urologist wants him to go back in 6 months for repeat scan.

## 2014-11-15 NOTE — Progress Notes (Signed)
   Subjective:    Patient ID: Edward Morgan, male    DOB: January 05, 1951, 64 y.o.   MRN: [id number]  HPI    Review of Systems     Objective:   Physical Exam        Assessment & Plan:

## 2014-11-16 ENCOUNTER — Other Ambulatory Visit: Payer: Self-pay | Admitting: *Deleted

## 2014-11-17 ENCOUNTER — Encounter: Payer: Self-pay | Admitting: Family Medicine

## 2014-12-31 ENCOUNTER — Other Ambulatory Visit: Payer: Self-pay | Admitting: Family Medicine

## 2015-01-01 ENCOUNTER — Telehealth: Payer: Self-pay | Admitting: *Deleted

## 2015-01-01 MED ORDER — QUINAPRIL HCL 20 MG PO TABS
20.0000 mg | ORAL_TABLET | Freq: Two times a day (BID) | ORAL | Status: DC
Start: 2015-01-01 — End: 2015-01-03

## 2015-01-01 NOTE — Telephone Encounter (Signed)
Refill for Quinapril 20 mg completed.  Derl Barrow, RN

## 2015-01-03 ENCOUNTER — Telehealth: Payer: Self-pay | Admitting: Family Medicine

## 2015-01-03 ENCOUNTER — Other Ambulatory Visit: Payer: Self-pay | Admitting: Family Medicine

## 2015-01-03 MED ORDER — QUINAPRIL HCL 20 MG PO TABS: 20.0000 mg | ORAL_TABLET | Freq: Two times a day (BID) | ORAL | Status: DC

## 2015-01-03 NOTE — Telephone Encounter (Signed)
Patient is needing a refill of his lisinopril.  [facility] [address]  He gets a 3 month supply.

## 2015-01-04 NOTE — Telephone Encounter (Signed)
It is quinapril and it is already dine THANKS! [name]

## 2015-02-28 ENCOUNTER — Other Ambulatory Visit: Payer: Self-pay | Admitting: Urology

## 2015-02-28 DIAGNOSIS — N281 Cyst of kidney, acquired: Secondary | ICD-10-CM

## 2015-03-19 ENCOUNTER — Ambulatory Visit (HOSPITAL_COMMUNITY)
Admission: RE | Admit: 2015-03-19 | Discharge: 2015-03-19 | Disposition: A | Discharge status: Home / Self Care | Payer: Medicare HMO | Source: Ambulatory Visit | Attending: Urology | Admitting: Urology

## 2015-03-19 DIAGNOSIS — N281 Cyst of kidney, acquired: Secondary | ICD-10-CM

## 2015-03-19 DIAGNOSIS — N2889 Other specified disorders of kidney and ureter: Secondary | ICD-10-CM | POA: Diagnosis not present

## 2015-03-19 LAB — POCT I-STAT CREATININE: Creatinine, Ser: 1.8 mg/dL — ABNORMAL HIGH (ref 0.61–1.24)

## 2015-03-19 MED ORDER — GADOBENATE DIMEGLUMINE 529 MG/ML IV SOLN
20.0000 mL | Freq: Once | INTRAVENOUS | Status: AC | PRN
  Administered 2015-03-19: 20 mL via INTRAVENOUS

## 2015-04-18 ENCOUNTER — Other Ambulatory Visit: Payer: Self-pay | Admitting: *Deleted

## 2015-04-18 ENCOUNTER — Other Ambulatory Visit: Payer: Self-pay | Admitting: Family Medicine

## 2015-04-18 MED ORDER — HYDROCHLOROTHIAZIDE 25 MG PO TABS: 25.0000 mg | ORAL_TABLET | Freq: Every day | ORAL | Status: DC

## 2015-04-18 MED ORDER — ATENOLOL 50 MG PO TABS: 50.0000 mg | ORAL_TABLET | Freq: Every day | ORAL | Status: DC

## 2015-04-18 MED ORDER — CLONIDINE HCL 0.1 MG PO TABS: 0.1000 mg | ORAL_TABLET | Freq: Two times a day (BID) | ORAL | Status: DC

## 2015-04-19 ENCOUNTER — Other Ambulatory Visit: Payer: Self-pay | Admitting: *Deleted

## 2015-04-19 MED ORDER — QUINAPRIL HCL 20 MG PO TABS: 20.0000 mg | ORAL_TABLET | Freq: Two times a day (BID) | ORAL | Status: DC

## 2015-05-16 ENCOUNTER — Ambulatory Visit (INDEPENDENT_AMBULATORY_CARE_PROVIDER_SITE_OTHER): Payer: Commercial Managed Care - HMO | Admitting: Family Medicine

## 2015-05-16 ENCOUNTER — Encounter: Payer: Self-pay | Admitting: Family Medicine

## 2015-05-16 VITALS — BP 126/67 | HR 63 | Temp 98.5°F | Ht 72.0 in | Wt 216.2 lb

## 2015-05-16 DIAGNOSIS — M25551 Pain in right hip: Secondary | ICD-10-CM

## 2015-05-16 DIAGNOSIS — I10 Essential (primary) hypertension: Secondary | ICD-10-CM | POA: Diagnosis not present

## 2015-05-16 DIAGNOSIS — N2889 Other specified disorders of kidney and ureter: Secondary | ICD-10-CM | POA: Diagnosis not present

## 2015-05-16 DIAGNOSIS — N184 Chronic kidney disease, stage 4 (severe): Secondary | ICD-10-CM | POA: Diagnosis not present

## 2015-05-16 DIAGNOSIS — E669 Obesity, unspecified: Secondary | ICD-10-CM

## 2015-05-16 MED ORDER — AMLODIPINE BESYLATE 10 MG PO TABS: 10.0000 mg | ORAL_TABLET | Freq: Every day | ORAL | Status: DC

## 2015-05-16 MED ORDER — TAMSULOSIN HCL 0.4 MG PO CAPS: 0.4000 mg | ORAL_CAPSULE | Freq: Every day | ORAL | Status: DC

## 2015-05-16 NOTE — Assessment & Plan Note (Signed)
We discussed weight loss.

## 2015-05-16 NOTE — Progress Notes (Signed)
   Subjective:    Patient ID: Edward Morgan, male    DOB: 04/25/1950, 65 y.o.   MRN: [id number]  HPI #1. Hypertension: Taking his medicines regularly. He needs a refill on one of them. We just refilled the others. No problems with medications. #2. Hip pain: Continues to have right groin and hip pain particularly with a lot of standing or walking. He does not want to pursue any orthopedic consult at this time because she's not ready to have his hip replaced. #3. Renal mass: Recently seen by his urologist and his mass is actually smaller. He is quite excited about this #4. Weight: He feels like he's gained a little bit of weight recently because he has not been as active secondary to his hip pain. He like to lose 4-6 pounds.   Review of Systems Denies chest pain, denies shortness of breath with exertion. No change in neck size tolerance. No lower extremity edema. No palpitations. No headache, no visual changes.    Objective:   Physical Exam  Vital signs reviewed. GENERAL: Well-developed, well-nourished, no acute distress. CARDIOVASCULAR: Regular rate and rhythm no murmur gallop or rub LUNGS: Clear to auscultation bilaterally, no rales or wheeze. ABDOMEN: Soft positive bowel sounds NEURO: No gross focal neurological deficits. MSK: Movement of extremity x 4.        Assessment & Plan:

## 2015-05-16 NOTE — Assessment & Plan Note (Signed)
Continued right hip pain. He does not want to pursue orthopedic evaluation at this time.

## 2015-05-16 NOTE — Assessment & Plan Note (Signed)
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

## 2015-05-16 NOTE — Assessment & Plan Note (Signed)
Well-controlled on current medication regimen so we'll not change that. I want to get creatinine checked today but he did not want to give any blood work. He agrees to get that when he comes back in 6 months.

## 2015-05-16 NOTE — Assessment & Plan Note (Signed)
Want to recheck his creatinine bit he does not want to get blood work today so will recheck at his next office visit.

## 2015-05-21 ENCOUNTER — Other Ambulatory Visit: Payer: Self-pay | Admitting: *Deleted

## 2015-05-21 MED ORDER — HYDROCHLOROTHIAZIDE 25 MG PO TABS: 25.0000 mg | ORAL_TABLET | Freq: Every day | ORAL | Status: DC

## 2015-05-21 MED ORDER — ATENOLOL 50 MG PO TABS: 50.0000 mg | ORAL_TABLET | Freq: Every day | ORAL | Status: DC

## 2015-07-06 ENCOUNTER — Other Ambulatory Visit: Payer: Self-pay | Admitting: *Deleted

## 2015-07-10 MED ORDER — CLONIDINE HCL 0.1 MG PO TABS: 0.1000 mg | ORAL_TABLET | Freq: Two times a day (BID) | ORAL | Status: DC

## 2015-09-15 ENCOUNTER — Other Ambulatory Visit: Payer: Self-pay | Admitting: Family Medicine

## 2015-10-03 ENCOUNTER — Ambulatory Visit (INDEPENDENT_AMBULATORY_CARE_PROVIDER_SITE_OTHER): Payer: Commercial Managed Care - HMO | Admitting: Family Medicine

## 2015-10-03 ENCOUNTER — Encounter: Payer: Self-pay | Admitting: Family Medicine

## 2015-10-03 ENCOUNTER — Ambulatory Visit: Payer: Commercial Managed Care - HMO | Admitting: Family Medicine

## 2015-10-03 VITALS — BP 126/69 | HR 55 | Temp 97.8°F | Ht 72.0 in | Wt 216.2 lb

## 2015-10-03 DIAGNOSIS — N184 Chronic kidney disease, stage 4 (severe): Secondary | ICD-10-CM | POA: Diagnosis not present

## 2015-10-03 DIAGNOSIS — N2889 Other specified disorders of kidney and ureter: Secondary | ICD-10-CM

## 2015-10-03 DIAGNOSIS — I10 Essential (primary) hypertension: Secondary | ICD-10-CM

## 2015-10-03 DIAGNOSIS — K219 Gastro-esophageal reflux disease without esophagitis: Secondary | ICD-10-CM

## 2015-10-03 DIAGNOSIS — M25551 Pain in right hip: Secondary | ICD-10-CM

## 2015-10-03 LAB — LIPID PANEL
CHOLESTEROL: 144 mg/dL (ref 125–200)
HDL: 40 mg/dL (ref >=40)
LDL Cholesterol: 87 mg/dL (ref <130)
Total CHOL/HDL Ratio: 3.6 Ratio (ref <=5.0)
Triglycerides: 86 mg/dL (ref <150)
VLDL: 17 mg/dL (ref <30)

## 2015-10-03 LAB — COMPLETE METABOLIC PANEL WITH GFR
ALK PHOS: 64 U/L (ref 40–115)
ALT: 13 U/L (ref 9–46)
AST: 19 U/L (ref 10–35)
Albumin: 3.9 g/dL (ref 3.6–5.1)
BUN: 26 mg/dL — ABNORMAL HIGH (ref 7–25)
CALCIUM: 9.2 mg/dL (ref 8.6–10.3)
CO2: 29 mmol/L (ref 20–31)
CREATININE: 1.72 mg/dL — AB (ref 0.70–1.25)
Chloride: 99 mmol/L (ref 98–110)
GFR, Est African American: 47 mL/min — ABNORMAL LOW (ref >=60)
GFR, Est Non African American: 40 mL/min — ABNORMAL LOW (ref >=60)
GLUCOSE: 102 mg/dL — AB (ref 65–99)
Potassium: 3.6 mmol/L (ref 3.5–5.3)
SODIUM: 137 mmol/L (ref 135–146)
Total Bilirubin: 0.6 mg/dL (ref 0.2–1.2)
Total Protein: 6.6 g/dL (ref 6.1–8.1)

## 2015-10-03 NOTE — Patient Instructions (Addendum)
I have placed a referral to a Pain Management Clinic for your hip pain.  I will send you a note about your lab work. Let me see you back in about 6 months.Great to see you!

## 2015-10-04 NOTE — Assessment & Plan Note (Signed)
Good control. Lab work today. Continue current medications without change. Refills given.

## 2015-10-04 NOTE — Assessment & Plan Note (Signed)
Her only stable on PPI. Past attempts to discontinue PPI have resulted in break through symptoms. He'll continue for the next 3-6 months and then reassess.

## 2015-10-04 NOTE — Progress Notes (Signed)
    CHIEF COMPLAINT / HPI:   #1. He says he's here for checkup and well visit. Has several issues. #2. Hip pain continues to be bothersome for him. He does not want to consider a type of surgery right now but would like to be referred to a pain clinic to potentially get his hip pain managed through them.  #3. Chronic problems: He reports taking all of his medicines including his blood pressure medicine, cholesterol medicine and heartburn medicine rarely without any problem. Denies chest pain or shortness of breath. He's had no change in his energy level. He has lost a little bit of weight, 3 pounds, that was intentional. He is worried about getting overweight since he is not as active secondary to his hip pain.   REVIEW OF SYSTEMS: No headaches, no visual changes, no sleep problems. He does have hip pain is per history of present illness. No other unusual joint pains or myalgias. Please see HPI for additional elements of ROS.   OBJECTIVE:  Vital signs are reviewed.   Vital signs reviewed GENERALl: Well developed, well nourished, in no acute distress. NECK: Supple, FROM, without lymphadenopathy.  THYROID: normal without nodularity CAROTID ARTERIES: without bruits LUNGS: clear to auscultation bilaterally. No wheezes or rales. HEART: Regular rate and rhythm, no murmurs ABDOMEN: soft with positive bowel sounds MSK: MOE x 4. Normal gait. Rises from a chair without any assistance. Symmetrical and normal muscle bulk and strength in upper and lower extremities. SKIN no rash noted on complete skin check. NEURO: no focal deficits PSYCH: Alert and oriented 4. Affects interactive. Speech is normal in content and fluency. Judgment is normal   ASSESSMENT / PLAN: Please see problem oriented charting for details Health maintenance up to date except flu shot which he wants to consider at another time 9in a few months possibly--he is getting blood work today)

## 2015-10-04 NOTE — Assessment & Plan Note (Addendum)
His renal mass has been stable. Is now considered Bosniak 2. Previously Bosniak 3. Has repeat MRI and f/u wurology sometime between Dec and Feb. Will likely need updated referral to Urology if after 1st of year and Itold him to call us if this is the cae and we will renew referral.

## 2015-10-04 NOTE — Assessment & Plan Note (Signed)
Briefly discussed with patient. It is unclear if pain clinic will accept him as a patient. He reports that the whole incident with his wife calling here was "some of her craziness".

## 2015-10-04 NOTE — Assessment & Plan Note (Signed)
He would like a referral for pain clinic I will put that in for him.

## 2015-10-04 NOTE — Assessment & Plan Note (Signed)
>>  ASSESSMENT AND PLAN FOR OPIATES AND RELATED NARCOTICS CAUSING ADVERSE EFFECT IN THERAPEUTIC USE WRITTEN ON 10/04/2015  9:10 AM BY Chrisopher Pustejovsky L, MD  Briefly discussed with patient. It is unclear if pain clinic will accept him as a patient. He reports that the whole incident with his wife calling here was "some of her craziness".

## 2015-10-15 ENCOUNTER — Other Ambulatory Visit: Payer: Self-pay | Admitting: *Deleted

## 2015-10-15 ENCOUNTER — Other Ambulatory Visit: Payer: Self-pay | Admitting: Family Medicine

## 2015-10-15 MED ORDER — ESOMEPRAZOLE MAGNESIUM 40 MG PO CPDR: 40.0000 mg | DELAYED_RELEASE_CAPSULE | Freq: Every day | ORAL | 3 refills | Status: DC

## 2015-10-15 MED ORDER — CLONIDINE HCL 0.1 MG PO TABS: 0.1000 mg | ORAL_TABLET | Freq: Two times a day (BID) | ORAL | 3 refills | Status: DC

## 2015-10-15 MED ORDER — HYDROCHLOROTHIAZIDE 25 MG PO TABS: ORAL_TABLET | ORAL | 3 refills | Status: DC

## 2015-10-15 MED ORDER — QUINAPRIL HCL 20 MG PO TABS: 20.0000 mg | ORAL_TABLET | Freq: Two times a day (BID) | ORAL | 3 refills | Status: DC

## 2015-10-15 MED ORDER — PRAVASTATIN SODIUM 20 MG PO TABS: 20.0000 mg | ORAL_TABLET | Freq: Every day | ORAL | 1 refills | Status: DC

## 2015-10-15 MED ORDER — ATENOLOL 50 MG PO TABS: 50.0000 mg | ORAL_TABLET | Freq: Every day | ORAL | 3 refills | Status: DC

## 2015-10-15 MED ORDER — TAMSULOSIN HCL 0.4 MG PO CAPS: 0.4000 mg | ORAL_CAPSULE | Freq: Every day | ORAL | 3 refills | Status: DC

## 2015-10-15 NOTE — Telephone Encounter (Signed)
Pt calling statin gthat he saw dr Nori Riis on 8/9 and all his medicines wasn't refilled. Please advise. Deseree Kennon Holter, CMA

## 2015-10-23 ENCOUNTER — Encounter: Payer: Self-pay | Admitting: Family Medicine

## 2015-10-27 ENCOUNTER — Other Ambulatory Visit: Payer: Self-pay | Admitting: Family Medicine

## 2015-11-02 ENCOUNTER — Telehealth: Payer: Self-pay

## 2015-11-02 NOTE — Telephone Encounter (Signed)
Opened in error. Cancel phone message

## 2015-11-16 ENCOUNTER — Telehealth: Payer: Self-pay | Admitting: Family Medicine

## 2015-11-16 NOTE — Telephone Encounter (Signed)
Daughter called and would like her father to go to the same pain clinic that she goes to on [facility] since the one is [place] is to far. jw

## 2015-11-19 NOTE — Telephone Encounter (Signed)
Tia  I will leave this in your hands to ee if possible Let me know what you need THANKS! [name]

## 2015-11-22 NOTE — Telephone Encounter (Signed)
Referral has been faxed to [facility], (faxed on 11/21/15) the same office wife and daughter go to.

## 2015-12-05 ENCOUNTER — Ambulatory Visit (INDEPENDENT_AMBULATORY_CARE_PROVIDER_SITE_OTHER): Payer: Commercial Managed Care - HMO

## 2015-12-05 ENCOUNTER — Ambulatory Visit: Payer: Commercial Managed Care - HMO | Admitting: Family Medicine

## 2015-12-05 DIAGNOSIS — Z23 Encounter for immunization: Secondary | ICD-10-CM

## 2016-02-06 DIAGNOSIS — M25562 Pain in left knee: Secondary | ICD-10-CM | POA: Diagnosis not present

## 2016-02-06 DIAGNOSIS — G894 Chronic pain syndrome: Secondary | ICD-10-CM | POA: Diagnosis not present

## 2016-02-06 DIAGNOSIS — M545 Low back pain: Secondary | ICD-10-CM | POA: Diagnosis not present

## 2016-02-06 DIAGNOSIS — M25559 Pain in unspecified hip: Secondary | ICD-10-CM | POA: Diagnosis not present

## 2016-03-05 DIAGNOSIS — G894 Chronic pain syndrome: Secondary | ICD-10-CM | POA: Diagnosis not present

## 2016-03-05 DIAGNOSIS — M25562 Pain in left knee: Secondary | ICD-10-CM | POA: Diagnosis not present

## 2016-03-05 DIAGNOSIS — M545 Low back pain: Secondary | ICD-10-CM | POA: Diagnosis not present

## 2016-03-05 DIAGNOSIS — M25559 Pain in unspecified hip: Secondary | ICD-10-CM | POA: Diagnosis not present

## 2016-04-01 DIAGNOSIS — N281 Cyst of kidney, acquired: Secondary | ICD-10-CM | POA: Diagnosis not present

## 2016-04-02 DIAGNOSIS — G894 Chronic pain syndrome: Secondary | ICD-10-CM | POA: Diagnosis not present

## 2016-04-02 DIAGNOSIS — M25559 Pain in unspecified hip: Secondary | ICD-10-CM | POA: Diagnosis not present

## 2016-04-02 DIAGNOSIS — M25562 Pain in left knee: Secondary | ICD-10-CM | POA: Diagnosis not present

## 2016-04-02 DIAGNOSIS — M545 Low back pain: Secondary | ICD-10-CM | POA: Diagnosis not present

## 2016-04-10 DIAGNOSIS — N281 Cyst of kidney, acquired: Secondary | ICD-10-CM | POA: Diagnosis not present

## 2016-04-14 ENCOUNTER — Other Ambulatory Visit: Payer: Self-pay | Admitting: Family Medicine

## 2016-04-30 DIAGNOSIS — G894 Chronic pain syndrome: Secondary | ICD-10-CM | POA: Diagnosis not present

## 2016-04-30 DIAGNOSIS — M25559 Pain in unspecified hip: Secondary | ICD-10-CM | POA: Diagnosis not present

## 2016-04-30 DIAGNOSIS — M545 Low back pain: Secondary | ICD-10-CM | POA: Diagnosis not present

## 2016-04-30 DIAGNOSIS — M25562 Pain in left knee: Secondary | ICD-10-CM | POA: Diagnosis not present

## 2016-05-23 ENCOUNTER — Other Ambulatory Visit: Payer: Self-pay | Admitting: Family Medicine

## 2016-05-28 DIAGNOSIS — M545 Low back pain: Secondary | ICD-10-CM | POA: Diagnosis not present

## 2016-05-28 DIAGNOSIS — M25562 Pain in left knee: Secondary | ICD-10-CM | POA: Diagnosis not present

## 2016-05-28 DIAGNOSIS — M25559 Pain in unspecified hip: Secondary | ICD-10-CM | POA: Diagnosis not present

## 2016-05-28 DIAGNOSIS — G894 Chronic pain syndrome: Secondary | ICD-10-CM | POA: Diagnosis not present

## 2016-06-04 ENCOUNTER — Encounter: Payer: Self-pay | Admitting: Family Medicine

## 2016-06-04 ENCOUNTER — Ambulatory Visit (INDEPENDENT_AMBULATORY_CARE_PROVIDER_SITE_OTHER): Payer: Medicare Other | Admitting: Family Medicine

## 2016-06-04 VITALS — BP 124/78 | HR 58 | Temp 98.3°F | Ht 72.0 in | Wt 220.4 lb

## 2016-06-04 DIAGNOSIS — G894 Chronic pain syndrome: Secondary | ICD-10-CM

## 2016-06-04 DIAGNOSIS — N2889 Other specified disorders of kidney and ureter: Secondary | ICD-10-CM | POA: Diagnosis not present

## 2016-06-04 DIAGNOSIS — I10 Essential (primary) hypertension: Secondary | ICD-10-CM

## 2016-06-04 DIAGNOSIS — K219 Gastro-esophageal reflux disease without esophagitis: Secondary | ICD-10-CM | POA: Diagnosis not present

## 2016-06-04 MED ORDER — ESOMEPRAZOLE MAGNESIUM 40 MG PO CPDR: 40.0000 mg | DELAYED_RELEASE_CAPSULE | Freq: Every day | ORAL | 3 refills | Status: DC

## 2016-06-04 MED ORDER — AMLODIPINE BESYLATE 10 MG PO TABS: 10.0000 mg | ORAL_TABLET | Freq: Every day | ORAL | 3 refills | Status: DC

## 2016-06-04 MED ORDER — QUINAPRIL HCL 20 MG PO TABS: 20.0000 mg | ORAL_TABLET | Freq: Two times a day (BID) | ORAL | 3 refills | Status: DC

## 2016-06-04 MED ORDER — TAMSULOSIN HCL 0.4 MG PO CAPS: 0.4000 mg | ORAL_CAPSULE | Freq: Every day | ORAL | 3 refills | Status: DC

## 2016-06-04 MED ORDER — ATENOLOL 50 MG PO TABS: 50.0000 mg | ORAL_TABLET | Freq: Every day | ORAL | 3 refills | Status: DC

## 2016-06-04 MED ORDER — CLONIDINE HCL 0.1 MG PO TABS: 0.1000 mg | ORAL_TABLET | Freq: Two times a day (BID) | ORAL | 3 refills | Status: DC

## 2016-06-04 MED ORDER — PRAVASTATIN SODIUM 20 MG PO TABS: 20.0000 mg | ORAL_TABLET | Freq: Every day | ORAL | 3 refills | Status: DC

## 2016-06-04 MED ORDER — HYDROCHLOROTHIAZIDE 25 MG PO TABS: ORAL_TABLET | ORAL | 3 refills | Status: DC

## 2016-06-04 NOTE — Patient Instructions (Signed)
I have sent in refills for all of your medicines I am getting blood work today to look at your kidney function I will send you a note in the mail I would like to see you in 6 months or so Great to see you!

## 2016-06-05 LAB — BASIC METABOLIC PANEL
BUN / CREAT RATIO: 15 (ref 10–24)
BUN: 31 mg/dL — ABNORMAL HIGH (ref 8–27)
CHLORIDE: 96 mmol/L (ref 96–106)
CO2: 27 mmol/L (ref 18–29)
Calcium: 9.4 mg/dL (ref 8.6–10.2)
Creatinine, Ser: 2.07 mg/dL — ABNORMAL HIGH (ref 0.76–1.27)
GFR calc Af Amer: 37 mL/min/{1.73_m2} — ABNORMAL LOW (ref >59)
GFR calc non Af Amer: 32 mL/min/{1.73_m2} — ABNORMAL LOW (ref >59)
GLUCOSE: 108 mg/dL — AB (ref 65–99)
Potassium: 3.8 mmol/L (ref 3.5–5.2)
SODIUM: 139 mmol/L (ref 134–144)

## 2016-06-06 NOTE — Progress Notes (Signed)
    CHIEF COMPLAINT / HPI:  #1. Follow-up hypertension: Taking this medicine regularly. No problems medication side effects. Denies chest pain, denies shortness of breath. No lower extremity swelling. Energy levels baseline #2 renal tumor: Being followed by urology. He has a lot of questions about whether or not he should continue to follow-up with them. Most recently they told him they could start performing ultrasounds were then MRIs which would help him financially. 3. Was able to get or 2 into the pain clinic. He's had improvement in his hip pain. He is quite happy with that situation.  REVIEW OF SYSTEMS:  Review of Systems  Constitutional: Negative for activity chang; no  appetite change and no unexpected weight change.  Eyes: Negative for eye pain and no visual disturbance.  Neck: denies neck pain; no swallowing problems CV: No chest pain, no shortness of breath, no lower extremity edema. No change in exercise tolerance Respiratory: Negative for cough or wheezing.  No shortness of breath. Gastrointestinal: Negative for abdominal pain, no diarrhea and no  constipation.  Genitourinary: Negative for decreased urine volume and  no difficulty urinating.  Musculoskeletal: Negative for arthralgias. No muscle weakness. Skin: Negative for rash.  Psychiatric/Behavioral: Negative for behavioral problems; no sleep disturbance and no  agitation.     OBJECTIVE:  Vital signs are reviewed.   Vital signs reviewed. GENERAL: Well-developed, well-nourished, no acute distress. CARDIOVASCULAR: Regular rate and rhythm no murmur gallop or rub LUNGS: Clear to auscultation bilaterally, no rales or wheeze. ABDOMEN: Soft positive bowel sounds NEURO: No gross focal neurological deficits. MSK: Movement of extremity x 4. PSYCHIATRIC: Alert and oriented 4. Affects interactive. Asks and answers questions properly. Speech is normal fluency in content. Memory both recent and remote intact. Judgment  intact.    ASSESSMENT / PLAN: Please see problem oriented charting for details

## 2016-06-06 NOTE — Assessment & Plan Note (Signed)
Long discussion. I think he should continue to follow-up with urology. After we talked about this extensively he agreed to at least follow-up with them for the next year. I think his main concern is financial, co-pays with the MRI. Now that they have decided to follow him with ultrasound: Reps this will be less of an issue. Also explained the importance of having specialist follow this.

## 2016-06-06 NOTE — Assessment & Plan Note (Signed)
Refilled his Nexium.

## 2016-06-12 ENCOUNTER — Encounter: Payer: Self-pay | Admitting: Family Medicine

## 2016-06-20 DIAGNOSIS — M791 Myalgia: Secondary | ICD-10-CM | POA: Diagnosis not present

## 2016-06-27 DIAGNOSIS — G894 Chronic pain syndrome: Secondary | ICD-10-CM | POA: Diagnosis not present

## 2016-06-27 DIAGNOSIS — M25559 Pain in unspecified hip: Secondary | ICD-10-CM | POA: Diagnosis not present

## 2016-06-27 DIAGNOSIS — M25562 Pain in left knee: Secondary | ICD-10-CM | POA: Diagnosis not present

## 2016-06-27 DIAGNOSIS — M545 Low back pain: Secondary | ICD-10-CM | POA: Diagnosis not present

## 2016-07-24 DIAGNOSIS — M545 Low back pain: Secondary | ICD-10-CM | POA: Diagnosis not present

## 2016-07-24 DIAGNOSIS — M25559 Pain in unspecified hip: Secondary | ICD-10-CM | POA: Diagnosis not present

## 2016-07-24 DIAGNOSIS — G894 Chronic pain syndrome: Secondary | ICD-10-CM | POA: Diagnosis not present

## 2016-07-24 DIAGNOSIS — M25562 Pain in left knee: Secondary | ICD-10-CM | POA: Diagnosis not present

## 2016-08-22 DIAGNOSIS — M25562 Pain in left knee: Secondary | ICD-10-CM | POA: Diagnosis not present

## 2016-08-22 DIAGNOSIS — G894 Chronic pain syndrome: Secondary | ICD-10-CM | POA: Diagnosis not present

## 2016-08-22 DIAGNOSIS — M25559 Pain in unspecified hip: Secondary | ICD-10-CM | POA: Diagnosis not present

## 2016-08-22 DIAGNOSIS — M545 Low back pain: Secondary | ICD-10-CM | POA: Diagnosis not present

## 2016-08-26 DIAGNOSIS — G894 Chronic pain syndrome: Secondary | ICD-10-CM | POA: Diagnosis not present

## 2016-08-26 DIAGNOSIS — M545 Low back pain: Secondary | ICD-10-CM | POA: Diagnosis not present

## 2016-08-26 DIAGNOSIS — Z79891 Long term (current) use of opiate analgesic: Secondary | ICD-10-CM | POA: Diagnosis not present

## 2016-08-26 DIAGNOSIS — M25559 Pain in unspecified hip: Secondary | ICD-10-CM | POA: Diagnosis not present

## 2016-09-02 DIAGNOSIS — N281 Cyst of kidney, acquired: Secondary | ICD-10-CM | POA: Diagnosis not present

## 2016-09-19 DIAGNOSIS — M545 Low back pain: Secondary | ICD-10-CM | POA: Diagnosis not present

## 2016-09-19 DIAGNOSIS — G894 Chronic pain syndrome: Secondary | ICD-10-CM | POA: Diagnosis not present

## 2016-09-19 DIAGNOSIS — Z79891 Long term (current) use of opiate analgesic: Secondary | ICD-10-CM | POA: Diagnosis not present

## 2016-10-17 DIAGNOSIS — Z79891 Long term (current) use of opiate analgesic: Secondary | ICD-10-CM | POA: Diagnosis not present

## 2016-10-17 DIAGNOSIS — M25559 Pain in unspecified hip: Secondary | ICD-10-CM | POA: Diagnosis not present

## 2016-10-17 DIAGNOSIS — G894 Chronic pain syndrome: Secondary | ICD-10-CM | POA: Diagnosis not present

## 2016-10-17 DIAGNOSIS — M545 Low back pain: Secondary | ICD-10-CM | POA: Diagnosis not present

## 2016-11-17 DIAGNOSIS — M25559 Pain in unspecified hip: Secondary | ICD-10-CM | POA: Diagnosis not present

## 2016-11-17 DIAGNOSIS — G894 Chronic pain syndrome: Secondary | ICD-10-CM | POA: Diagnosis not present

## 2016-11-17 DIAGNOSIS — Z79891 Long term (current) use of opiate analgesic: Secondary | ICD-10-CM | POA: Diagnosis not present

## 2016-11-17 DIAGNOSIS — M545 Low back pain: Secondary | ICD-10-CM | POA: Diagnosis not present

## 2016-12-03 ENCOUNTER — Encounter: Payer: Self-pay | Admitting: Family Medicine

## 2016-12-03 ENCOUNTER — Ambulatory Visit (INDEPENDENT_AMBULATORY_CARE_PROVIDER_SITE_OTHER): Payer: Medicare Other | Admitting: Family Medicine

## 2016-12-03 VITALS — BP 130/66 | HR 64 | Temp 98.5°F | Ht 72.0 in | Wt 219.0 lb

## 2016-12-03 DIAGNOSIS — N184 Chronic kidney disease, stage 4 (severe): Secondary | ICD-10-CM | POA: Diagnosis not present

## 2016-12-03 DIAGNOSIS — I10 Essential (primary) hypertension: Secondary | ICD-10-CM | POA: Diagnosis not present

## 2016-12-03 DIAGNOSIS — R1032 Left lower quadrant pain: Secondary | ICD-10-CM

## 2016-12-03 DIAGNOSIS — Z125 Encounter for screening for malignant neoplasm of prostate: Secondary | ICD-10-CM | POA: Diagnosis not present

## 2016-12-03 DIAGNOSIS — Z23 Encounter for immunization: Secondary | ICD-10-CM

## 2016-12-03 DIAGNOSIS — N2889 Other specified disorders of kidney and ureter: Secondary | ICD-10-CM | POA: Diagnosis not present

## 2016-12-04 LAB — BASIC METABOLIC PANEL
BUN/Creatinine Ratio: 15 (ref 10–24)
BUN: 29 mg/dL — ABNORMAL HIGH (ref 8–27)
CO2: 25 mmol/L (ref 20–29)
CREATININE: 1.96 mg/dL — AB (ref 0.76–1.27)
Calcium: 9.4 mg/dL (ref 8.6–10.2)
Chloride: 101 mmol/L (ref 96–106)
GFR, EST AFRICAN AMERICAN: 39 mL/min/{1.73_m2} — AB (ref >59)
GFR, EST NON AFRICAN AMERICAN: 34 mL/min/{1.73_m2} — AB (ref >59)
Glucose: 104 mg/dL — ABNORMAL HIGH (ref 65–99)
Potassium: 3.6 mmol/L (ref 3.5–5.2)
SODIUM: 142 mmol/L (ref 134–144)

## 2016-12-04 LAB — PSA: Prostate Specific Ag, Serum: 0.6 ng/mL (ref 0.0–4.0)

## 2016-12-05 NOTE — Progress Notes (Signed)
    CHIEF COMPLAINT / HPI:  LLQ/pelvic pain. Feels like his hernia is getting worse and now he is having abdominal wall discomfort that is below umbilicus, lateral to umbilicus, very intermittent. Feels like a "catch"  REVIEW OF SYSTEMS:  No change in bowel habits or stool caliber. Normal urination. No nausea. No fever. Appetite OK. No fever.  PERTINENT  PMH / PSH: I have reviewed the patient's medications, allergies, past medical and surgical history, smoking status and updated in the EMR as appropriate.   OBJECTIVE: Vital signs reviewed. GENERAL: Well-developed, well-nourished, no acute distress. CARDIOVASCULAR: Regular rate and rhythm no murmur gallop or rub LUNGS: Clear to auscultation bilaterally, no rales or wheeze. ABDOMEN: Soft positive bowel sounds. No TTP. N masses. No abdominal wall defect or bulge with valsalva. NEURO: No gross focal neurological deficits. MSK: Movement of extremity x 4.    ASSESSMENT / PLAN:  Abdominal wall / pelvic pain. Known prior inguinal hernia and known prior dx of renal mass (being followed by urology). I would like to image the pelvis and abdominal not clear on what type of imaging is most appropriate given his CKD4. Will discuss with radiology and then order. I will call him back with this appointment. Check labs. F/u with me in 2-3 weeks.

## 2016-12-08 ENCOUNTER — Encounter: Payer: Self-pay | Admitting: Family Medicine

## 2016-12-08 ENCOUNTER — Other Ambulatory Visit: Payer: Self-pay | Admitting: Family Medicine

## 2016-12-08 DIAGNOSIS — N5082 Scrotal pain: Secondary | ICD-10-CM

## 2016-12-08 DIAGNOSIS — R1032 Left lower quadrant pain: Secondary | ICD-10-CM

## 2016-12-08 DIAGNOSIS — N184 Chronic kidney disease, stage 4 (severe): Secondary | ICD-10-CM

## 2016-12-08 DIAGNOSIS — N2889 Other specified disorders of kidney and ureter: Secondary | ICD-10-CM

## 2016-12-08 DIAGNOSIS — N281 Cyst of kidney, acquired: Secondary | ICD-10-CM

## 2016-12-08 NOTE — Progress Notes (Signed)
Dear Edward Morgan Team Can you please set up his MRI as below [facility]! [name]

## 2016-12-09 NOTE — Progress Notes (Signed)
LVM for pt to call the office. If pt calls, please let him know his MRI is scheduled for 12/24/2016 @ 10:50 am. He will need to [address]  Ottis Stain, CMA

## 2016-12-10 NOTE — Progress Notes (Signed)
LVM for pt to call office back to inform him of the appointment for his MRI. Katharina Caper, Burnett Lieber D, [name]

## 2016-12-19 DIAGNOSIS — M545 Low back pain: Secondary | ICD-10-CM | POA: Diagnosis not present

## 2016-12-19 DIAGNOSIS — Z79891 Long term (current) use of opiate analgesic: Secondary | ICD-10-CM | POA: Diagnosis not present

## 2016-12-19 DIAGNOSIS — G894 Chronic pain syndrome: Secondary | ICD-10-CM | POA: Diagnosis not present

## 2016-12-19 DIAGNOSIS — M25559 Pain in unspecified hip: Secondary | ICD-10-CM | POA: Diagnosis not present

## 2016-12-24 ENCOUNTER — Ambulatory Visit
Admission: RE | Admit: 2016-12-24 | Discharge: 2016-12-24 | Disposition: A | Discharge status: Home / Self Care | Payer: Medicare Other | Source: Ambulatory Visit | Attending: Family Medicine | Admitting: Family Medicine

## 2016-12-24 DIAGNOSIS — N281 Cyst of kidney, acquired: Secondary | ICD-10-CM

## 2016-12-24 DIAGNOSIS — K4020 Bilateral inguinal hernia, without obstruction or gangrene, not specified as recurrent: Secondary | ICD-10-CM | POA: Diagnosis not present

## 2016-12-24 DIAGNOSIS — N5082 Scrotal pain: Secondary | ICD-10-CM

## 2016-12-24 MED ORDER — GADOBENATE DIMEGLUMINE 529 MG/ML IV SOLN
10.0000 mL | Freq: Once | INTRAVENOUS | Status: AC | PRN
  Administered 2016-12-24: 10 mL via INTRAVENOUS

## 2016-12-31 ENCOUNTER — Telehealth: Payer: Self-pay | Admitting: Family Medicine

## 2016-12-31 ENCOUNTER — Encounter: Payer: Self-pay | Admitting: Family Medicine

## 2016-12-31 NOTE — Telephone Encounter (Signed)
Pt informed of below and asked what PCP was going to do about it now. Katharina Caper, Mortimer Bair D, [name]

## 2016-12-31 NOTE — Telephone Encounter (Signed)
Dear Dema Severin Team Please let Mr Wight know the MRi did NOT show a new hernia. I am sending him a letter in the mail wit the detials If he has any questions, let me know and I will call him THANKS! [name]

## 2017-01-06 NOTE — Telephone Encounter (Signed)
Contacted pt and gave him the below information and he said that he wanted to hold off cause he didn't think that it is his colon. He said it hurts in the front. Told him I would route to PCP as an FYI.  Katharina Caper, April D, [place] \

## 2017-01-06 NOTE — Telephone Encounter (Signed)
Dear Dema Severin Team The next step is colonoscopy Does he want me to set that up? [name]

## 2017-01-22 DIAGNOSIS — Z79891 Long term (current) use of opiate analgesic: Secondary | ICD-10-CM | POA: Diagnosis not present

## 2017-01-22 DIAGNOSIS — M25559 Pain in unspecified hip: Secondary | ICD-10-CM | POA: Diagnosis not present

## 2017-01-22 DIAGNOSIS — M545 Low back pain: Secondary | ICD-10-CM | POA: Diagnosis not present

## 2017-01-22 DIAGNOSIS — G894 Chronic pain syndrome: Secondary | ICD-10-CM | POA: Diagnosis not present

## 2017-03-02 DIAGNOSIS — Z79891 Long term (current) use of opiate analgesic: Secondary | ICD-10-CM | POA: Diagnosis not present

## 2017-03-02 DIAGNOSIS — G894 Chronic pain syndrome: Secondary | ICD-10-CM | POA: Diagnosis not present

## 2017-03-02 DIAGNOSIS — M545 Low back pain: Secondary | ICD-10-CM | POA: Diagnosis not present

## 2017-03-02 DIAGNOSIS — M25559 Pain in unspecified hip: Secondary | ICD-10-CM | POA: Diagnosis not present

## 2017-03-22 DIAGNOSIS — M791 Myalgia, unspecified site: Secondary | ICD-10-CM | POA: Diagnosis not present

## 2017-04-01 DIAGNOSIS — M545 Low back pain: Secondary | ICD-10-CM | POA: Diagnosis not present

## 2017-04-01 DIAGNOSIS — G894 Chronic pain syndrome: Secondary | ICD-10-CM | POA: Diagnosis not present

## 2017-04-01 DIAGNOSIS — Z79891 Long term (current) use of opiate analgesic: Secondary | ICD-10-CM | POA: Diagnosis not present

## 2017-04-01 DIAGNOSIS — M25559 Pain in unspecified hip: Secondary | ICD-10-CM | POA: Diagnosis not present

## 2017-05-27 ENCOUNTER — Other Ambulatory Visit: Payer: Self-pay | Admitting: Family Medicine

## 2017-06-10 ENCOUNTER — Ambulatory Visit: Payer: Medicare Other | Admitting: Family Medicine

## 2017-06-27 ENCOUNTER — Other Ambulatory Visit: Payer: Self-pay | Admitting: Family Medicine

## 2017-07-01 ENCOUNTER — Encounter: Payer: Self-pay | Admitting: Family Medicine

## 2017-07-01 ENCOUNTER — Ambulatory Visit: Payer: Medicare Other | Admitting: Family Medicine

## 2017-07-01 ENCOUNTER — Other Ambulatory Visit: Payer: Self-pay

## 2017-07-01 ENCOUNTER — Ambulatory Visit (INDEPENDENT_AMBULATORY_CARE_PROVIDER_SITE_OTHER): Payer: Medicare Other | Admitting: Family Medicine

## 2017-07-01 VITALS — BP 144/78 | HR 61 | Temp 98.3°F | Ht 72.0 in | Wt 213.2 lb

## 2017-07-01 DIAGNOSIS — I10 Essential (primary) hypertension: Secondary | ICD-10-CM | POA: Diagnosis not present

## 2017-07-01 DIAGNOSIS — G894 Chronic pain syndrome: Secondary | ICD-10-CM | POA: Diagnosis not present

## 2017-07-01 DIAGNOSIS — N184 Chronic kidney disease, stage 4 (severe): Secondary | ICD-10-CM | POA: Diagnosis not present

## 2017-07-02 ENCOUNTER — Telehealth: Payer: Self-pay

## 2017-07-02 LAB — CMP14+EGFR
ALBUMIN: 4.1 g/dL (ref 3.6–4.8)
ALK PHOS: 80 IU/L (ref 39–117)
ALT: 11 IU/L (ref 0–44)
AST: 15 IU/L (ref 0–40)
Albumin/Globulin Ratio: 1.7 (ref 1.2–2.2)
BUN / CREAT RATIO: 14 (ref 10–24)
BUN: 26 mg/dL (ref 8–27)
Bilirubin Total: 0.3 mg/dL (ref 0.0–1.2)
CALCIUM: 9.1 mg/dL (ref 8.6–10.2)
CO2: 25 mmol/L (ref 20–29)
Chloride: 99 mmol/L (ref 96–106)
Creatinine, Ser: 1.81 mg/dL — ABNORMAL HIGH (ref 0.76–1.27)
GFR calc Af Amer: 43 mL/min/{1.73_m2} — ABNORMAL LOW (ref >59)
GFR, EST NON AFRICAN AMERICAN: 37 mL/min/{1.73_m2} — AB (ref >59)
GLOBULIN, TOTAL: 2.4 g/dL (ref 1.5–4.5)
Glucose: 110 mg/dL — ABNORMAL HIGH (ref 65–99)
Potassium: 3.9 mmol/L (ref 3.5–5.2)
SODIUM: 141 mmol/L (ref 134–144)
Total Protein: 6.5 g/dL (ref 6.0–8.5)

## 2017-07-02 LAB — LDL CHOLESTEROL, DIRECT: LDL Direct: 124 mg/dL — ABNORMAL HIGH (ref 0–99)

## 2017-07-02 NOTE — Telephone Encounter (Signed)
Pt left message on nurse line asking for a referral to "any pain clinic that will take him." Pt call back [phone] Wallace Cullens, RN

## 2017-07-03 ENCOUNTER — Encounter: Payer: Self-pay | Admitting: Family Medicine

## 2017-07-03 NOTE — Progress Notes (Signed)
    CHIEF COMPLAINT / HPI: #1.  Follow-up hypertension: Says he has not taken his medicine yet this morning but if takes it regularly.  Denies chest pain.  No shortness of breath.  No lower extremity edema.  No headache. 2.  Chronic pain: He was another referral to pain clinic.  He was going to a pain clinic and had good results but for some reason he stopped.  Tramadol was not seeming to help him.  REVIEW OF SYSTEMS: See HPI  PERTINENT  PMH / PSH: I have reviewed the patient's medications, allergies, past medical and surgical history, smoking status and updated in the EMR as appropriate.   OBJECTIVE:  Vital signs reviewed. GENERAL: Well-developed, well-nourished, no acute distress. CARDIOVASCULAR: Regular rate and rhythm no murmur gallop or rub LUNGS: Clear to auscultation bilaterally, no rales or wheeze. ABDOMEN: Soft positive bowel sounds NEURO: No gross focal neurological deficits. MSK: Movement of extremity x 4. Psych: Alert and oriented x4.  Affect is interactive.  No psychomotor agitation or retardation.  Speech is normal in fluency and content.  He answers questions appropriately.  Recent and remote memory is intact.   ASSESSMENT / PLAN: Please see problem oriented charting for details

## 2017-07-03 NOTE — Assessment & Plan Note (Signed)
Will check creatinine today. 

## 2017-07-03 NOTE — Assessment & Plan Note (Signed)
I have referred him to the pain clinic of his choice.

## 2017-07-03 NOTE — Assessment & Plan Note (Signed)
I suspect he is got pretty good control.  Unclear why he did not take his medicine this morning.  I would like him to take some home blood pressure readings and send those to me.  We will continue the current medications.  We will get some blood work.  Follow-up hypertension in 3 to 6 months.  If I find that his blood pressures are elevated when he send those to me, I will contact him and have him come in sooner.

## 2017-07-06 ENCOUNTER — Encounter: Payer: Self-pay | Admitting: Family Medicine

## 2017-07-06 NOTE — Telephone Encounter (Signed)
Kennyth Lose I sent you a referral for this---he is now wanting any clinic---not specific. I am not sure any will take him but thought you should know. THANKS! [name]

## 2017-07-31 ENCOUNTER — Other Ambulatory Visit: Payer: Self-pay | Admitting: Family Medicine

## 2017-08-25 ENCOUNTER — Other Ambulatory Visit: Payer: Self-pay | Admitting: Family Medicine

## 2017-09-01 DIAGNOSIS — N281 Cyst of kidney, acquired: Secondary | ICD-10-CM | POA: Diagnosis not present

## 2017-09-21 DIAGNOSIS — Z79891 Long term (current) use of opiate analgesic: Secondary | ICD-10-CM | POA: Diagnosis not present

## 2017-09-28 DIAGNOSIS — Z79891 Long term (current) use of opiate analgesic: Secondary | ICD-10-CM | POA: Diagnosis not present

## 2017-10-09 DIAGNOSIS — Z79891 Long term (current) use of opiate analgesic: Secondary | ICD-10-CM | POA: Diagnosis not present

## 2017-10-16 DIAGNOSIS — Z79891 Long term (current) use of opiate analgesic: Secondary | ICD-10-CM | POA: Diagnosis not present

## 2017-10-22 ENCOUNTER — Other Ambulatory Visit: Payer: Self-pay

## 2017-10-22 ENCOUNTER — Ambulatory Visit (INDEPENDENT_AMBULATORY_CARE_PROVIDER_SITE_OTHER): Payer: Medicare Other | Admitting: Family Medicine

## 2017-10-22 VITALS — BP 155/80 | HR 70 | Temp 98.6°F | Wt 210.0 lb

## 2017-10-22 DIAGNOSIS — Z79891 Long term (current) use of opiate analgesic: Secondary | ICD-10-CM | POA: Diagnosis not present

## 2017-10-22 DIAGNOSIS — M792 Neuralgia and neuritis, unspecified: Secondary | ICD-10-CM | POA: Diagnosis not present

## 2017-10-22 DIAGNOSIS — K4090 Unilateral inguinal hernia, without obstruction or gangrene, not specified as recurrent: Secondary | ICD-10-CM | POA: Insufficient documentation

## 2017-10-22 DIAGNOSIS — N4340 Spermatocele of epididymis, unspecified: Secondary | ICD-10-CM | POA: Insufficient documentation

## 2017-10-22 MED ORDER — DICLOFENAC SODIUM 1 % TD GEL: 4.0000 g | Freq: Four times a day (QID) | TRANSDERMAL | 0 refills | Status: DC

## 2017-10-22 NOTE — Patient Instructions (Signed)
It was great seeing you today! I am sorry that you have been having so much trouble with your groin pain. I will be giving you some voltaren gel, which will help with this burning sensation. I would have your urologist take at look at that cyst. I think this could be causing some of the burning sensation you are feeling.

## 2017-10-22 NOTE — Assessment & Plan Note (Signed)
Patient with burning pain consistent with neuropathic pain.  Unclear if known spermatocele is impinging on an inguinal nerve, or if patient is having pain from stretch due to this.  Given patient's history of opioid abuse, and his known kidney disease narcotics and NSAIDs contraindicated.  We will treat with Voltaren gel.  I recommend patient has spermatocele evaluated by urologist.

## 2017-10-22 NOTE — Assessment & Plan Note (Signed)
Patient with known inguinal hernia, first seen on MRI October 2018.  Easily reducible.  Appears to have drawn some according to patient.  Not causing any problems more of an incidental finding.  Do not believe surgical evaluation needed At this point

## 2017-10-22 NOTE — Assessment & Plan Note (Signed)
Known 6 x 8 cm epididymal cyst.  By definition spermatocele.  Highly recommend this is evaluated by patient's urologist.  Non-emergent.  Patient feels like this has a grown recently, so might measure larger.

## 2017-10-22 NOTE — Progress Notes (Signed)
   HPI  67 year old male who presents for left groin pain.  States his pain is been going off and on for several years.  Has recently been much worse over the last 3 weeks.  The pain is described as a burning sensation in his left groin.  He has tried nothing to relieve the pain.  As far as he knows activity does not make the pain worse.   Patient has a known epididymal cyst and left testis.  He feels like this is been slowly growing.  First noticed back in 1996.  He feels like this is increased in size over the last few weeks.  It is unclear if he has ever had this evaluated by his urologist.  He is having no burning with urination.  He is having no problems with constipation.   CC: Left groin pain   ROS:   Review of Systems See HPI for ROS.   CC, SH/smoking status, and VS noted  Objective: BP (!) 155/80   Pulse 70   Temp 98.6 F (37 C) (Oral)   Wt 210 lb (95.3 kg)   SpO2 98%   BMI 28.48 kg/m  Gen: NAD, alert, cooperative, and pleasant.  Elderly African-American male, sitting comfortably in bed. CV: RRR, no murmur Resp: CTAB, no wheezes, non-labored Abd: SNTND, BS present, no guarding or organomegaly GU: Small amount of edema in scrotum.  Left-sided inguinal hernia easily reducible.  Raised area within scrotum consistent with known spermatocele.  Soft to touch likely fluid-filled.   Assessment and plan:  Neuralgia Patient with burning pain consistent with neuropathic pain.  Unclear if known spermatocele is impinging on an inguinal nerve, or if patient is having pain from stretch due to this.  Given patient's history of opioid abuse, and his known kidney disease narcotics and NSAIDs contraindicated.  We will treat with Voltaren gel.  I recommend patient has spermatocele evaluated by urologist.   Spermatocele of epididymis Known 6 x 8 cm epididymal cyst.  By definition spermatocele.  Highly recommend this is evaluated by patient's urologist.  Non-emergent.  Patient feels like  this has a grown recently, so might measure larger.  Left inguinal hernia Patient with known inguinal hernia, first seen on MRI October 2018.  Easily reducible.  Appears to have drawn some according to patient.  Not causing any problems more of an incidental finding.  Do not believe surgical evaluation needed At this point   No orders of the defined types were placed in this encounter.   Meds ordered this encounter  Medications  . diclofenac sodium (VOLTAREN) 1 % GEL    Sig: Apply 4 g topically 4 (four) times daily.    Dispense:  100 g    Refill:  0    Guadalupe Dawn MD PGY-2 Family Medicine Resident  10/22/2017 5:21 PM

## 2017-10-27 DIAGNOSIS — Z79891 Long term (current) use of opiate analgesic: Secondary | ICD-10-CM | POA: Diagnosis not present

## 2017-11-16 DIAGNOSIS — Z79891 Long term (current) use of opiate analgesic: Secondary | ICD-10-CM | POA: Diagnosis not present

## 2017-11-25 DIAGNOSIS — Z79891 Long term (current) use of opiate analgesic: Secondary | ICD-10-CM | POA: Diagnosis not present

## 2017-11-25 DIAGNOSIS — Z5181 Encounter for therapeutic drug level monitoring: Secondary | ICD-10-CM | POA: Diagnosis not present

## 2017-12-21 ENCOUNTER — Encounter: Payer: Self-pay | Admitting: Gastroenterology

## 2017-12-22 DIAGNOSIS — Z79891 Long term (current) use of opiate analgesic: Secondary | ICD-10-CM | POA: Diagnosis not present

## 2017-12-27 ENCOUNTER — Other Ambulatory Visit: Payer: Self-pay | Admitting: Family Medicine

## 2017-12-29 ENCOUNTER — Telehealth: Payer: Self-pay | Admitting: Family Medicine

## 2017-12-29 NOTE — Telephone Encounter (Signed)
Attempted to contact pt to remind them of their upcoming appt tomorrow on 12/30/2017. -[place]

## 2017-12-30 ENCOUNTER — Other Ambulatory Visit: Payer: Self-pay

## 2017-12-30 ENCOUNTER — Encounter: Payer: Self-pay | Admitting: Family Medicine

## 2017-12-30 ENCOUNTER — Other Ambulatory Visit: Payer: Self-pay | Admitting: Family Medicine

## 2017-12-30 ENCOUNTER — Ambulatory Visit (INDEPENDENT_AMBULATORY_CARE_PROVIDER_SITE_OTHER): Payer: Medicare Other | Admitting: Family Medicine

## 2017-12-30 VITALS — BP 148/82 | HR 58 | Temp 98.3°F | Ht 72.0 in | Wt 190.8 lb

## 2017-12-30 DIAGNOSIS — Z23 Encounter for immunization: Secondary | ICD-10-CM

## 2017-12-30 DIAGNOSIS — N4340 Spermatocele of epididymis, unspecified: Secondary | ICD-10-CM

## 2017-12-30 DIAGNOSIS — F1910 Other psychoactive substance abuse, uncomplicated: Secondary | ICD-10-CM

## 2017-12-30 DIAGNOSIS — Z299 Encounter for prophylactic measures, unspecified: Secondary | ICD-10-CM

## 2017-12-30 DIAGNOSIS — M21371 Foot drop, right foot: Secondary | ICD-10-CM

## 2017-12-30 DIAGNOSIS — I10 Essential (primary) hypertension: Secondary | ICD-10-CM

## 2017-12-30 MED ORDER — AMLODIPINE BESYLATE 10 MG PO TABS
10.0000 mg | ORAL_TABLET | Freq: Every day | ORAL | 3 refills | Status: DC
Start: 2017-12-30 — End: 2022-03-11

## 2017-12-30 NOTE — Patient Instructions (Signed)
Please call your urologist, Dr. [name] at [facility] urology and set up an appointment for the swelling in your left groin. Their number is [phone]  Restart you amlodipine. Great to see you!

## 2017-12-31 ENCOUNTER — Encounter: Payer: Self-pay | Admitting: Family Medicine

## 2017-12-31 DIAGNOSIS — Z299 Encounter for prophylactic measures, unspecified: Secondary | ICD-10-CM | POA: Insufficient documentation

## 2017-12-31 DIAGNOSIS — F1910 Other psychoactive substance abuse, uncomplicated: Secondary | ICD-10-CM | POA: Insufficient documentation

## 2017-12-31 NOTE — Assessment & Plan Note (Signed)
>>  ASSESSMENT AND PLAN FOR SUBSTANCE ABUSE (HCC) WRITTEN ON 12/31/2017 10:34 AM BY Marena Witts L, MD  He reports entering a drug rehab program.  I encouraged him in this endeavor.

## 2017-12-31 NOTE — Assessment & Plan Note (Signed)
Not wearing AFO mild

## 2017-12-31 NOTE — Assessment & Plan Note (Signed)
He reports entering a drug rehab program.  I encouraged him in this endeavor.

## 2017-12-31 NOTE — Assessment & Plan Note (Signed)
I gave him the phone number and information to call his urologist.  I will also put in a referral if he needs it but since he was seen within the last year I do not think he does.  He will call back if he needs me to do the referral.

## 2017-12-31 NOTE — Assessment & Plan Note (Signed)
Does not want to schedule colonoscopy at this time

## 2017-12-31 NOTE — Assessment & Plan Note (Signed)
Restart amlodipine

## 2017-12-31 NOTE — Progress Notes (Signed)
    CHIEF COMPLAINT / HPI: He says he is here for checkup and med refills. 1. HTN: He stopped taking his amlodipine because he saw something on TV about it being bad for you.  He has been taking his other medications. #2.  He has evidently entered a drug rehab program.  He says he is going to get off follow-up narcotic and opioid drugs including street drugs. #3.  Groin swelling: Last office visit I told him to follow-up with his urologist but he was not aware that he was 1 and had to call and make that appointment.  He is not having as much pain from the area but is essentially swells at times and other times it goes down.  No problems with urination.  REVIEW OF SYSTEMS: Denies chest pain.  No fever.  No unusual weight change.  Denies confusion.  See HPI.  PERTINENT  PMH / PSH: I have reviewed the patient's medications, allergies, past medical and surgical history, smoking status and updated in the EMR as appropriate.   OBJECTIVE:  Vital signs reviewed GENERALl: Well developed, well nourished, in no acute distress. HEENT: PERRLA, EOMI, sclerae are nonicteric.  Bilateral arcus senilis NECK: Supple, FROM, without lymphadenopathy.  THYROID: normal without nodularity CAROTID ARTERIES: without bruits LUNGS: clear to auscultation bilaterally. No wheezes or rales. Normal respiratory effort HEART: Regular rate and rhythm, no murmurs. Distal pulses are bilaterally symmetrical, 2+. ABDOMEN: soft with positive bowel sounds. No masses noted MSK: MOE x 4. Normal muscle strength, bulk and tone. SKIN no rash. Normal temperature. NEURO: no focal deficits except mild left foot drop.. Normal gait. Normal balance. AxO x4. Good eye contact.. No psychomotor retardation or agitation.Appropriate speech fluency and content.  . Asks and answers questions appropriately. Mood is congruent.   ASSESSMENT / PLAN:  Essential hypertension Restart amlodipine  Spermatocele of epididymis I gave him the phone  number and information to call his urologist.  I will also put in a referral if he needs it but since he was seen within the last year I do not think he does.  He will call back if he needs me to do the referral.  Substance abuse [facility]) He reports entering a drug rehab program.  I encouraged him in this endeavor.    I would recommend he follow-up with me in 3 months and will get labs then.  We will certainly see him in the interim with new or worsening issues.  Also let me know if he has trouble getting appointment with his urologist.

## 2018-02-01 DIAGNOSIS — N281 Cyst of kidney, acquired: Secondary | ICD-10-CM | POA: Diagnosis not present

## 2018-02-01 DIAGNOSIS — K4090 Unilateral inguinal hernia, without obstruction or gangrene, not specified as recurrent: Secondary | ICD-10-CM | POA: Diagnosis not present

## 2018-03-08 ENCOUNTER — Ambulatory Visit: Payer: Self-pay | Admitting: General Surgery

## 2018-03-08 DIAGNOSIS — K4090 Unilateral inguinal hernia, without obstruction or gangrene, not specified as recurrent: Secondary | ICD-10-CM | POA: Diagnosis not present

## 2018-03-08 NOTE — H&P (View-Only) (Signed)
History of Present Illness Edward Morgan; 03/08/2018 9:30 AM) The patient is a 68 year old male who presents with an inguinal hernia. Referred by: Dr. Louis Morgan Chief Complaint: Left inguinal hernia  Patient is a 68 year old male with a history of hypertension, hyperlipidemia, methadone use secondary to drug use, who comes in with a 1-2 year history of left inguinal pain. Patient states that he noticed a bulge which is become more evident over the last couple of months. He states this is associated with pain. Patient states that the hernia reduces on its own he lays down. He states that when he is straining, coughing, lifting he does notice more pain and symptoms from the hernia. Patient states that the pain is fairly sharp we does have.  Patient's had no previous abdominal surgery.  Patient does get his methadone from a counselor. He will get in touch with them in regards to postoperative pain control.    Past Surgical History Edward Morgan, Edward Morgan; 03/08/2018 9:20 AM) No pertinent past surgical history   Allergies Edward Morgan, Edward Morgan; 03/08/2018 9:21 AM) Penicillins  Allergies Reconciled   Medication History Edward Morgan; 03/08/2018 9:22 AM) amLODIPine Besylate (10MG  Tablet, Oral) Active. cloNIDine HCl (0.1MG  Tablet, Oral) Active. Quinapril HCl (20MG  Tablet, Oral) Active. Atenolol (50MG  Tablet, Oral) Active. Esomeprazole Magnesium (40MG  Capsule DR, Oral) Active. hydroCHLOROthiazide (25MG  Tablet, Oral) Active. Pravastatin Sodium (20MG  Tablet, Oral) Active. Tamsulosin HCl (0.4MG  Capsule, Oral) Active. Methadone HCl (40MG  Tablet, Oral) Active. Medications Reconciled  Social History Edward Morgan, Edward Morgan; 03/08/2018 9:20 AM) Tobacco use  Never smoker.    Review of Systems Edward Morgan; 03/08/2018 9:29 AM) General Present- Weight Loss. Not Present- Appetite Loss, Chills, Fatigue, Fever, Night Sweats and Weight Gain. HEENT Present-  Ringing in the Ears and Wears glasses/contact lenses. Not Present- Earache, Hearing Loss, Hoarseness, Nose Bleed, Oral Ulcers, Seasonal Allergies, Sinus Pain, Sore Throat, Visual Disturbances and Yellow Eyes. All other systems negative  Vitals Edward Morgan; 03/08/2018 9:20 AM) 03/08/2018 9:20 AM Weight: 191.6 lb Height: 72in Body Surface Area: 2.09 m Body Mass Index: 25.99 kg/m  Temp.: 97.31F  Pulse: 72 (Regular)  BP: 116/74 (Sitting, Left Arm, Standard)       Physical Exam Edward Morgan; 03/08/2018 9:31 AM)   Constitutional: No acute distress, conversant, appears stated age  Eyes: Anicteric sclerae, moist conjunctiva, no lid lag  Neck: No thyromegaly, trachea midline, no cervical lymphadenopathy  Lungs: Clear to auscultation biilaterally, normal respiratory effot  Cardiovascular: regular rate & rhythm, no murmurs, no peripheal edema, pedal pulses 2+  GI: Soft, no masses or hepatosplenomegaly, non-tender to palpation  MSK: Normal gait, no clubbing cyanosis, edema  Skin: No rashes, palpation reveals normal skin turgor  Psychiatric: Appropriate judgment and insight, oriented to person, place, and time Abdomen Inspection Hernias - Inguinal hernia - Left - Reducible(large).    Assessment & Plan Edward Morgan; 03/08/2018 9:31 AM) LEFT INGUINAL HERNIA (K40.90) Impression: Patient is a 68 year old male with a history of hypertension, hyperlipidemia, methadone use, with a large  1. The patient will like to proceed to the operating room for laparoscopic left inguinal hernia repair with mesh.  2. I discussed with the patient the signs and symptoms of incarceration and strangulation and the need to proceed to the ER should they occur.  3. I discussed with the patient the risks and benefits of the procedure to include but not limited to: Infection, bleeding, damage to surrounding structures, possible need for further surgery, possible nerve pain,  and  possible recurrence. The patient was understanding and wishes to proceed. Left inguinal hernia.

## 2018-03-08 NOTE — H&P (Signed)
History of Present Illness Edward Morgan; 03/08/2018 9:30 AM) The patient is a 68 year old male who presents with an inguinal hernia. Referred by: Dr. Louis Morgan Chief Complaint: Left inguinal hernia  Patient is a 67 year old male with a history of hypertension, hyperlipidemia, methadone use secondary to drug use, who comes in with a 1-2 year history of left inguinal pain. Patient states that he noticed a bulge which is become more evident over the last couple of months. He states this is associated with pain. Patient states that the hernia reduces on its own he lays down. He states that when he is straining, coughing, lifting he does notice more pain and symptoms from the hernia. Patient states that the pain is fairly sharp we does have.  Patient's had no previous abdominal surgery.  Patient does get his methadone from a counselor. He will get in touch with them in regards to postoperative pain control.    Past Surgical History Edward Morgan, [place]; 03/08/2018 9:20 AM) No pertinent past surgical history   Allergies Edward Morgan, [place]; 03/08/2018 9:21 AM) Penicillins  Allergies Reconciled   Medication History Edward Morgan, CMA; 03/08/2018 9:22 AM) amLODIPine Besylate (10MG  Tablet, Oral) Active. cloNIDine HCl (0.1MG  Tablet, Oral) Active. Quinapril HCl (20MG  Tablet, Oral) Active. Atenolol (50MG  Tablet, Oral) Active. Esomeprazole Magnesium (40MG  Capsule DR, Oral) Active. hydroCHLOROthiazide (25MG  Tablet, Oral) Active. Pravastatin Sodium (20MG  Tablet, Oral) Active. Tamsulosin HCl (0.4MG  Capsule, Oral) Active. Methadone HCl (40MG  Tablet, Oral) Active. Medications Reconciled  Social History Edward Morgan, [name]; 03/08/2018 9:20 AM) Tobacco use  Never smoker.    Review of Systems Edward Morgan; 03/08/2018 9:29 AM) General Present- Weight Loss. Not Present- Appetite Loss, Chills, Fatigue, Fever, Night Sweats and Weight Gain. HEENT Present-  Ringing in the Ears and Wears glasses/contact lenses. Not Present- Earache, Hearing Loss, Hoarseness, Nose Bleed, Oral Ulcers, Seasonal Allergies, Sinus Pain, Sore Throat, Visual Disturbances and Yellow Eyes. All other systems negative  Vitals Edward Morgan CMA; 03/08/2018 9:20 AM) 03/08/2018 9:20 AM Weight: 191.6 lb Height: 72in Body Surface Area: 2.09 m Body Mass Index: 25.99 kg/m  Temp.: 97.30F  Pulse: 72 (Regular)  BP: 116/74 (Sitting, Left Arm, Standard)       Physical Exam Edward Morgan; 03/08/2018 9:31 AM)   Constitutional: No acute distress, conversant, appears stated age  Eyes: Anicteric sclerae, moist conjunctiva, no lid lag  Neck: No thyromegaly, trachea midline, no cervical lymphadenopathy  Lungs: Clear to auscultation biilaterally, normal respiratory effot  Cardiovascular: regular rate & rhythm, no murmurs, no peripheal edema, pedal pulses 2+  GI: Soft, no masses or hepatosplenomegaly, non-tender to palpation  MSK: Normal gait, no clubbing cyanosis, edema  Skin: No rashes, palpation reveals normal skin turgor  Psychiatric: Appropriate judgment and insight, oriented to person, place, and time Abdomen Inspection Hernias - Inguinal hernia - Left - Reducible(large).    Assessment & Plan Edward Morgan; 03/08/2018 9:31 AM) LEFT INGUINAL HERNIA (K40.90) Impression: Patient is a 68 year old male with a history of hypertension, hyperlipidemia, methadone use, with a large  1. The patient will like to proceed to the operating room for laparoscopic left inguinal hernia repair with mesh.  2. I discussed with the patient the signs and symptoms of incarceration and strangulation and the need to proceed to the ER should they occur.  3. I discussed with the patient the risks and benefits of the procedure to include but not limited to: Infection, bleeding, damage to surrounding structures, possible need for further surgery, possible nerve pain,  and  possible recurrence. The patient was understanding and wishes to proceed. Left inguinal hernia.

## 2018-03-17 NOTE — Pre-Procedure Instructions (Signed)
Edward Morgan  03/17/2018      [id number] [place], [place] [address] [place] [address] Phone: [phone] Fax: [phone]    Your procedure is scheduled on Wed. Jan. 29, 2020 from 8:30AM-9:45AM  Report to [facility] Admitting Entrance "A" at 6:30AM  Call this number if you have problems the morning of surgery:  [phone]   Remember:  Do not eat or drink after midnight on Jan. 28th    Take these medicines the morning of surgery with A SIP OF WATER: Atenolol (TENORMIN), CloNIDine (CATAPRES), Esomeprazole (NEXIUM), and Tamsulosin (FLOMAX)  As of today, stop taking all Other Aspirin Products, Vitamins, Fish oils, and Herbal medications. Also stop all NSAIDS i.e. Advil, Ibuprofen, Motrin, Aleve, Anaprox, Naproxen, BC, Goody Powders, and all Supplements.    Do not wear jewelry.  Do not wear lotions, powders, colognes, or deodorant.  Do not shave 48 hours prior to surgery.  Men may shave face.  Do not bring valuables to the hospital.  [facility] is not responsible for any belongings or valuables.  Contacts, dentures or bridgework may not be worn into surgery.  Leave your suitcase in the car.  After surgery it may be brought to your room.  For patients admitted to the hospital, discharge time will be determined by your treatment team.  Patients discharged the day of surgery will not be allowed to drive home.   Special instructions: [place]- Preparing For Surgery  Before surgery, you can play an important role. Because skin is not sterile, your skin needs to be as free of germs as possible. You can reduce the number of germs on your skin by washing with CHG (chlorahexidine gluconate) Soap before surgery.  CHG is an antiseptic cleaner which kills germs and bonds with the skin to continue killing germs even after washing.    Oral Hygiene is also important to reduce your risk  of infection.  Remember - BRUSH YOUR TEETH THE MORNING OF SURGERY WITH YOUR REGULAR TOOTHPASTE  Please do not use if you have an allergy to CHG or antibacterial soaps. If your skin becomes reddened/irritated stop using the CHG.  Do not shave (including legs and underarms) for at least 48 hours prior to first CHG shower. It is OK to shave your face.  Please follow these instructions carefully.   1. Shower the NIGHT BEFORE SURGERY and the MORNING OF SURGERY with CHG.   2. If you chose to wash your hair, wash your hair first as usual with your normal shampoo.  3. After you shampoo, rinse your hair and body thoroughly to remove the shampoo.  4. Use CHG as you would any other liquid soap. You can apply CHG directly to the skin and wash gently with a scrungie or a clean washcloth.   5. Apply the CHG Soap to your body ONLY FROM THE NECK DOWN.  Do not use on open wounds or open sores. Avoid contact with your eyes, ears, mouth and genitals (private parts). Wash Face and genitals (private parts)  with your normal soap.  6. Wash thoroughly, paying special attention to the area where your surgery will be performed.  7. Thoroughly rinse your body with warm water from the neck down.  8. DO NOT shower/wash with your normal soap after using and rinsing off the CHG Soap.  9. Pat yourself dry with a CLEAN TOWEL.  10. Wear CLEAN  PAJAMAS to bed the night before surgery, wear comfortable clothes the morning of surgery  11. Place CLEAN SHEETS on your bed the night of your first shower and DO NOT SLEEP WITH PETS.  Day of Surgery:  Do not apply any deodorants/lotions.  Please wear clean clothes to the hospital/surgery center.   Remember to brush your teeth WITH YOUR REGULAR TOOTHPASTE.  Please read over the following fact sheets that you were given. Pain Booklet, Coughing and Deep Breathing and Surgical Site Infection Prevention

## 2018-03-18 ENCOUNTER — Encounter (HOSPITAL_COMMUNITY)
Admission: RE | Admit: 2018-03-18 | Discharge: 2018-03-18 | Disposition: A | Discharge status: Home / Self Care | Payer: Medicare Other | Source: Ambulatory Visit | Attending: General Surgery | Admitting: General Surgery

## 2018-03-18 ENCOUNTER — Other Ambulatory Visit: Payer: Self-pay

## 2018-03-18 ENCOUNTER — Encounter (HOSPITAL_COMMUNITY): Payer: Self-pay

## 2018-03-18 DIAGNOSIS — Z01818 Encounter for other preprocedural examination: Secondary | ICD-10-CM | POA: Insufficient documentation

## 2018-03-18 LAB — COMPREHENSIVE METABOLIC PANEL
ALT: 17 U/L (ref 0–44)
AST: 21 U/L (ref 15–41)
Albumin: 4 g/dL (ref 3.5–5.0)
Alkaline Phosphatase: 59 U/L (ref 38–126)
Anion gap: 9 (ref 5–15)
BUN: 25 mg/dL — ABNORMAL HIGH (ref 8–23)
CO2: 28 mmol/L (ref 22–32)
Calcium: 9.1 mg/dL (ref 8.9–10.3)
Chloride: 99 mmol/L (ref 98–111)
Creatinine, Ser: 2.04 mg/dL — ABNORMAL HIGH (ref 0.61–1.24)
GFR calc Af Amer: 37 mL/min — ABNORMAL LOW (ref >60)
GFR calc non Af Amer: 32 mL/min — ABNORMAL LOW (ref >60)
Glucose, Bld: 86 mg/dL (ref 70–99)
POTASSIUM: 3.5 mmol/L (ref 3.5–5.1)
Sodium: 136 mmol/L (ref 135–145)
TOTAL PROTEIN: 7 g/dL (ref 6.5–8.1)
Total Bilirubin: 0.6 mg/dL (ref 0.3–1.2)

## 2018-03-18 LAB — CBC
HEMATOCRIT: 37.9 % — AB (ref 39.0–52.0)
HEMOGLOBIN: 12.1 g/dL — AB (ref 13.0–17.0)
MCH: 29.4 pg (ref 26.0–34.0)
MCHC: 31.9 g/dL (ref 30.0–36.0)
MCV: 92 fL (ref 80.0–100.0)
Platelets: 222 10*3/uL (ref 150–400)
RBC: 4.12 MIL/uL — ABNORMAL LOW (ref 4.22–5.81)
RDW: 12.7 % (ref 11.5–15.5)
WBC: 8 10*3/uL (ref 4.0–10.5)
nRBC: 0 % (ref 0.0–0.2)

## 2018-03-18 NOTE — Progress Notes (Signed)
ALEXAVIER TSUTSUI            03/17/2018                          [id number] - [place], [place] [address] [place] [address] Phone: [phone] Fax: [phone]              Your procedure is scheduled on Wed. Jan. 29, 2020 from 8:30AM-9:45AM            Report to [facility] Admitting Entrance "A" at 6:30AM            Call this number if you have problems the morning of surgery:            [phone]             Remember:            Do not eat or drink after midnight on Jan. 28th                        Take these medicines the morning of surgery with A SIP OF WATER: Atenolol (TENORMIN), CloNIDine (CATAPRES), Esomeprazole (NEXIUM), and Tamsulosin (FLOMAX), Methadone  As of today, stop taking all Other Aspirin Products, Vitamins, Fish oils, and Herbal medications. Also stop all NSAIDS i.e. Advil, Ibuprofen, Motrin, Aleve, Anaprox, Naproxen, BC, Goody Powders, and all Supplements.                        Do not wear jewelry.            Do not wear lotions, powders, colognes, or deodorant.            Do not shave 48 hours prior to surgery.  Men may shave face.            Do not bring valuables to the hospital.            [facility] is not responsible for any belongings or valuables.  Contacts, dentures or bridgework may not be worn into surgery.  Leave your suitcase in the car.  After surgery it may be brought to your room.  For patients admitted to the hospital, discharge time will be determined by your treatment team.  Patients discharged the day of surgery will not be allowed to drive home.   Special instructions: [place]- Preparing For Surgery  Before surgery, you can play an important role. Because skin is not sterile, your skin needs to be as free of germs as possible. You can reduce the number of germs on your skin by washing with CHG (chlorahexidine gluconate) Soap  before surgery.  CHG is an antiseptic cleaner which kills germs and bonds with the skin to continue killing germs even after washing.    Oral Hygiene is also important to reduce your risk of infection.  Remember - BRUSH YOUR TEETH THE MORNING OF SURGERY WITH YOUR REGULAR TOOTHPASTE  Please do not use if you have an allergy to CHG or antibacterial soaps. If your skin becomes reddened/irritated stop using the CHG.  Do not shave (including legs and underarms) for at least 48 hours prior to first CHG shower. It is OK to shave your face.  Please follow these instructions carefully.  1. Shower the NIGHT BEFORE SURGERY and the MORNING OF SURGERY with CHG.   2. If you chose to wash your hair, wash your hair first as usual with your normal shampoo.  3. After you shampoo, rinse your hair and body thoroughly to remove the shampoo.  4. Use CHG as you would any other liquid soap. You can apply CHG directly to the skin and wash gently with a scrungie or a clean washcloth.   5. Apply the CHG Soap to your body ONLY FROM THE NECK DOWN.  Do not use on open wounds or open sores. Avoid contact with your eyes, ears, mouth and genitals (private parts). Wash Face and genitals (private parts)  with your normal soap.  6. Wash thoroughly, paying special attention to the area where your surgery will be performed.  7. Thoroughly rinse your body with warm water from the neck down.  8. DO NOT shower/wash with your normal soap after using and rinsing off the CHG Soap.  9. Pat yourself dry with a CLEAN TOWEL.  10. Wear CLEAN PAJAMAS to bed the night before surgery, wear comfortable clothes the morning of surgery  11. Place CLEAN SHEETS on your bed the night of your first shower and DO NOT SLEEP WITH PETS.  Day of Surgery:  Do not apply any deodorants/lotions.  Please wear clean clothes  to the hospital/surgery center.   Remember to brush your teeth WITH YOUR REGULAR TOOTHPASTE.  Please read over the following fact sheets that you were given. Pain Booklet, Coughing and Deep Breathing and Surgical Site Infection Prevention

## 2018-03-18 NOTE — Progress Notes (Addendum)
PCP - [name], MD Cardiologist - pt denies  Chest x-ray - denies past year, no recent respiratory infections/complications EKG - 4/37/3578 in EPIC  Stress Test - pt denies ECHO -  02/2011 in EPIC  Cardiac Cath - pt denies  Sleep Study - pt denies CPAP - denies  Fasting Blood Sugar - n/a Checks Blood Sugar _____ times a day-n/a  Blood Thinner Instructions: n/a Aspirin Instructions: n/a  Anesthesia review: pending labs/EKG  Patient denies shortness of breath, fever, cough and chest pain at PAT appointment  Patient verbalized understanding of instructions that were given to them at the PAT appointment. Patient was also instructed that they will need to review over the PAT instructions again at home before surgery. c

## 2018-03-19 NOTE — Progress Notes (Signed)
Anesthesia Chart Review:  Case:  [id number] Date/Time:  03/24/18 0815   Procedure:  LAPAROSCOPIC LEFT INGUINAL HERNIA WITH MESH (Left )   Anesthesia type:  General   Pre-op diagnosis:  left inguinal hernia   Location:  [facility] OR ROOM 08 / [place] OR   Surgeon:  Ralene Ok, MD      DISCUSSION: Edward Morgan never smoker. Pertinent hx includes Hep C s/p Harvoni, GERD, HTN, CVA (2012), CKD IV, Substance abuse (per PCP notes entered rehab program 12/2017 for opioid dependence, now on methadone).  Pt has stable CKD followed by PCP. He has a stable renal mass that is being followed by urology with serial ultrasounds. Creatinine, Ser (mg/dL)  Date Value  03/18/2018 2.04 (H)  07/01/2017 1.81 (H)  12/03/2016 1.96 (H)  06/04/2016 2.07 (H)  03/19/2015 1.80 (H)   Pt treated for Hep C at [facility] Hepatology clinic. Per notes from 2016 "[name] has been stable from a liver standpoint.  He attained sustained virologic response with a course of Harvoni.  While a prior fibro-sure suggested advanced fibrosis, more recent elastography suggested minimal fibrosis in his liver appeared normal on this study.  I do not think he needs follow-up in this clinic list new issues arise."  Anticipate he can proceed as planned barring acute status change.  VS: BP 137/63   Pulse 63   Temp 36.5 C   Resp 20   Ht 6' (1.829 m)   Wt 87.9 kg   SpO2 95%   BMI 26.28 kg/m   PROVIDERS: [name], MD is PCP   LABS: Labs reviewed: Acceptable for surgery. Elevated creatinine c/w pt hx of CKD.  (all labs ordered are listed, but only abnormal results are displayed)  Labs Reviewed  COMPREHENSIVE METABOLIC PANEL - Abnormal; Notable for the following components:      Result Value   BUN 25 (*)    Creatinine, Ser 2.04 (*)    GFR calc non Af Amer 32 (*)    GFR calc Af Amer 37 (*)    All other components within normal limits  CBC - Abnormal; Notable for the following components:   RBC 4.12 (*)    Hemoglobin 12.1 (*)    HCT 37.9 (*)    All other components within normal limits    EKG: 03/18/2018: Sinus bradycardia with 1st degree A-V block. Rate 56. Otherwise normal ECG  CV: TTE 03/25/2011: Study Conclusions  - Procedure narrative: Transthoracic echocardiography. Image quality was adequate. The study was technically difficult, as a result of poor sound wave transmission. - Left ventricle: The cavity size was normal. There was moderate concentric hypertrophy. Systolic function was normal. The estimated ejection fraction was in the range of 55% to 60%. Wall motion was normal; there were no regional wall motion abnormalities. Doppler parameters are consistent with abnormal left ventricular relaxation (grade 1 diastolic dysfunction). The E/e' ratio is <10, suggesting normal LV filling pressure. - Left atrium: The atrium was at the upper limits of normal in size. - Inferior vena cava: The vessel was normal in size; the respirophasic diameter changes were in the normal range (= 50%); findings are consistent with normal central venous pressure.  Past Medical History:  Diagnosis Date  . Arthritis   . Depression   . GERD (gastroesophageal reflux disease)   . H. pylori infection    history of  . Hepatitis C    Patient has failed therapy in the past and has never been treated  for hepatitis C because of particular mutant  that he is suffering from.  . Hypertension   . Shortness of breath   . Stroke [facility]) 01/27/2011    Past Surgical History:  Procedure Laterality Date  . Closed reduction, percutaneous pinning of right 5th finger,    . COLONOSCOPY  10/09   by [facility]: showed sigmoid Diverticulosis  . left knee surgery    . ROTATOR CUFF REPAIR      MEDICATIONS: . methadone (DOLOPHINE) 10 MG tablet  . amLODipine (NORVASC) 10 MG tablet  . atenolol (TENORMIN) 50 MG tablet  . cloNIDine (CATAPRES) 0.1 MG tablet  . esomeprazole (NEXIUM) 40 MG  capsule  . hydrochlorothiazide (HYDRODIURIL) 25 MG tablet  . Multiple Vitamin (MULTIVITAMIN WITH MINERALS) TABS tablet  . pravastatin (PRAVACHOL) 20 MG tablet  . quinapril (ACCUPRIL) 20 MG tablet  . tamsulosin (FLOMAX) 0.4 MG CAPS capsule   No current facility-administered medications for this encounter.     Wynonia Musty [facility]/Anesthesiology Phone [phone] 03/19/2018 11:22 AM

## 2018-03-19 NOTE — Anesthesia Preprocedure Evaluation (Addendum)
Anesthesia Evaluation  Patient identified by MRN, date of birth, ID band Patient awake    Reviewed: Allergy & Precautions, NPO status , Patient's Chart, lab work & pertinent test results  Airway Mallampati: II  TM Distance: >3 FB Neck ROM: Full    Dental no notable dental hx.    Pulmonary neg pulmonary ROS,    Pulmonary exam normal breath sounds clear to auscultation       Cardiovascular hypertension, Pt. on medications negative cardio ROS Normal cardiovascular exam Rhythm:Regular Rate:Normal     Neuro/Psych Depression CVA, No Residual Symptoms negative psych ROS   GI/Hepatic Neg liver ROS, GERD  ,  Endo/Other  negative endocrine ROS  Renal/GU Renal InsufficiencyRenal disease  negative genitourinary   Musculoskeletal  (+) Arthritis , Osteoarthritis,    Abdominal   Peds negative pediatric ROS (+)  Hematology negative hematology ROS (+)   Anesthesia Other Findings   Reproductive/Obstetrics negative OB ROS                            Anesthesia Physical Anesthesia Plan  ASA: III  Anesthesia Plan: General   Post-op Pain Management:    Induction: Intravenous  PONV Risk Score and Plan: 2 and Ondansetron, Midazolam and Treatment may vary due to age or medical condition  Airway Management Planned: Oral ETT  Additional Equipment:   Intra-op Plan:   Post-operative Plan: Extubation in OR  Informed Consent: I have reviewed the patients History and Physical, chart, labs and discussed the procedure including the risks, benefits and alternatives for the proposed anesthesia with the patient or authorized representative who has indicated his/her understanding and acceptance.     Dental advisory given  Plan Discussed with: CRNA  Anesthesia Plan Comments: (See PAT note by Karoline Caldwell, PA-C )       Anesthesia Quick Evaluation

## 2018-03-23 MED ORDER — VANCOMYCIN HCL 10 G IV SOLR
1500.0000 mg | INTRAVENOUS | Status: AC
  Administered 2018-03-24: 1500 mg via INTRAVENOUS
  Filled 2018-03-23: qty 1500

## 2018-03-24 ENCOUNTER — Ambulatory Visit (HOSPITAL_COMMUNITY)
Admission: RE | Admit: 2018-03-24 | Discharge: 2018-03-24 | Disposition: A | Discharge status: Home / Self Care | Payer: Medicare Other | Attending: General Surgery | Admitting: General Surgery

## 2018-03-24 ENCOUNTER — Encounter (HOSPITAL_COMMUNITY)
Admission: RE | Disposition: A | Discharge status: Home / Self Care | Payer: Self-pay | Source: Home / Self Care | Attending: General Surgery

## 2018-03-24 ENCOUNTER — Encounter (HOSPITAL_COMMUNITY): Payer: Self-pay | Admitting: *Deleted

## 2018-03-24 ENCOUNTER — Ambulatory Visit (HOSPITAL_COMMUNITY): Payer: Medicare Other | Admitting: Vascular Surgery

## 2018-03-24 ENCOUNTER — Ambulatory Visit (HOSPITAL_COMMUNITY): Payer: Medicare Other

## 2018-03-24 ENCOUNTER — Other Ambulatory Visit: Payer: Self-pay

## 2018-03-24 DIAGNOSIS — K219 Gastro-esophageal reflux disease without esophagitis: Secondary | ICD-10-CM | POA: Diagnosis not present

## 2018-03-24 DIAGNOSIS — K4090 Unilateral inguinal hernia, without obstruction or gangrene, not specified as recurrent: Secondary | ICD-10-CM | POA: Diagnosis not present

## 2018-03-24 DIAGNOSIS — I10 Essential (primary) hypertension: Secondary | ICD-10-CM | POA: Insufficient documentation

## 2018-03-24 DIAGNOSIS — F1190 Opioid use, unspecified, uncomplicated: Secondary | ICD-10-CM | POA: Diagnosis not present

## 2018-03-24 DIAGNOSIS — Z79899 Other long term (current) drug therapy: Secondary | ICD-10-CM | POA: Diagnosis not present

## 2018-03-24 DIAGNOSIS — E785 Hyperlipidemia, unspecified: Secondary | ICD-10-CM | POA: Diagnosis not present

## 2018-03-24 DIAGNOSIS — Z8673 Personal history of transient ischemic attack (TIA), and cerebral infarction without residual deficits: Secondary | ICD-10-CM | POA: Diagnosis not present

## 2018-03-24 DIAGNOSIS — I129 Hypertensive chronic kidney disease with stage 1 through stage 4 chronic kidney disease, or unspecified chronic kidney disease: Secondary | ICD-10-CM | POA: Diagnosis not present

## 2018-03-24 DIAGNOSIS — N184 Chronic kidney disease, stage 4 (severe): Secondary | ICD-10-CM | POA: Diagnosis not present

## 2018-03-24 HISTORY — PX: INGUINAL HERNIA REPAIR: SHX194

## 2018-03-24 HISTORY — PX: INSERTION OF MESH: SHX5868

## 2018-03-24 SURGERY — REPAIR, HERNIA, INGUINAL, LAPAROSCOPIC
Anesthesia: General | Site: Groin | Laterality: Left

## 2018-03-24 MED ORDER — HYDROMORPHONE HCL 1 MG/ML IJ SOLN
0.2500 mg | INTRAMUSCULAR | Status: DC | PRN
  Administered 2018-03-24: 0.5 mg via INTRAVENOUS

## 2018-03-24 MED ORDER — ACETAMINOPHEN 500 MG PO TABS
1000.0000 mg | ORAL_TABLET | ORAL | Status: AC
  Administered 2018-03-24: 1000 mg via ORAL

## 2018-03-24 MED ORDER — ONDANSETRON HCL 4 MG/2ML IJ SOLN
INTRAMUSCULAR | Status: DC | PRN
  Administered 2018-03-24: 4 mg via INTRAVENOUS

## 2018-03-24 MED ORDER — CHLORHEXIDINE GLUCONATE CLOTH 2 % EX PADS: 6.0000 | MEDICATED_PAD | Freq: Once | CUTANEOUS | Status: DC

## 2018-03-24 MED ORDER — GABAPENTIN 300 MG PO CAPS
ORAL_CAPSULE | ORAL | Status: AC
  Administered 2018-03-24: 300 mg via ORAL
  Filled 2018-03-24: qty 1

## 2018-03-24 MED ORDER — DEXAMETHASONE SODIUM PHOSPHATE 10 MG/ML IJ SOLN
INTRAMUSCULAR | Status: AC
  Filled 2018-03-24: qty 1

## 2018-03-24 MED ORDER — ROCURONIUM BROMIDE 50 MG/5ML IV SOSY
PREFILLED_SYRINGE | INTRAVENOUS | Status: DC | PRN
  Administered 2018-03-24: 80 mg via INTRAVENOUS

## 2018-03-24 MED ORDER — OXYCODONE HCL 5 MG PO TABS
ORAL_TABLET | ORAL | Status: AC
  Administered 2018-03-24: 5 mg via ORAL
  Filled 2018-03-24: qty 1

## 2018-03-24 MED ORDER — ROCURONIUM BROMIDE 50 MG/5ML IV SOSY
PREFILLED_SYRINGE | INTRAVENOUS | Status: AC
  Filled 2018-03-24: qty 5

## 2018-03-24 MED ORDER — CELECOXIB 200 MG PO CAPS
200.0000 mg | ORAL_CAPSULE | ORAL | Status: AC
  Administered 2018-03-24: 200 mg via ORAL

## 2018-03-24 MED ORDER — ONDANSETRON HCL 4 MG/2ML IJ SOLN
INTRAMUSCULAR | Status: AC
  Filled 2018-03-24: qty 2

## 2018-03-24 MED ORDER — LIDOCAINE 2% (20 MG/ML) 5 ML SYRINGE
INTRAMUSCULAR | Status: AC
  Filled 2018-03-24: qty 5

## 2018-03-24 MED ORDER — SODIUM CHLORIDE 0.9 % IR SOLN
Status: DC | PRN
  Administered 2018-03-24: 1000 mL

## 2018-03-24 MED ORDER — 0.9 % SODIUM CHLORIDE (POUR BTL) OPTIME
TOPICAL | Status: DC | PRN
  Administered 2018-03-24: 1000 mL

## 2018-03-24 MED ORDER — PROPOFOL 10 MG/ML IV BOLUS
INTRAVENOUS | Status: DC | PRN
  Administered 2018-03-24: 120 mg via INTRAVENOUS

## 2018-03-24 MED ORDER — OXYCODONE HCL 5 MG/5ML PO SOLN: 5.0000 mg | Freq: Once | ORAL | Status: AC | PRN

## 2018-03-24 MED ORDER — FENTANYL CITRATE (PF) 100 MCG/2ML IJ SOLN
INTRAMUSCULAR | Status: DC | PRN
  Administered 2018-03-24: 100 ug via INTRAVENOUS

## 2018-03-24 MED ORDER — OXYCODONE HCL 5 MG PO TABS
5.0000 mg | ORAL_TABLET | Freq: Once | ORAL | Status: AC | PRN
  Administered 2018-03-24: 5 mg via ORAL

## 2018-03-24 MED ORDER — GABAPENTIN 300 MG PO CAPS
300.0000 mg | ORAL_CAPSULE | ORAL | Status: AC
  Administered 2018-03-24: 300 mg via ORAL

## 2018-03-24 MED ORDER — DEXAMETHASONE SODIUM PHOSPHATE 10 MG/ML IJ SOLN
INTRAMUSCULAR | Status: DC | PRN
  Administered 2018-03-24: 5 mg via INTRAVENOUS

## 2018-03-24 MED ORDER — STERILE WATER FOR IRRIGATION IR SOLN
Status: DC | PRN
  Administered 2018-03-24: 1000 mL

## 2018-03-24 MED ORDER — LIDOCAINE 2% (20 MG/ML) 5 ML SYRINGE
INTRAMUSCULAR | Status: DC | PRN
  Administered 2018-03-24: 80 mg via INTRAVENOUS

## 2018-03-24 MED ORDER — PROMETHAZINE HCL 25 MG/ML IJ SOLN: 6.2500 mg | INTRAMUSCULAR | Status: DC | PRN

## 2018-03-24 MED ORDER — LACTATED RINGERS IV SOLN
INTRAVENOUS | Status: DC
  Administered 2018-03-24: 08:00:00 via INTRAVENOUS

## 2018-03-24 MED ORDER — PROPOFOL 10 MG/ML IV BOLUS
INTRAVENOUS | Status: AC
  Filled 2018-03-24: qty 20

## 2018-03-24 MED ORDER — SUGAMMADEX SODIUM 500 MG/5ML IV SOLN
INTRAVENOUS | Status: AC
  Filled 2018-03-24: qty 5

## 2018-03-24 MED ORDER — EPHEDRINE SULFATE-NACL 50-0.9 MG/10ML-% IV SOSY
PREFILLED_SYRINGE | INTRAVENOUS | Status: DC | PRN
  Administered 2018-03-24 (×5): 5 mg via INTRAVENOUS

## 2018-03-24 MED ORDER — BUPIVACAINE HCL 0.25 % IJ SOLN
INTRAMUSCULAR | Status: DC | PRN
  Administered 2018-03-24: 6 mL

## 2018-03-24 MED ORDER — BUPIVACAINE HCL (PF) 0.25 % IJ SOLN
INTRAMUSCULAR | Status: AC
  Filled 2018-03-24: qty 30

## 2018-03-24 MED ORDER — FENTANYL CITRATE (PF) 250 MCG/5ML IJ SOLN
INTRAMUSCULAR | Status: AC
  Filled 2018-03-24: qty 5

## 2018-03-24 MED ORDER — TRAMADOL HCL 50 MG PO TABS: 50.0000 mg | ORAL_TABLET | Freq: Four times a day (QID) | ORAL | 0 refills | Status: AC | PRN

## 2018-03-24 MED ORDER — HYDROMORPHONE HCL 1 MG/ML IJ SOLN
INTRAMUSCULAR | Status: AC
  Filled 2018-03-24: qty 1

## 2018-03-24 MED ORDER — MIDAZOLAM HCL 5 MG/5ML IJ SOLN
INTRAMUSCULAR | Status: DC | PRN
  Administered 2018-03-24: 1 mg via INTRAVENOUS

## 2018-03-24 MED ORDER — MIDAZOLAM HCL 2 MG/2ML IJ SOLN
INTRAMUSCULAR | Status: AC
  Filled 2018-03-24: qty 2

## 2018-03-24 MED ORDER — SUGAMMADEX SODIUM 500 MG/5ML IV SOLN
INTRAVENOUS | Status: DC | PRN
  Administered 2018-03-24: 350 mg via INTRAVENOUS

## 2018-03-24 MED ORDER — ACETAMINOPHEN 500 MG PO TABS
ORAL_TABLET | ORAL | Status: AC
  Administered 2018-03-24: 1000 mg via ORAL
  Filled 2018-03-24: qty 2

## 2018-03-24 MED ORDER — CELECOXIB 200 MG PO CAPS
ORAL_CAPSULE | ORAL | Status: AC
  Administered 2018-03-24: 200 mg via ORAL
  Filled 2018-03-24: qty 1

## 2018-03-24 SURGICAL SUPPLY — 43 items
ADH SKN CLS APL DERMABOND .7 (GAUZE/BANDAGES/DRESSINGS) ×1
CANISTER SUCT 3000ML PPV (MISCELLANEOUS) IMPLANT
COVER SURGICAL LIGHT HANDLE (MISCELLANEOUS) ×3 IMPLANT
COVER WAND RF STERILE (DRAPES) ×3 IMPLANT
DEFOGGER SCOPE WARMER CLEARIFY (MISCELLANEOUS) IMPLANT
DERMABOND ADVANCED (GAUZE/BANDAGES/DRESSINGS) ×2
DERMABOND ADVANCED .7 DNX12 (GAUZE/BANDAGES/DRESSINGS) ×1 IMPLANT
DISSECTOR BLUNT TIP ENDO 5MM (MISCELLANEOUS) IMPLANT
ELECT REM PT RETURN 9FT ADLT (ELECTROSURGICAL) ×3
ELECTRODE REM PT RTRN 9FT ADLT (ELECTROSURGICAL) ×1 IMPLANT
ENDOLOOP SUT PDS II  0 18 (SUTURE) ×2
ENDOLOOP SUT PDS II 0 18 (SUTURE) IMPLANT
GLOVE BIO SURGEON STRL SZ7.5 (GLOVE) ×3 IMPLANT
GOWN STRL REUS W/ TWL LRG LVL3 (GOWN DISPOSABLE) ×2 IMPLANT
GOWN STRL REUS W/ TWL XL LVL3 (GOWN DISPOSABLE) ×1 IMPLANT
GOWN STRL REUS W/TWL LRG LVL3 (GOWN DISPOSABLE) ×6
GOWN STRL REUS W/TWL XL LVL3 (GOWN DISPOSABLE) ×3
KIT BASIN OR (CUSTOM PROCEDURE TRAY) ×3 IMPLANT
KIT TURNOVER KIT B (KITS) ×3 IMPLANT
MESH 3DMAX 5X7 LT XLRG (Mesh General) ×2 IMPLANT
NDL INSUFFLATION 14GA 120MM (NEEDLE) IMPLANT
NEEDLE INSUFFLATION 14GA 120MM (NEEDLE) IMPLANT
NS IRRIG 1000ML POUR BTL (IV SOLUTION) ×3 IMPLANT
PAD ARMBOARD 7.5X6 YLW CONV (MISCELLANEOUS) ×6 IMPLANT
RELOAD STAPLE 4.0 BLU F/HERNIA (INSTRUMENTS) ×1 IMPLANT
RELOAD STAPLE 4.8 BLK F/HERNIA (STAPLE) IMPLANT
RELOAD STAPLE HERNIA 4.0 BLUE (INSTRUMENTS) ×3 IMPLANT
RELOAD STAPLE HERNIA 4.8 BLK (STAPLE) IMPLANT
SCISSORS LAP 5X35 DISP (ENDOMECHANICALS) ×3 IMPLANT
SET IRRIG TUBING LAPAROSCOPIC (IRRIGATION / IRRIGATOR) ×2 IMPLANT
SET TUBE SMOKE EVAC HIGH FLOW (TUBING) ×3 IMPLANT
STAPLER HERNIA 12 8.5 360D (INSTRUMENTS) ×3 IMPLANT
SUT MNCRL AB 4-0 PS2 18 (SUTURE) ×3 IMPLANT
SUT VIC AB 1 CT1 27 (SUTURE)
SUT VIC AB 1 CT1 27XBRD ANBCTR (SUTURE) IMPLANT
SYRINGE TOOMEY DISP (SYRINGE) ×3 IMPLANT
TOWEL OR 17X24 6PK STRL BLUE (TOWEL DISPOSABLE) ×3 IMPLANT
TOWEL OR 17X26 10 PK STRL BLUE (TOWEL DISPOSABLE) ×3 IMPLANT
TRAY FOLEY W/BAG SLVR 14FR (SET/KITS/TRAYS/PACK) ×3 IMPLANT
TRAY LAPAROSCOPIC MC (CUSTOM PROCEDURE TRAY) ×3 IMPLANT
TROCAR CANNULA W/PORT DUAL 5MM (MISCELLANEOUS) ×3 IMPLANT
TROCAR XCEL 12X100 BLDLESS (ENDOMECHANICALS) ×3 IMPLANT
WATER STERILE IRR 1000ML POUR (IV SOLUTION) ×3 IMPLANT

## 2018-03-24 NOTE — Op Note (Signed)
03/24/2018  9:20 AM  PATIENT:  Edward Morgan  68 y.o. male  PRE-OPERATIVE DIAGNOSIS:  left inguinal hernia  POST-OPERATIVE DIAGNOSIS:  Giant left indirect inguinal hernia  PROCEDURE:  Procedure(s): LAPAROSCOPIC LEFT INGUINAL HERNIA WITH MESH (Left) Insertion Of Mesh (Left)  SURGEON:  Surgeon(s) and Role:    Ralene Ok, MD - Primary  ANESTHESIA:   local and general  EBL:  5 mL   BLOOD ADMINISTERED:none  DRAINS: none   LOCAL MEDICATIONS USED:  BUPIVICAINE   SPECIMEN:  No Specimen  DISPOSITION OF SPECIMEN:  N/A  COUNTS:  YES  TOURNIQUET:  * No tourniquets in log *  DICTATION: .Dragon Dictation  Counts: reported as correct x 2  Findings:  The patient had a giant /left indirect hernia  Indications for procedure:  The patient is a 68 year old male with a left hernia for several months. Patient complained of symptomatology to his left inguinal area. The patient was taken back for elective inguinal hernia repair.  Details of the procedure: The patient was taken back to the operating room. The patient was placed in supine position with bilateral SCDs in place.  The patient was prepped and draped in the usual sterile fashion.  After appropriate anitbiotics were confirmed, a time-out was confirmed and all facts were verified.  0.25% Marcaine was used to infiltrate the umbilical area. A 11-blade was used to cut down the skin and blunt dissection was used to get the anterior fashion.  The anterior fascia was incised approximately 1 cm and the muscles were retracted laterally. Blunt dissection was then used to create a space in the preperitoneal area. At this time a 10 mm camera was then introduced into the space and advanced the pubic tubercle and a 12 mm trocar was placed over this and insufflation was started.  At this time and space was created from medial to laterally the preperitoneal space.  Cooper's ligament was initially cleaned off.  The hernia and giant sac was  identified in the indirect space. Dissection of the hernia sac was undertaken the vas deferens was identified and protected in all parts of the case.  There was a small tear into the hernia sac. A Veress needle right upper quadrant to help evacuate the intraperitoneal air.  The hole was ligated with a 0 PDS Endoloop.  Once the hernia sac was taken down to approximately the umbilicus a Bard 3D Max mesh, size: Rachelle Hora, was  introduced into the preperitoneal space.  The mesh was brought over to cover the direct and indirect hernia spaces.  This was anchored into place and secured to Cooper's ligament with 4.10mm staples from a [place] hernia stapler. It was anchored to the anterior abdominal wall with 4.8 mm staples. The hernia sac was seen lying posterior to the mesh. There was no staples placed laterally. The insufflation was evacuated and the peritoneum was seen posterior to the mesh. The trochars were removed. The anterior fascia was reapproximated using #1 Vicryl on a UR- 6.  Intra-abdominal air was evacuated and the Veress needle removed. The skin was reapproximated using 4-0 Monocryl subcuticular fashion and dressed with Dermbond.  The patient was awakened from general anesthesia and taken to recovery in stable condition.    PLAN OF CARE: Discharge to home after PACU  PATIENT DISPOSITION:  PACU - hemodynamically stable.   Delay start of Pharmacological VTE agent (>24hrs) due to surgical blood loss or risk of bleeding: not applicable

## 2018-03-24 NOTE — Interval H&P Note (Signed)
History and Physical Interval Note:  03/24/2018 7:50 AM  Edward Morgan  has presented today for surgery, with the diagnosis of left inguinal hernia  The various methods of treatment have been discussed with the patient and family. After consideration of risks, benefits and other options for treatment, the patient has consented to  Procedure(s): LAPAROSCOPIC LEFT INGUINAL HERNIA WITH MESH (Left) as a surgical intervention .  The patient's history has been reviewed, patient examined, no change in status, stable for surgery.  I have reviewed the patient's chart and labs.  Questions were answered to the patient's satisfaction.     Ralene Ok

## 2018-03-24 NOTE — Anesthesia Procedure Notes (Addendum)
Procedure Name: Intubation Date/Time: 03/24/2018 8:19 AM Performed by: Renato Shin, CRNA Pre-anesthesia Checklist: Patient identified, Emergency Drugs available, Suction available and Patient being monitored Patient Re-evaluated:Patient Re-evaluated prior to induction Oxygen Delivery Method: Circle system utilized Preoxygenation: Pre-oxygenation with 100% oxygen Induction Type: IV induction and Cricoid Pressure applied Ventilation: Oral airway inserted - appropriate to patient size Laryngoscope Size: Mac and 4 Grade View: Grade I Tube size: 7.5 mm Number of attempts: 1 Airway Equipment and Method: Stylet Placement Confirmation: ETT inserted through vocal cords under direct vision,  positive ETCO2 and breath sounds checked- equal and bilateral Secured at: 23 cm Tube secured with: Tape Dental Injury: Teeth and Oropharynx as per pre-operative assessment  Comments: DL and intubation done by Simeon Craft.

## 2018-03-24 NOTE — Progress Notes (Signed)
Dilaudid .5mg  iv wasted in narc. Bin. Witnessed by Jake Bathe RN

## 2018-03-24 NOTE — Discharge Instructions (Signed)
[facility] _______[facility] °INGUINAL HERNIA REPAIR: POST OP INSTRUCTIONS ° °Always review your discharge instruction sheet given to you by the facility where your surgery was performed. °IF YOU HAVE DISABILITY OR FAMILY LEAVE FORMS, YOU MUST BRING THEM TO THE OFFICE FOR PROCESSING.   °DO NOT GIVE THEM TO YOUR DOCTOR. ° °1. A  prescription for pain medication may be given to you upon discharge.  Take your pain medication as prescribed, if needed.  If narcotic pain medicine is not needed, then you may take acetaminophen (Tylenol) or ibuprofen (Advil) as needed. °2. Take your usually prescribed medications unless otherwise directed. °If you need a refill on your pain medication, please contact your pharmacy.  They will contact our office to request authorization. Prescriptions will not be filled after 5 pm or on week-ends. °3. You should follow a light diet the first 24 hours after arrival home, such as soup and crackers, etc.  Be sure to include lots of fluids daily.  Resume your normal diet the day after surgery. °4.Most patients will experience some swelling and bruising around the umbilicus or in the groin and scrotum.  Ice packs and reclining will help.  Swelling and bruising can take several days to resolve.  °6. It is common to experience some constipation if taking pain medication after surgery.  Increasing fluid intake and taking a stool softener (such as Colace) will usually help or prevent this problem from occurring.  A mild laxative (Milk of Magnesia or Miralax) should be taken according to package directions if there are no bowel movements after 48 hours. °7. Unless discharge instructions indicate otherwise, you may remove your bandages 24-48 hours after surgery, and you may shower at that time.  You may have steri-strips (small skin tapes) in place directly over the incision.  These strips should be left on the skin for 7-10 days.  If your surgeon used skin glue on the incision, you may  shower in 24 hours.  The glue will flake off over the next 2-3 weeks.  Any sutures or staples will be removed at the office during your follow-up visit. °8. ACTIVITIES:  You may resume regular (light) daily activities beginning the next day--such as daily self-care, walking, climbing stairs--gradually increasing activities as tolerated.  You may have sexual intercourse when it is comfortable.  Refrain from any heavy lifting or straining until approved by your doctor. ° °a.You may drive when you are no longer taking prescription pain medication, you can comfortably wear a seatbelt, and you can safely maneuver your car and apply brakes. °b.RETURN TO WORK:   °_____________________________________________ ° °9.You should see your doctor in the office for a follow-up appointment approximately 2-3 weeks after your surgery.  Make sure that you call for this appointment within a day or two after you arrive home to insure a convenient appointment time. °10.OTHER INSTRUCTIONS: _________________________ °   _____________________________________ ° °WHEN TO CALL YOUR DOCTOR: °1. Fever over 101.0 °2. Inability to urinate °3. Nausea and/or vomiting °4. Extreme swelling or bruising °5. Continued bleeding from incision. °6. Increased pain, redness, or drainage from the incision ° °The clinic staff is available to answer your questions during regular business hours.  Please don’t hesitate to call and ask to speak to one of the nurses for clinical concerns.  If you have a medical emergency, go to the nearest emergency room or call 911.  A surgeon from [facility] is always on call at the hospital ° ° °[address], [address], [place], [place]  [address] ? ° [address] [address], [place], [place]   [address] °[phone] ? [phone] ? FAX [phone] °Web site: [email or web address] ° °

## 2018-03-24 NOTE — Transfer of Care (Signed)
Immediate Anesthesia Transfer of Care Note  Patient: Edward Morgan  Procedure(s) Performed: LAPAROSCOPIC LEFT INGUINAL HERNIA WITH MESH (Left Groin) Insertion Of Mesh (Left Groin)  Patient Location: PACU  Anesthesia Type:General  Level of Consciousness: drowsy and patient cooperative  Airway & Oxygen Therapy: Patient Spontanous Breathing and Patient connected to nasal cannula oxygen  Post-op Assessment: Report given to RN and Post -op Vital signs reviewed and stable  Post vital signs: Reviewed and stable  Last Vitals:  Vitals Value Taken Time  BP 112/65 03/24/2018  9:31 AM  Temp    Pulse 59 03/24/2018  9:33 AM  Resp 14 03/24/2018  9:33 AM  SpO2 100 % 03/24/2018  9:33 AM  Vitals shown include unvalidated device data.  Last Pain:  Vitals:   03/24/18 0655  TempSrc:   PainSc: 8       Patients Stated Pain Goal: 3 (93/23/55 7322)  Complications: No apparent anesthesia complications

## 2018-03-24 NOTE — Anesthesia Postprocedure Evaluation (Signed)
Anesthesia Post Note  Patient: Edward Morgan  Procedure(s) Performed: LAPAROSCOPIC LEFT INGUINAL HERNIA WITH MESH (Left Groin) Insertion Of Mesh (Left Groin)     Patient location during evaluation: PACU Anesthesia Type: General Level of consciousness: awake and alert Pain management: pain level controlled Vital Signs Assessment: post-procedure vital signs reviewed and stable Respiratory status: spontaneous breathing, nonlabored ventilation and respiratory function stable Cardiovascular status: blood pressure returned to baseline and stable Postop Assessment: no apparent nausea or vomiting Anesthetic complications: no    Last Vitals:  Vitals:   03/24/18 1000 03/24/18 1020  BP: 111/60 117/63  Pulse: (!) 56 68  Resp: 11 12  Temp: 36.5 C   SpO2: 99% 97%    Last Pain:  Vitals:   03/24/18 1000  TempSrc:   PainSc: Asleep                 Lynda Rainwater

## 2018-03-25 ENCOUNTER — Encounter (HOSPITAL_COMMUNITY): Payer: Self-pay | Admitting: General Surgery

## 2018-04-28 ENCOUNTER — Other Ambulatory Visit: Payer: Self-pay | Admitting: Family Medicine

## 2018-06-30 ENCOUNTER — Other Ambulatory Visit: Payer: Self-pay | Admitting: Family Medicine

## 2018-08-20 ENCOUNTER — Other Ambulatory Visit: Payer: Self-pay | Admitting: *Deleted

## 2018-08-20 MED ORDER — ESOMEPRAZOLE MAGNESIUM 40 MG PO CPDR: 40.0000 mg | DELAYED_RELEASE_CAPSULE | Freq: Every day | ORAL | 3 refills | Status: DC

## 2018-08-30 ENCOUNTER — Other Ambulatory Visit: Payer: Self-pay

## 2018-08-31 MED ORDER — HYDROCHLOROTHIAZIDE 25 MG PO TABS: ORAL_TABLET | ORAL | 3 refills | Status: DC

## 2018-09-06 DIAGNOSIS — N281 Cyst of kidney, acquired: Secondary | ICD-10-CM | POA: Diagnosis not present

## 2018-09-14 DIAGNOSIS — N281 Cyst of kidney, acquired: Secondary | ICD-10-CM | POA: Diagnosis not present

## 2018-09-27 ENCOUNTER — Other Ambulatory Visit: Payer: Self-pay

## 2018-09-27 MED ORDER — ATENOLOL 50 MG PO TABS: 50.0000 mg | ORAL_TABLET | Freq: Every day | ORAL | 3 refills | Status: DC

## 2018-10-05 DIAGNOSIS — M545 Low back pain: Secondary | ICD-10-CM | POA: Diagnosis not present

## 2018-10-05 DIAGNOSIS — E7800 Pure hypercholesterolemia, unspecified: Secondary | ICD-10-CM | POA: Diagnosis not present

## 2018-10-05 DIAGNOSIS — R7302 Impaired glucose tolerance (oral): Secondary | ICD-10-CM | POA: Diagnosis not present

## 2018-10-05 DIAGNOSIS — Z79899 Other long term (current) drug therapy: Secondary | ICD-10-CM | POA: Diagnosis not present

## 2018-10-05 DIAGNOSIS — E559 Vitamin D deficiency, unspecified: Secondary | ICD-10-CM | POA: Diagnosis not present

## 2018-10-05 DIAGNOSIS — R5383 Other fatigue: Secondary | ICD-10-CM | POA: Diagnosis not present

## 2018-10-05 DIAGNOSIS — Z1159 Encounter for screening for other viral diseases: Secondary | ICD-10-CM | POA: Diagnosis not present

## 2018-10-15 DIAGNOSIS — M545 Low back pain: Secondary | ICD-10-CM | POA: Diagnosis not present

## 2018-10-15 DIAGNOSIS — E559 Vitamin D deficiency, unspecified: Secondary | ICD-10-CM | POA: Diagnosis not present

## 2018-10-15 DIAGNOSIS — Z1159 Encounter for screening for other viral diseases: Secondary | ICD-10-CM | POA: Diagnosis not present

## 2018-10-15 DIAGNOSIS — E7800 Pure hypercholesterolemia, unspecified: Secondary | ICD-10-CM | POA: Diagnosis not present

## 2018-10-15 DIAGNOSIS — I10 Essential (primary) hypertension: Secondary | ICD-10-CM | POA: Diagnosis not present

## 2018-10-15 DIAGNOSIS — Z79899 Other long term (current) drug therapy: Secondary | ICD-10-CM | POA: Diagnosis not present

## 2018-11-02 DIAGNOSIS — N184 Chronic kidney disease, stage 4 (severe): Secondary | ICD-10-CM | POA: Diagnosis not present

## 2018-11-04 DIAGNOSIS — H5203 Hypermetropia, bilateral: Secondary | ICD-10-CM | POA: Diagnosis not present

## 2018-11-12 DIAGNOSIS — Z79891 Long term (current) use of opiate analgesic: Secondary | ICD-10-CM | POA: Diagnosis not present

## 2018-11-12 DIAGNOSIS — I10 Essential (primary) hypertension: Secondary | ICD-10-CM | POA: Diagnosis not present

## 2018-11-12 DIAGNOSIS — Z79899 Other long term (current) drug therapy: Secondary | ICD-10-CM | POA: Diagnosis not present

## 2018-11-12 DIAGNOSIS — Z1159 Encounter for screening for other viral diseases: Secondary | ICD-10-CM | POA: Diagnosis not present

## 2018-11-12 DIAGNOSIS — E7800 Pure hypercholesterolemia, unspecified: Secondary | ICD-10-CM | POA: Diagnosis not present

## 2018-12-09 DIAGNOSIS — D649 Anemia, unspecified: Secondary | ICD-10-CM | POA: Diagnosis not present

## 2018-12-09 DIAGNOSIS — R609 Edema, unspecified: Secondary | ICD-10-CM | POA: Diagnosis not present

## 2018-12-09 DIAGNOSIS — E7800 Pure hypercholesterolemia, unspecified: Secondary | ICD-10-CM | POA: Diagnosis not present

## 2018-12-09 DIAGNOSIS — I10 Essential (primary) hypertension: Secondary | ICD-10-CM | POA: Diagnosis not present

## 2018-12-09 DIAGNOSIS — M3210 Systemic lupus erythematosus, organ or system involvement unspecified: Secondary | ICD-10-CM | POA: Diagnosis not present

## 2018-12-09 DIAGNOSIS — E119 Type 2 diabetes mellitus without complications: Secondary | ICD-10-CM | POA: Diagnosis not present

## 2018-12-09 DIAGNOSIS — N183 Chronic kidney disease, stage 3 unspecified: Secondary | ICD-10-CM | POA: Diagnosis not present

## 2018-12-09 DIAGNOSIS — Z79891 Long term (current) use of opiate analgesic: Secondary | ICD-10-CM | POA: Diagnosis not present

## 2018-12-09 DIAGNOSIS — M109 Gout, unspecified: Secondary | ICD-10-CM | POA: Diagnosis not present

## 2018-12-09 DIAGNOSIS — R809 Proteinuria, unspecified: Secondary | ICD-10-CM | POA: Diagnosis not present

## 2018-12-09 DIAGNOSIS — D539 Nutritional anemia, unspecified: Secondary | ICD-10-CM | POA: Diagnosis not present

## 2018-12-09 DIAGNOSIS — Z79899 Other long term (current) drug therapy: Secondary | ICD-10-CM | POA: Diagnosis not present

## 2018-12-09 DIAGNOSIS — N189 Chronic kidney disease, unspecified: Secondary | ICD-10-CM | POA: Diagnosis not present

## 2018-12-09 DIAGNOSIS — E785 Hyperlipidemia, unspecified: Secondary | ICD-10-CM | POA: Diagnosis not present

## 2018-12-17 DIAGNOSIS — N281 Cyst of kidney, acquired: Secondary | ICD-10-CM | POA: Diagnosis not present

## 2018-12-17 DIAGNOSIS — I701 Atherosclerosis of renal artery: Secondary | ICD-10-CM | POA: Diagnosis not present

## 2018-12-17 DIAGNOSIS — I10 Essential (primary) hypertension: Secondary | ICD-10-CM | POA: Diagnosis not present

## 2019-01-06 DIAGNOSIS — Z1389 Encounter for screening for other disorder: Secondary | ICD-10-CM | POA: Diagnosis not present

## 2019-01-06 DIAGNOSIS — E559 Vitamin D deficiency, unspecified: Secondary | ICD-10-CM | POA: Diagnosis not present

## 2019-01-06 DIAGNOSIS — E7800 Pure hypercholesterolemia, unspecified: Secondary | ICD-10-CM | POA: Diagnosis not present

## 2019-01-06 DIAGNOSIS — I10 Essential (primary) hypertension: Secondary | ICD-10-CM | POA: Diagnosis not present

## 2019-01-06 DIAGNOSIS — Z79899 Other long term (current) drug therapy: Secondary | ICD-10-CM | POA: Diagnosis not present

## 2019-05-02 ENCOUNTER — Ambulatory Visit: Payer: Medicare Other | Attending: Internal Medicine

## 2019-05-02 DIAGNOSIS — Z23 Encounter for immunization: Secondary | ICD-10-CM

## 2019-05-02 NOTE — Progress Notes (Signed)
   Covid-19 Vaccination Clinic  Name:  MOLLY MASELLI    MRN: [id number] DOB: [birth year]  05/02/2019  Mr. Baca was observed post Covid-19 immunization for 15 minutes without incident. He was provided with Vaccine Information Sheet and instruction to access the V-Safe system.   Mr. Sandoval was instructed to call 911 with any severe reactions post vaccine: [name] Difficulty breathing  . Swelling of face and throat  . A fast heartbeat  . A bad rash all over body  . Dizziness and weakness   Immunizations Administered    Name Date Dose VIS Date Route   Pfizer COVID-19 Vaccine 05/02/2019 10:00 AM 0.3 mL 02/04/2019 Intramuscular   Manufacturer: [place]   Lot: [id number]   [place]: [address]-8

## 2019-06-01 ENCOUNTER — Ambulatory Visit: Payer: Medicare Other | Attending: Internal Medicine

## 2019-06-01 DIAGNOSIS — Z23 Encounter for immunization: Secondary | ICD-10-CM

## 2019-06-01 NOTE — Progress Notes (Signed)
   Covid-19 Vaccination Clinic  Name:  Edward Morgan    MRN: [id number] DOB: 03-06-1950  06/01/2019  [name] was observed post Covid-19 immunization for 15 minutes without incident. He was provided with Vaccine Information Sheet and instruction to access the V-Safe system.   [name] was instructed to call 911 with any severe reactions post vaccine: [name] Difficulty breathing  . Swelling of face and throat  . A fast heartbeat  . A bad rash all over body  . Dizziness and weakness   Immunizations Administered    Name Date Dose VIS Date Route   Pfizer COVID-19 Vaccine 06/01/2019 11:01 AM 0.3 mL 02/04/2019 Intramuscular   Manufacturer: [employer]   Lot: [id number]   [place]: [id number]

## 2019-06-29 ENCOUNTER — Other Ambulatory Visit: Payer: Self-pay | Admitting: Family Medicine

## 2020-06-16 ENCOUNTER — Other Ambulatory Visit: Payer: Self-pay | Admitting: Family Medicine

## 2021-04-23 ENCOUNTER — Other Ambulatory Visit: Payer: Self-pay | Admitting: Family Medicine

## 2021-05-17 ENCOUNTER — Telehealth: Payer: Self-pay

## 2021-05-17 NOTE — Telephone Encounter (Signed)
Patient Edward Morgan on nurse line reporting his missed a call from our office.  ? ?I do not see any notes or recent visits. ? ?I advised patient more than likely a reminder call for apt on 3/29. ?

## 2021-05-22 ENCOUNTER — Other Ambulatory Visit: Payer: Self-pay

## 2021-05-22 ENCOUNTER — Ambulatory Visit (INDEPENDENT_AMBULATORY_CARE_PROVIDER_SITE_OTHER): Payer: Medicare Other | Admitting: Family Medicine

## 2021-05-22 ENCOUNTER — Encounter: Payer: Self-pay | Admitting: Family Medicine

## 2021-05-22 VITALS — BP 189/82 | HR 79 | Wt 214.0 lb

## 2021-05-22 DIAGNOSIS — N2889 Other specified disorders of kidney and ureter: Secondary | ICD-10-CM

## 2021-05-22 DIAGNOSIS — N184 Chronic kidney disease, stage 4 (severe): Secondary | ICD-10-CM

## 2021-05-22 DIAGNOSIS — I10 Essential (primary) hypertension: Secondary | ICD-10-CM

## 2021-05-22 DIAGNOSIS — R413 Other amnesia: Secondary | ICD-10-CM

## 2021-05-22 DIAGNOSIS — F1910 Other psychoactive substance abuse, uncomplicated: Secondary | ICD-10-CM | POA: Diagnosis not present

## 2021-05-22 DIAGNOSIS — F0280 Dementia in other diseases classified elsewhere without behavioral disturbance: Secondary | ICD-10-CM | POA: Insufficient documentation

## 2021-05-22 DIAGNOSIS — F0390 Unspecified dementia without behavioral disturbance: Secondary | ICD-10-CM | POA: Insufficient documentation

## 2021-05-22 NOTE — Assessment & Plan Note (Signed)
>>  ASSESSMENT AND PLAN FOR MEMORY IMPAIRMENT WRITTEN ON 05/22/2021  4:43 PM BY Adeli Frost L, MD  Some concern for dementia symptoms.  Will refer to neurology.  There could also be a component of drug effect.  I asked him to follow-up with his pain medicine/methadone clinic next month and talk to them about his dose.

## 2021-05-22 NOTE — Assessment & Plan Note (Signed)
Continues to follow with the methadone clinic.  I had urged him to check with him about dosing as potentially he could back off a little bit on his dosing that might help his focusing and concentration.  I do not think this is the sum total of his overall cognitive issues however. ?

## 2021-05-22 NOTE — Assessment & Plan Note (Signed)
Elevated pressure today.  He said he was in a hurry and did not take his medicine but he takes it regularly.  I reiterated absolute need for strict adherence to this.  I will have him follow-up with me in 2 to 3 months.  Labs today. ?

## 2021-05-22 NOTE — Assessment & Plan Note (Signed)
We will check some labs today.  He really needs to take blood pressure medicine daily. ?

## 2021-05-22 NOTE — Assessment & Plan Note (Signed)
Some concern for dementia symptoms.  Will refer to neurology.  There could also be a component of drug effect.  I asked him to follow-up with his pain medicine/methadone clinic next month and talk to them about his dose. ?

## 2021-05-22 NOTE — Assessment & Plan Note (Signed)
Was followed for 5 years by urology and they have now told him he needs no further follow-up.  He is excited about this. ?

## 2021-05-22 NOTE — Assessment & Plan Note (Signed)
>>  ASSESSMENT AND PLAN FOR SUBSTANCE ABUSE ([facility]) WRITTEN ON 05/22/2021  4:44 PM BY Hurshel Bouillon L, MD  Continues to follow with the methadone clinic.  I had urged him to check with him about dosing as potentially he could back off a little bit on his dosing that might help his focusing and concentration.  I do not think this is the sum total of his overall cognitive issues however.

## 2021-05-22 NOTE — Patient Instructions (Signed)
See me back in 3 months ?

## 2021-05-22 NOTE — Progress Notes (Signed)
? ? ?  CHIEF COMPLAINT / HPI: ?Here with his daughter for concern about memory changes.  Having difficulty with remembering things.  Has not been driving for some time because his family was concerned.  His neighbor reported that he was standing out in the yard looking confused last week and they called the family.  He said he was getting ready to mow the lawn but then got distracted.  He does admit to forgetting where he put a lot of things.  He has not had any difficulty with everyday activities such as using the microwave, turning the TV off or on.  He does not live alone.  Said his sleep is at baseline. ? ?He continues in the methadone program.  He wonders if this is causing his intermittent mild confusion as he thinks the dose may be too high although the dose is unchanged over the last year. ? ? ?PERTINENT  PMH / PSH: I have reviewed the patient?s medications, allergies, past medical and surgical history, smoking status and updated in the EMR as appropriate. ? ? ?OBJECTIVE: ? BP (!) 189/82   Pulse 79   Wt 214 lb (97.1 kg)   SpO2 98%   BMI 29.02 kg/m?  ?Vital signs reviewed. ?GENERAL: Well-developed, well-nourished, no acute distress. ?CARDIOVASCULAR: Regular rate and rhythm no murmur gallop or rub ?LUNGS: Clear to auscultation bilaterally, no rales or wheeze. Distant breath sounds ?ABDOMEN: Soft positive bowel sounds ?NEURO: No gross focal neurological deficits. ?MSK: Movement of extremity x 4.  Rises from the chair without any assistance.  Gait is slightly wide-based but not antalgic. ?Neck without lymphadenopathy.  No bruits are heard over the carotids. ? ?ASSESSMENT / PLAN: ? ? ?Memory changes ?Some concern for dementia symptoms.  Will refer to neurology.  There could also be a component of drug effect.  I asked him to follow-up with his pain medicine/methadone clinic next month and talk to them about his dose. ? ?Renal mass ?Was followed for 5 years by urology and they have now told him he needs no  further follow-up.  He is excited about this. ? ?Substance abuse ([place]) ?Continues to follow with the methadone clinic.  I had urged him to check with him about dosing as potentially he could back off a little bit on his dosing that might help his focusing and concentration.  I do not think this is the sum total of his overall cognitive issues however. ? ?Chronic kidney disease (CKD), stage IV (severe) ([place]) ?We will check some labs today.  He really needs to take blood pressure medicine daily. ? ?Essential hypertension ?Elevated pressure today.  He said he was in a hurry and did not take his medicine but he takes it regularly.  I reiterated absolute need for strict adherence to this.  I will have him follow-up with me in 2 to 3 months.  Labs today. ?  ?[name] MD ?

## 2021-05-23 LAB — COMPREHENSIVE METABOLIC PANEL
ALT: 13 IU/L (ref 0–44)
AST: 22 IU/L (ref 0–40)
Albumin/Globulin Ratio: 1.8 (ref 1.2–2.2)
Albumin: 4.2 g/dL (ref 3.7–4.7)
Alkaline Phosphatase: 72 IU/L (ref 44–121)
BUN/Creatinine Ratio: 11 (ref 10–24)
BUN: 22 mg/dL (ref 8–27)
Bilirubin Total: <0.2 mg/dL (ref 0.0–1.2)
CO2: 23 mmol/L (ref 20–29)
Calcium: 9.1 mg/dL (ref 8.6–10.2)
Chloride: 107 mmol/L — ABNORMAL HIGH (ref 96–106)
Creatinine, Ser: 1.94 mg/dL — ABNORMAL HIGH (ref 0.76–1.27)
Globulin, Total: 2.4 g/dL (ref 1.5–4.5)
Glucose: 116 mg/dL — ABNORMAL HIGH (ref 70–99)
Potassium: 4 mmol/L (ref 3.5–5.2)
Sodium: 145 mmol/L — ABNORMAL HIGH (ref 134–144)
Total Protein: 6.6 g/dL (ref 6.0–8.5)
eGFR: 36 mL/min/{1.73_m2} — ABNORMAL LOW (ref >59)

## 2021-05-23 LAB — LIPID PANEL
Chol/HDL Ratio: 3.1 ratio (ref 0.0–5.0)
Cholesterol, Total: 133 mg/dL (ref 100–199)
HDL: 43 mg/dL (ref >39)
LDL Chol Calc (NIH): 71 mg/dL (ref 0–99)
Triglycerides: 99 mg/dL (ref 0–149)
VLDL Cholesterol Cal: 19 mg/dL (ref 5–40)

## 2021-05-23 LAB — CBC
Hematocrit: 34.4 % — ABNORMAL LOW (ref 37.5–51.0)
Hemoglobin: 11.5 g/dL — ABNORMAL LOW (ref 13.0–17.7)
MCH: 29.4 pg (ref 26.6–33.0)
MCHC: 33.4 g/dL (ref 31.5–35.7)
MCV: 88 fL (ref 79–97)
Platelets: 235 10*3/uL (ref 150–450)
RBC: 3.91 x10E6/uL — ABNORMAL LOW (ref 4.14–5.80)
RDW: 13.1 % (ref 11.6–15.4)
WBC: 5.1 10*3/uL (ref 3.4–10.8)

## 2021-05-23 LAB — TSH: TSH: 2.8 u[IU]/mL (ref 0.450–4.500)

## 2021-05-23 LAB — VITAMIN B12: Vitamin B-12: 811 pg/mL (ref 232–1245)

## 2021-05-27 ENCOUNTER — Encounter: Payer: Self-pay | Admitting: Family Medicine

## 2021-06-25 ENCOUNTER — Ambulatory Visit: Payer: Self-pay | Admitting: Neurology

## 2021-09-02 ENCOUNTER — Encounter (HOSPITAL_COMMUNITY): Payer: Self-pay

## 2021-09-02 ENCOUNTER — Other Ambulatory Visit: Payer: Self-pay

## 2021-09-02 ENCOUNTER — Emergency Department (HOSPITAL_COMMUNITY): Payer: Medicare Other

## 2021-09-02 ENCOUNTER — Emergency Department (HOSPITAL_COMMUNITY)
Admission: EM | Admit: 2021-09-02 | Discharge: 2021-09-02 | Disposition: A | Discharge status: Home / Self Care | Payer: Medicare Other | Attending: Emergency Medicine | Admitting: Emergency Medicine

## 2021-09-02 DIAGNOSIS — R0789 Other chest pain: Secondary | ICD-10-CM | POA: Diagnosis present

## 2021-09-02 DIAGNOSIS — I10 Essential (primary) hypertension: Secondary | ICD-10-CM | POA: Diagnosis not present

## 2021-09-02 DIAGNOSIS — R0602 Shortness of breath: Secondary | ICD-10-CM | POA: Diagnosis not present

## 2021-09-02 DIAGNOSIS — Z79899 Other long term (current) drug therapy: Secondary | ICD-10-CM | POA: Diagnosis not present

## 2021-09-02 DIAGNOSIS — I493 Ventricular premature depolarization: Secondary | ICD-10-CM | POA: Diagnosis not present

## 2021-09-02 LAB — BASIC METABOLIC PANEL
Anion gap: 11 (ref 5–15)
BUN: 26 mg/dL — ABNORMAL HIGH (ref 8–23)
CO2: 23 mmol/L (ref 22–32)
Calcium: 8.7 mg/dL — ABNORMAL LOW (ref 8.9–10.3)
Chloride: 106 mmol/L (ref 98–111)
Creatinine, Ser: 2.92 mg/dL — ABNORMAL HIGH (ref 0.61–1.24)
GFR, Estimated: 22 mL/min — ABNORMAL LOW (ref >60)
Glucose, Bld: 136 mg/dL — ABNORMAL HIGH (ref 70–99)
Potassium: 3.7 mmol/L (ref 3.5–5.1)
Sodium: 140 mmol/L (ref 135–145)

## 2021-09-02 LAB — TROPONIN I (HIGH SENSITIVITY)
Troponin I (High Sensitivity): 15 ng/L (ref <18)
Troponin I (High Sensitivity): 14 ng/L (ref <18)

## 2021-09-02 LAB — CBC
HCT: 39.5 % (ref 39.0–52.0)
Hemoglobin: 12.4 g/dL — ABNORMAL LOW (ref 13.0–17.0)
MCH: 29.3 pg (ref 26.0–34.0)
MCHC: 31.4 g/dL (ref 30.0–36.0)
MCV: 93.4 fL (ref 80.0–100.0)
Platelets: 243 10*3/uL (ref 150–400)
RBC: 4.23 MIL/uL (ref 4.22–5.81)
RDW: 13.2 % (ref 11.5–15.5)
WBC: 4.8 10*3/uL (ref 4.0–10.5)
nRBC: 0 % (ref 0.0–0.2)

## 2021-09-02 LAB — PHOSPHORUS: Phosphorus: 3.5 mg/dL (ref 2.5–4.6)

## 2021-09-02 LAB — BRAIN NATRIURETIC PEPTIDE: B Natriuretic Peptide: 169.8 pg/mL — ABNORMAL HIGH (ref 0.0–100.0)

## 2021-09-02 LAB — MAGNESIUM: Magnesium: 2.3 mg/dL (ref 1.7–2.4)

## 2021-09-02 MED ORDER — SODIUM CHLORIDE 0.9 % IV BOLUS
500.0000 mL | Freq: Once | INTRAVENOUS | Status: AC
  Administered 2021-09-02: 500 mL via INTRAVENOUS

## 2021-09-02 MED ORDER — ATENOLOL 25 MG PO TABS
50.0000 mg | ORAL_TABLET | Freq: Once | ORAL | Status: AC
  Administered 2021-09-02: 50 mg via ORAL
  Filled 2021-09-02: qty 2

## 2021-09-02 MED ORDER — CLONIDINE HCL 0.2 MG PO TABS
0.2000 mg | ORAL_TABLET | Freq: Once | ORAL | Status: AC
  Administered 2021-09-02: 0.2 mg via ORAL
  Filled 2021-09-02: qty 1

## 2021-09-02 NOTE — ED Provider Triage Note (Signed)
Emergency Medicine Provider Triage Evaluation Note  Edward Morgan , a 71 y.o. male  was evaluated in triage.  Pt complains of chest pains that have been coming and going since Friday or Saturday.  Says that they seem to get worse when he is trying to sit up after laying down.  No associated shortness of breath but some palpitations.  Never seen a cardiologist.  Review of Systems  Positive: Chest tightness and palpitations Negative: Difficulty breathing  Physical Exam  BP (!) 153/87 (BP Location: Left Arm)   Pulse 67   Temp 98.6 F (37 C) (Oral)   Resp 18   Ht 6' (1.829 m)   Wt 99.3 kg   SpO2 99%   BMI 29.70 kg/m  Gen:   Awake, no distress   Resp:  Normal effort  MSK:   Moves extremities without difficulty  Other:  Occasional premature beat  Medical Decision Making  Medically screening exam initiated at 3:36 PM.  Appropriate orders placed.  Dessa Phi was informed that the remainder of the evaluation will be completed by another provider, this initial triage assessment does not replace that evaluation, and the importance of remaining in the ED until their evaluation is complete.    EKG with multiple PVCs.   Rhae Hammock, PA-C 09/02/21 1545

## 2021-09-02 NOTE — Discharge Instructions (Addendum)
You are seen in the ER for chest pain.  The work-up in the emergency room is reassuring.  Your cardiac enzymes appear to be normal.  EKG is not showing any signs of heart damage, but you are having extra beats called PVCs.  If you start having severe palpitation, shortness of breath, severe chest pain, sweating, please return to the emergency room.  If you start feeling dizzy, lightheaded -please return to the ER immediately.  We recommend that he follow-up with your primary care doctor in 3 to 5 days.  Your blood pressure was high.  We recommend that when you get home, you take your night meds including those for blood pressure.

## 2021-09-02 NOTE — ED Provider Notes (Signed)
[facility] EMERGENCY DEPARTMENT Provider Note   CSN: [id number] Arrival date & time: 09/02/21  1516     History  Chief Complaint  Patient presents with   Chest Pain    Edward Morgan is a 71 y.o. male.  HPI     71 year old male comes in with chief complaint of chest pain.  Patient has history of hypertension, stroke, hepatitis.  He has no cardiac history or any lung disease history.  Patient comes in with right-sided chest pain.  Symptoms have been present now for 2 days.  Symptoms are intermittent, typically present when he is upright or walking.  He has had associated shortness of breath and dizziness on occasion.  Other times when he is exerting himself he has not had chest pain.  He occasionally will have chest pain with deep inspiration, but has no associated cough, fevers, chills.  Pt has no hx of PE, DVT and denies any exogenous hormone (testosterone / estrogen) use, long distance travels or surgery in the past 6 weeks, active cancer, recent immobilization.  Review of systems negative for palpitations.  Home Medications Prior to Admission medications   Medication Sig Start Date End Date Taking? Authorizing Provider  amLODipine (NORVASC) 10 MG tablet Take 1 tablet (10 mg total) by mouth at bedtime. Patient taking differently: Take 10 mg by mouth daily. 12/30/17  Yes [name], MD  atenolol (TENORMIN) 100 MG tablet Take 100 mg by mouth daily. 07/09/21  Yes [provider]  Buprenorphine HCl-Naloxone HCl 8-2 MG FILM Place under the tongue 3 (three) times daily. 08/08/21  Yes [provider]  cloNIDine (CATAPRES) 0.1 MG tablet TAKE 1 TABLET TWICE A DAY Patient taking differently: Take 0.1 mg by mouth 2 (two) times daily. 12/30/17  Yes [name], MD  doxazosin (CARDURA) 1 MG tablet Take 1 mg by mouth at bedtime. 08/08/21  Yes [provider]  esomeprazole (NEXIUM) 40 MG capsule Take 1 capsule (40 mg total) by mouth daily at 12  noon. 08/20/18  Yes [name], MD  furosemide (LASIX) 20 MG tablet Take 20 mg by mouth daily. 08/02/21  Yes [provider]  lisinopril (ZESTRIL) 20 MG tablet Take 20 mg by mouth daily. 08/08/21  Yes [provider]  Multiple Vitamin (MULTIVITAMIN WITH MINERALS) TABS tablet Take 1 tablet by mouth daily.   Yes [provider]  pravastatin (PRAVACHOL) 20 MG tablet TAKE 1 TABLET BY MOUTH EVERY DAY 04/28/18  Yes [name], MD  tamsulosin (FLOMAX) 0.4 MG CAPS capsule TAKE ONE CAPSULE BY MOUTH ONCE DAILY 04/25/21  Yes [name], MD  atenolol (TENORMIN) 50 MG tablet Take 1 tablet (50 mg total) by mouth daily. Patient not taking: Reported on 09/02/2021 09/27/18   [name], MD  hydrochlorothiazide (HYDRODIURIL) 25 MG tablet TAKE 1 TABLET BY MOUTH EVERY DAY Patient not taking: Reported on 09/02/2021 08/31/18   [name], MD  quinapril (ACCUPRIL) 20 MG tablet TAKE 1 TABLET TWICE A DAY Patient not taking: Reported on 09/02/2021 12/30/17   [name], MD      Allergies    Penicillins    Review of Systems   Review of Systems  All other systems reviewed and are negative.   Physical Exam Updated Vital Signs BP (!) 190/93   Pulse (!) 45   Temp 98.6 F (37 C) (Oral)   Resp 12   Ht 6' (1.829 m)   Wt 99.3 kg  SpO2 99%   BMI 29.70 kg/m  Physical Exam Vitals and nursing note reviewed.  Constitutional:      Appearance: He is well-developed.  HENT:     Head: Atraumatic.  Eyes:     Extraocular Movements: Extraocular movements intact.     Pupils: Pupils are equal, round, and reactive to light.  Cardiovascular:     Rate and Rhythm: Normal rate.     Heart sounds: Normal heart sounds.  Pulmonary:     Effort: Pulmonary effort is normal.     Breath sounds: No rales.  Musculoskeletal:     Cervical back: Neck supple.     Right lower leg: No tenderness. No edema.     Left lower leg: No tenderness. No edema.  Skin:    General: Skin is warm.  Neurological:     Mental  Status: He is alert and oriented to person, place, and time.     ED Results / Procedures / Treatments   Labs (all labs ordered are listed, but only abnormal results are displayed) Labs Reviewed  BASIC METABOLIC PANEL - Abnormal; Notable for the following components:      Result Value   Glucose, Bld 136 (*)    BUN 26 (*)    Creatinine, Ser 2.92 (*)    Calcium 8.7 (*)    GFR, Estimated 22 (*)    All other components within normal limits  CBC - Abnormal; Notable for the following components:   Hemoglobin 12.4 (*)    All other components within normal limits  BRAIN NATRIURETIC PEPTIDE - Abnormal; Notable for the following components:   B Natriuretic Peptide 169.8 (*)    All other components within normal limits  MAGNESIUM  PHOSPHORUS  TROPONIN I (HIGH SENSITIVITY)  TROPONIN I (HIGH SENSITIVITY)    EKG EKG Interpretation  Date/Time:  Monday September 02 2021 15:35:18 EDT Ventricular Rate:  66 PR Interval:  216 QRS Duration: 140 QT Interval:  472 QTC Calculation: 494 R Axis:   88 Text Interpretation: Sinus rhythm with 1st degree A-V block with Premature supraventricular complexes and Fusion complexes Non-specific intra-ventricular conduction block Minimal voltage criteria for LVH, may be normal variant ( Sokolow-Lyon ) Abnormal ECG When compared with ECG of 18-Mar-2018 13:20, PREVIOUS ECG IS PRESENT Confirmed by Varney Biles [phone]) on 09/02/2021 5:51:41 PM  Radiology DG Chest 2 View  Result Date: 09/02/2021 CLINICAL DATA:  Right chest pain EXAM: CHEST - 2 VIEW COMPARISON:  01/28/2009 FINDINGS: Cardiac size is within normal limits. There is tortuosity thoracic aorta. There are no signs of pulmonary edema or focal pulmonary consolidation. There is interval resolution of large infiltrative right upper lobe. There is no pleural effusion or pneumothorax. Surgical screw is seen in the left scapula. IMPRESSION: No active cardiopulmonary disease. Electronically Signed   By: Elmer Picker M.D.   On: 09/02/2021 16:10    Procedures Procedures    Medications Ordered in ED Medications  cloNIDine (CATAPRES) tablet 0.2 mg (0.2 mg Oral Given 09/02/21 1846)  atenolol (TENORMIN) tablet 50 mg (50 mg Oral Given 09/02/21 1845)  sodium chloride 0.9 % bolus 500 mL (0 mLs Intravenous Stopped 09/02/21 2204)    ED Course/ Medical Decision Making/ A&P                           Medical Decision Making Amount and/or Complexity of Data Reviewed Labs: ordered.  Risk Prescription drug management.   This patient presents to the ED  with chief complaint(s) of chest pain with pertinent past medical history of hypertension, stroke which further complicates the presenting complaint. The complaint involves an extensive differential diagnosis and also carries with it a high risk of complications and morbidity.    The differential diagnosis includes  ACS syndrome Aortic dissection Myocarditis Pericarditis Endocarditis Pericardial effusion / tamponade Pneumonia Pleural effusion / Pulmonary edema PE Pneumothorax Musculoskeletal pain PUD / Gastritis / Esophagitis Esophageal spasm     The initial plan is to get basic labs and trops.  Additional history obtained: Records reviewed previous admission documents and Primary Care Documents  Independent labs interpretation:  The following labs were independently interpreted: Normal CBC, metabolic profile, troponin  Independent visualization of imaging: - I independently visualized the following imaging with scope of interpretation limited to determining acute life threatening conditions related to emergency care: X-ray of the chest, which revealed no evidence of pneumothorax or large pneumonia  Treatment and Reassessment: Patient reassessed.  Results discussed with him.  Troponin x2 negative.  I did note that patient had multiple PVCs.  Sometimes the PVCs will last a few seconds.  Patient is not symptomatic from it however while  in the ER. I wonder that when he was symptomatic, he was having PVCs.  It is unclear at this time if the PVCs need aggressive management.  I advised him to follow-up with his PCP.      Final Clinical Impression(s) / ED Diagnoses Final diagnoses:  Atypical chest pain  Primary hypertension  Frequent PVCs    Rx / DC Orders ED Discharge Orders     None         Varney Biles, MD 09/02/21 2256

## 2021-09-02 NOTE — ED Notes (Signed)
MD is aware that pt has 3-5 runs of PVCs on the monitor. Pt refused IV at this time. Encouraged by RN multiple times. Pt finally agreed and IV on the hand only. 22g PIV to [facility] in place. Cardiac monitoring in place.

## 2021-09-02 NOTE — ED Triage Notes (Signed)
Pt reports right sided chest pain, sob, dizziness, onset Friday. Reports pain is worse when he stands up.

## 2021-10-17 NOTE — Progress Notes (Cosign Needed)
Assessment/Plan:    The patient is seen in neurologic consultation at the request of Huey Romans* for the evaluation of memory.  Edward Morgan is a very pleasant 71 y.o. year old RH male with  a history of hypertension, hyperlipidemia, CKD3, seen today for evaluation of memory loss. MoCA today is 11 /30.  Findings are suspicious for Alzheimer's disease with possible vascular component.  Dementia due to Alzheimer's disease  MRI brain without contrast to assess for underlying structural abnormality and assess vascular load  Start Memantine '10mg'$  tablets. Take 1 tablet at bedtime for 2 weeks, then 1 tablet twice daily. Side effects discussed Check B12, TSH, B1  Folllow up 3 months     Subjective:    The patient is accompanied by his daughter, and his wife is on the phone who supplements the history.    How long did patient have memory difficulties?  Patient reports that his memory issues have been present for the last 2-3 months -however he is wife reports it has been about 2-3 years "if not more ".  He forgets recent conversations, and sometimes he does not answer what he is being asked.  Both short-term and long-term memory are affected. Patient lives with: Spouse  repeats oneself? endorsed, such as "where we going "several times.  "Not so much about telling the same stories " Disoriented when walking into a room? Endorsed.he has shown difficulty finding rooms inside the house such as the bedroom or bathroom.  Sometimes he has "accidents ", thinking that is the bathroom and he urinates in the hallway.   Leaving objects in unusual places? Endorsed.  "He moves stuff everywhere "and wife cannot find it  Ambulates  with difficulty?  Occasionally he has balance problems.  His daughter states that he begins walking, then the steps get smaller, if the distance is long, and then he is at risk of falling.  Occasionally he uses a cane but not all the time. Recent falls? 2 nights ago he  fell from the bed Any head injuries? 6-7 y he had a hemorrhagic stroke.  No apparent residuals according to the family. History of seizures?   Patient denies   Wandering behavior?  Patient denies his wife reports that "he just stands there ". Recently he took the wrong bus. Patient drives?   Patient no longer drives    Any mood changes such irritability agitation?  Endorsed. "He is more hateful that it used to be, especially over the last 2 months"-his wife says Any history of depression?:  denies   Hallucinations?  Patient denies   Paranoia?  Patient denies   Patient reports that sleeps "not well, roams around the house "denies vivid dreams, REM behavior or sleepwalking    History of sleep apnea?  Patient denies   Any hygiene concerns?  Endorsed especially over the last 2 months. He does not want to take a shower  Independent of bathing and dressing?  Endorsed.  His wife lines up the clothing Does the patient needs help with medications?  Wife in charge  Who is in charge of the finances?  Patient is in charge but she needs a POA because it's getting worse.  They are in the process of doing so. Any changes in appetite?  He doesn't  eat like he used to, he may be forgetting to eat  Patient have trouble swallowing? Patient denies   Does the patient cook?  Patient denies   Any kitchen accidents such as  leaving the stove on? He burns my food all the time, I wanna keep him out of the kitchen Any headaches?  Endorsed, occasionally in the forehead "usually do to blood pressure" Double vision? Patient denies   Any focal numbness or tingling?  Patient denies   Chronic back pain Patient denies   Unilateral weakness?  Patient denies   Any tremors?  Patient denies   Any history of anosmia?  For the last 2-3 years  Any incontinence of urine?  At night he urinates 3-4  night, sometimes in the hall" Any bowel dysfunction?   Patient denies   History of heavy alcohol intake?   "He's been a drank all his  life, liquor, but he backed down " History of heavy tobacco use?  Patient denies   Family history of dementia?   Mother  Alzheimer's disease    MRI brain 01/28/11 Hemorrhagic infarction affecting the left basal ganglia.  The hematoma measures 23 x 13 mm by MR.  Small hemorrhagic infarction affecting the posterior body of the caudate on the left. Moderate chronic small vessel changes elsewhere throughout the  brain.   MRA head 01/28/11 No major vessel occlusion. 50% stenosis of the right middle cerebral artery in the 1 day to junction region.   Allergies  Allergen Reactions   Penicillins Other (See Comments)    Did it involve swelling of the face/tongue/throat, SOB, or low BP? Yes Did it involve sudden or severe rash/hives, skin peeling, or any reaction on the inside of your mouth or nose? Yes Did you need to seek medical attention at a hospital or doctor's office? Yes When did it last happen?      childhood If all above answers are "NO", may proceed with cephalosporin use.    Current Outpatient Medications  Medication Instructions   amLODipine (NORVASC) 10 MG tablet Oral   amLODipine (NORVASC) 10 mg, Oral, Daily at bedtime   aspirin EC 81 MG tablet Oral   atenolol (TENORMIN) 50 mg, Oral, Daily   atenolol (TENORMIN) 100 mg, Oral, Daily   Buprenorphine HCl-Naloxone HCl 8-2 MG FILM Sublingual, 3 times daily   cetirizine (ZYRTEC) 10 MG tablet Oral   cloNIDine (CATAPRES) 0.1 MG tablet TAKE 1 TABLET TWICE A DAY   cloNIDine (CATAPRES) 0.2 mg, Oral, 2 times daily   doxazosin (CARDURA) 1 MG tablet Oral   doxazosin (CARDURA) 1 mg, Oral, Daily at bedtime   ergocalciferol (VITAMIN D2) 1.25 MG (50000 UT) capsule Oral   esomeprazole (NEXIUM) 40 mg, Oral, Daily   furosemide (LASIX) 20 mg, Oral, Daily   hydrochlorothiazide (HYDRODIURIL) 25 MG tablet TAKE 1 TABLET BY MOUTH EVERY DAY   lisinopril (ZESTRIL) 20 mg, Oral, Daily   memantine (NAMENDA) 10 MG tablet Take 1 tablet (10 mg at night) for 2  weeks, then increase to 1 tablet (10 mg) twice a day   Multiple Vitamin (MULTIVITAMIN WITH MINERALS) TABS tablet 1 tablet, Oral, Daily   pravastatin (PRAVACHOL) 20 MG tablet TAKE 1 TABLET BY MOUTH EVERY DAY   quinapril (ACCUPRIL) 20 MG tablet TAKE 1 TABLET TWICE A DAY   tamsulosin (FLOMAX) 0.4 MG CAPS capsule TAKE ONE CAPSULE BY MOUTH ONCE DAILY     VITALS:   Vitals:   10/18/21 0751  BP: (!) 145/75  Pulse: 69  Resp: 18  SpO2: 100%  Weight: 197 lb (89.4 kg)  Height: 6' (1.829 m)    PHYSICAL EXAM   HEENT:  Normocephalic, atraumatic. The mucous membranes are moist. The superficial temporal  arteries are without ropiness or tenderness. Cardiovascular: Regular rate and rhythm. Lungs: Clear to auscultation bilaterally. Neck: There are no carotid bruits noted bilaterally.  NEUROLOGICAL:    10/18/2021    8:00 AM  Montreal Cognitive Assessment   Visuospatial/ Executive (0/5) 0  Naming (0/3) 3  Attention: Read list of digits (0/2) 0  Attention: Read list of letters (0/1) 1  Attention: Serial 7 subtraction starting at 100 (0/3) 0  Language: Repeat phrase (0/2) 1  Language : Fluency (0/1) 0  Abstraction (0/2) 0  Delayed Recall (0/5) 0  Orientation (0/6) 5  Total 10  Adjusted Score (based on education) 11        No data to display           Orientation:  Alert and oriented to person, place and time. No aphasia or dysarthria. Fund of knowledge is reduced. Recent memory  and remote memory impaired. Attention and concentration are reduced. Able to name objects and unable to repeat phrases. Delayed recall   Cranial nerves: There is good facial symmetry. Extraocular muscles are intact and visual fields are full to confrontational testing. Speech is fluent and clear. Soft palate rises symmetrically and there is no tongue deviation. Hearing is intact to conversational tone. Tone: Tone is good throughout. Sensation: Sensation is intact to light touch and pinprick throughout. Vibration  is intact at the bilateral big toe.There is no extinction with double simultaneous stimulation. There is no sensory dermatomal level identified. Coordination: The patient has no difficulty with RAM's or FNF bilaterally. Normal finger to nose  Motor: Strength is 5/5 in the bilateral upper and lower extremities. There is no pronator drift. There are no fasciculations noted. DTR's: Deep tendon reflexes are 2/4 at the bilateral biceps, triceps, brachioradialis, patella and achilles.  Plantar responses are downgoing bilaterally. Gait and Station: The patient is able to ambulate without difficulty.The patient is able to heel toe walk without any difficulty.The patient is able to ambulate in a tandem fashion. The patient is able to stand in the Romberg position.     Thank you for allowing [place] the opportunity to participate in the care of this nice patient. Please do not hesitate to contact us for any questions or concerns.   Total time spent on today's visit was 60 minutes dedicated to this patient today, preparing to see patient, examining the patient, ordering tests and/or medications and counseling the patient, documenting clinical information in the EHR or other health record, independently interpreting results and communicating results to the patient/family, discussing treatment and goals, answering patient's questions and coordinating care.  Cc:  [name], MD  Sharene Butters 10/18/2021 12:21 PM

## 2021-10-18 ENCOUNTER — Other Ambulatory Visit (INDEPENDENT_AMBULATORY_CARE_PROVIDER_SITE_OTHER): Payer: Medicare Other

## 2021-10-18 ENCOUNTER — Ambulatory Visit (INDEPENDENT_AMBULATORY_CARE_PROVIDER_SITE_OTHER): Payer: Medicare Other | Admitting: Physician Assistant

## 2021-10-18 ENCOUNTER — Encounter: Payer: Self-pay | Admitting: Physician Assistant

## 2021-10-18 VITALS — BP 145/75 | HR 69 | Resp 18 | Ht 72.0 in | Wt 197.0 lb

## 2021-10-18 DIAGNOSIS — R413 Other amnesia: Secondary | ICD-10-CM

## 2021-10-18 DIAGNOSIS — G301 Alzheimer's disease with late onset: Secondary | ICD-10-CM

## 2021-10-18 DIAGNOSIS — F02B18 Dementia in other diseases classified elsewhere, moderate, with other behavioral disturbance: Secondary | ICD-10-CM

## 2021-10-18 DIAGNOSIS — F0280 Dementia in other diseases classified elsewhere without behavioral disturbance: Secondary | ICD-10-CM | POA: Insufficient documentation

## 2021-10-18 DIAGNOSIS — F01B18 Vascular dementia, moderate, with other behavioral disturbance: Secondary | ICD-10-CM

## 2021-10-18 LAB — VITAMIN B12: Vitamin B-12: 761 pg/mL (ref 211–911)

## 2021-10-18 LAB — TSH: TSH: 1.4 u[IU]/mL (ref 0.35–5.50)

## 2021-10-18 MED ORDER — MEMANTINE HCL 10 MG PO TABS
ORAL_TABLET | ORAL | 11 refills | Status: DC
Start: 2021-10-18 — End: 2022-01-20

## 2021-10-18 NOTE — Progress Notes (Signed)
Thyroid and B12 levels are normal, thanks

## 2021-10-18 NOTE — Patient Instructions (Addendum)
It was a pleasure to see you today at our office.   Recommendations:  Follow up in 3 months  Start Memantine 10 mg: Take 1 tablet (10 mg at night) for 2 weeks, then increase to 1 tablet (10 mg) twice a day.    Check labs today  MRI brain   Whom to call:  Memory  decline, memory medications: Call our office [phone]   For psychiatric meds, mood meds: Please have your primary care physician manage these medications.   Counseling regarding caregiver distress, including caregiver depression, anxiety and issues regarding community resources, adult day care programs, adult living facilities, or memory care questions:   Feel free to contact [place], Social Worker at [phone]   For assessment of decision of mental capacity and competency:  Call Dr. Anthoney Harada, geriatric psychiatrist at [phone]  For guidance in geriatric dementia issues please call [facility] [phone]  For guidance regarding [facility] and if placement were needed at the facility, contact Arnell Asal, Social Worker tel: [phone]  If you have any severe symptoms of a stroke, or other severe issues such as confusion,severe chills or fever, etc call 911 or go to the ER as you may need to be evaluated further   Feel free to visit Facebook page " [email or web address]" for tips of how to care for people with memory problems.   RECOMMENDATIONS FOR ALL PATIENTS WITH MEMORY PROBLEMS: 1. Continue to exercise (Recommend 30 minutes of walking everyday, or 3 hours every week) 2. Increase social interactions - continue going to [place] and enjoy social gatherings with friends and family 3. Eat healthy, avoid fried foods and eat more fruits and vegetables 4. Maintain adequate blood pressure, blood sugar, and blood cholesterol level. Reducing the risk of stroke and cardiovascular disease also helps promoting better memory. 5. Avoid stressful situations. Live a simple life and avoid  aggravations. Organize your time and prepare for the next day in anticipation. 6. Sleep well, avoid any interruptions of sleep and avoid any distractions in the bedroom that may interfere with adequate sleep quality 7. Avoid sugar, avoid sweets as there is a strong link between excessive sugar intake, diabetes, and cognitive impairment We discussed the Mediterranean diet, which has been shown to help patients reduce the risk of progressive memory disorders and reduces cardiovascular risk. This includes eating fish, eat fruits and green leafy vegetables, nuts like almonds and hazelnuts, walnuts, and also use olive oil. Avoid fast foods and fried foods as much as possible. Avoid sweets and sugar as sugar use has been linked to worsening of memory function.  There is always a concern of gradual progression of memory problems. If this is the case, then we may need to adjust level of care according to patient needs. Support, both to the patient and caregiver, should then be put into place.    The Alzheimer's Association is here all day, every day for people facing Alzheimer's disease through our free 24/7 Helpline: [phone]. The Helpline provides reliable information and support to all those who need assistance, such as individuals living with memory loss, Alzheimer's or other dementia, caregivers, health care professionals and the public.  Our highly trained and knowledgeable staff can help you with: Understanding memory loss, dementia and Alzheimer's  Medications and other treatment options  General information about aging and brain health  Skills to provide quality care and to find the best care from professionals  Legal, financial and living-arrangement decisions Our Helpline also features:  Confidential care consultation provided by master's level clinicians who can help with decision-making support, crisis assistance and education on issues families face every day  Help in a caller's preferred  language using our translation service that features more than 200 languages and dialects  Referrals to local community programs, services and ongoing support     FALL PRECAUTIONS: Be cautious when walking. Scan the area for obstacles that may increase the risk of trips and falls. When getting up in the mornings, sit up at the edge of the bed for a few minutes before getting out of bed. Consider elevating the bed at the head end to avoid drop of blood pressure when getting up. Walk always in a well-lit room (use night lights in the walls). Avoid area rugs or power cords from appliances in the middle of the walkways. Use a walker or a cane if necessary and consider physical therapy for balance exercise. Get your eyesight checked regularly.  FINANCIAL OVERSIGHT: Supervision, especially oversight when making financial decisions or transactions is also recommended.  HOME SAFETY: Consider the safety of the kitchen when operating appliances like stoves, microwave oven, and blender. Consider having supervision and share cooking responsibilities until no longer able to participate in those. Accidents with firearms and other hazards in the house should be identified and addressed as well.   ABILITY TO BE LEFT ALONE: If patient is unable to contact 911 operator, consider using LifeLine, or when the need is there, arrange for someone to stay with patients. Smoking is a fire hazard, consider supervision or cessation. Risk of wandering should be assessed by caregiver and if detected at any point, supervision and safe proof recommendations should be instituted.  MEDICATION SUPERVISION: Inability to self-administer medication needs to be constantly addressed. Implement a mechanism to ensure safe administration of the medications.   DRIVING: Regarding driving, in patients with progressive memory problems, driving will be impaired. We advise to have someone else do the driving if trouble finding directions or if  minor accidents are reported. Independent driving assessment is available to determine safety of driving.   If you are interested in the driving assessment, you can contact the following:  [employer] in [place]  [place] [phone] or [phone]      [place] refers to food and lifestyle choices that are based on the traditions of countries located on the [employer]. This way of eating has been shown to help prevent certain conditions and improve outcomes for people who have chronic diseases, like kidney disease and heart disease. What are tips for following this plan? Lifestyle  Cook and eat meals together with your family, when possible. Drink enough fluid to keep your urine clear or pale yellow. Be physically active every day. This includes: Aerobic exercise like running or swimming. Leisure activities like gardening, walking, or housework. Get 7-8 hours of sleep each night. If recommended by your health care provider, drink red wine in moderation. This means 1 glass a day for nonpregnant women and 2 glasses a day for men. A glass of wine equals 5 oz (150 mL). Reading food labels  Check the serving size of packaged foods. For foods such as rice and pasta, the serving size refers to the amount of cooked product, not dry. Check the total fat in packaged foods. Avoid foods that have saturated fat or trans fats. Check the ingredients list for added sugars, such  as corn syrup. Shopping  At the grocery store, buy most of your food from the areas near the walls of the store. This includes: Fresh fruits and vegetables (produce). Grains, beans, nuts, and seeds. Some of these may be available in unpackaged forms or large amounts (in bulk). Fresh seafood. Poultry and eggs. Low-fat dairy products. Buy whole ingredients instead of  prepackaged foods. Buy fresh fruits and vegetables in-season from local farmers markets. Buy frozen fruits and vegetables in resealable bags. If you do not have access to quality fresh seafood, buy precooked frozen shrimp or canned fish, such as tuna, salmon, or sardines. Buy small amounts of raw or cooked vegetables, salads, or olives from the deli or salad bar at your store. Stock your pantry so you always have certain foods on hand, such as olive oil, canned tuna, canned tomatoes, rice, pasta, and beans. Cooking  Cook foods with extra-virgin olive oil instead of using butter or other vegetable oils. Have meat as a side dish, and have vegetables or grains as your main dish. This means having meat in small portions or adding small amounts of meat to foods like pasta or stew. Use beans or vegetables instead of meat in common dishes like chili or lasagna. Experiment with different cooking methods. Try roasting or broiling vegetables instead of steaming or sauteing them. Add frozen vegetables to soups, stews, pasta, or rice. Add nuts or seeds for added healthy fat at each meal. You can add these to yogurt, salads, or vegetable dishes. Marinate fish or vegetables using olive oil, lemon juice, garlic, and fresh herbs. Meal planning  Plan to eat 1 vegetarian meal one day each week. Try to work up to 2 vegetarian meals, if possible. Eat seafood 2 or more times a week. Have healthy snacks readily available, such as: Vegetable sticks with hummus. Greek yogurt. Fruit and nut trail mix. Eat balanced meals throughout the week. This includes: Fruit: 2-3 servings a day Vegetables: 4-5 servings a day Low-fat dairy: 2 servings a day Fish, poultry, or lean meat: 1 serving a day Beans and legumes: 2 or more servings a week Nuts and seeds: 1-2 servings a day Whole grains: 6-8 servings a day Extra-virgin olive oil: 3-4 servings a day Limit red meat and sweets to only a few servings a month What are my  food choices? Mediterranean diet Recommended Grains: Whole-grain pasta. Brown rice. Bulgar wheat. Polenta. Couscous. Whole-wheat bread. [place]. Vegetables: Artichokes. Beets. Broccoli. Cabbage. Carrots. Eggplant. Green beans. Chard. Kale. Spinach. Onions. Leeks. Peas. Squash. Tomatoes. Peppers. Radishes. Fruits: Apples. Apricots. Avocado. Berries. Bananas. Cherries. Dates. Figs. Grapes. Lemons. Melon. Oranges. Peaches. Plums. Pomegranate. Meats and other protein foods: Beans. Almonds. Sunflower seeds. Pine nuts. Peanuts. [place]. Salmon. Scallops. Shrimp. [place]. Tilapia. Clams. Oysters. Eggs. Dairy: Low-fat milk. Cheese. Greek yogurt. Beverages: Water. Red wine. Herbal tea. Fats and oils: Extra virgin olive oil. Avocado oil. Grape seed oil. Sweets and desserts: [place] yogurt with honey. Baked apples. Poached pears. Trail mix. Seasoning and other foods: Basil. Cilantro. Coriander. Cumin. Mint. Parsley. Sage. Rosemary. Tarragon. Garlic. Oregano. Thyme. Pepper. Balsalmic vinegar. Tahini. Hummus. Tomato sauce. Olives. Mushrooms. Limit these Grains: Prepackaged pasta or rice dishes. Prepackaged cereal with added sugar. Vegetables: Deep fried potatoes (french fries). Fruits: Fruit canned in syrup. Meats and other protein foods: Beef. Pork. Lamb. Poultry with skin. Hot dogs. [place] Salines. Dairy: Ice cream. Sour cream. Whole milk. Beverages: Juice. Sugar-sweetened soft drinks. Beer. Liquor and spirits. Fats and oils: Butter. Canola oil. Vegetable oil. Beef fat (tallow).  Lard. Sweets and desserts: Cookies. Cakes. Pies. Candy. Seasoning and other foods: Mayonnaise. Premade sauces and marinades. The items listed may not be a complete list. Talk with your dietitian about what dietary choices are right for you. Summary The Mediterranean diet includes both food and lifestyle choices. Eat a variety of fresh fruits and vegetables, beans, nuts, seeds, and whole grains. Limit the amount of red meat and sweets  that you eat. Talk with your health care provider about whether it is safe for you to drink red wine in moderation. This means 1 glass a day for nonpregnant women and 2 glasses a day for men. A glass of wine equals 5 oz (150 mL). This information is not intended to replace advice given to you by your health care provider. Make sure you discuss any questions you have with your health care provider. Document Released: 10/04/2015 Document Revised: 11/06/2015 Document Reviewed: 10/04/2015 Elsevier Interactive Patient Education  2017 [employer].   We have sent a referral to [facility] for your MRI and they will call you directly to schedule your appointment. They are located at [place]. If you need to contact them directly please call [phone].  Your provider has requested that you have labwork completed today. Please go to [facility] ([id number]) on the second floor of this building before leaving the office today. You do not need to check in. If you are not called within 15 minutes please check with the front desk.

## 2021-10-24 ENCOUNTER — Ambulatory Visit
Admission: RE | Admit: 2021-10-24 | Discharge: 2021-10-24 | Disposition: A | Discharge status: Home / Self Care | Payer: Medicare Other | Source: Ambulatory Visit | Attending: Physician Assistant | Admitting: Physician Assistant

## 2021-10-24 ENCOUNTER — Telehealth: Payer: Self-pay | Admitting: Physician Assistant

## 2021-10-24 NOTE — Telephone Encounter (Signed)
Patient daughter states that Clarise Cruz was going to hand write the DX and stuff of her and she wants to know if it is done  please call

## 2021-10-26 LAB — VITAMIN B1: Vitamin B1 (Thiamine): 22 nmol/L (ref 8–30)

## 2021-10-28 NOTE — Progress Notes (Signed)
B1 is normal, thanks

## 2021-10-29 ENCOUNTER — Telehealth: Payer: Self-pay | Admitting: Physician Assistant

## 2021-10-29 NOTE — Telephone Encounter (Signed)
Patients daughter would like to know his results of his MRI

## 2021-10-30 ENCOUNTER — Ambulatory Visit (INDEPENDENT_AMBULATORY_CARE_PROVIDER_SITE_OTHER): Payer: Medicare Other | Admitting: Family Medicine

## 2021-10-30 ENCOUNTER — Encounter: Payer: Self-pay | Admitting: Family Medicine

## 2021-10-30 DIAGNOSIS — G301 Alzheimer's disease with late onset: Secondary | ICD-10-CM

## 2021-10-30 DIAGNOSIS — G894 Chronic pain syndrome: Secondary | ICD-10-CM

## 2021-10-30 DIAGNOSIS — F02B18 Dementia in other diseases classified elsewhere, moderate, with other behavioral disturbance: Secondary | ICD-10-CM

## 2021-10-30 DIAGNOSIS — N184 Chronic kidney disease, stage 4 (severe): Secondary | ICD-10-CM

## 2021-10-30 NOTE — Assessment & Plan Note (Addendum)
Recent labs look okay.  We will continue to follow those.

## 2021-10-30 NOTE — Assessment & Plan Note (Signed)
I reviewed the findings from his MRI with him, his daughter and his wife who was on the phone.  I agreed it medication the neurologist prescribed is appropriate.  I will contact home health/personal care services and see if we can get increased time for him to be supervised.  Also told him and his daughter and his wife that at some point he was likely going to need some type of increased supervisory care that might include locked Alzheimer's unit etc.  Right now he is not to drive, is not to operate heavy machinery and should not be unsupervised for cooking etc.  He will continue follow-up with neurology.

## 2021-10-30 NOTE — Assessment & Plan Note (Signed)
Continue follow-up with the Suboxone clinic.

## 2021-10-30 NOTE — Progress Notes (Signed)
    CHIEF COMPLAINT / HPI: Brought in by his daughter for follow-up after seeing the neurologist for dementia.  They have some questions.  Did not the neurologist also recommended follow-up for his blood pressure.   PERTINENT  PMH / PSH: I have reviewed the patient's medications, allergies, past medical and surgical history, smoking status and updated in the EMR as appropriate.   OBJECTIVE:  BP 136/63   Pulse (!) 58   Ht 6' (1.829 m)   Wt 200 lb (90.7 kg)   SpO2 100%   BMI 27.12 kg/m  GENERAL: Well-developed male no acute distress. CV: Regular rate and rhythm MSK: Rises from a chair without problem.  Gait is slightly wide-based but he ambulates without assistance. PSYCH: He is alert and oriented to person and place.  Mood seems congruent.  He has normal speech content and fluency.  Denies any hallucinations.  His understanding of conversational discussion and specifically of his diagnosis of dementia and the findings of his MRI is somewhat limited.  ASSESSMENT / PLAN:   DAT (dementia of Alzheimer type) ([place]) I reviewed the findings from his MRI with him, his daughter and his wife who was on the phone.  I agreed it medication the neurologist prescribed is appropriate.  I will contact home health/personal care services and see if we can get increased time for him to be supervised.  Also told him and his daughter and his wife that at some point he was likely going to need some type of increased supervisory care that might include locked Alzheimer's unit etc.  Right now he is not to drive, is not to operate heavy machinery and should not be unsupervised for cooking etc.  He will continue follow-up with neurology.  Chronic kidney disease (CKD), stage IV (severe) ([place]) Recent labs look okay.  We will continue to follow those.  Chronic pain syndrome on Suboxone Continue follow-up with the Suboxone clinic. His daughter had some questions about Suboxone and dementia and I told her to talk  with his Suboxone clinic about that as they are the specialist and that.  Alternatively she could also bring it up with the neurologist.  See him back for follow-up in 3 months. [name] MD

## 2021-10-30 NOTE — Patient Instructions (Signed)
The MRI shows findings consistent with dementia as we discussed.  You do have some chronic changes consistent with vascular dementia and there may be some overlying Alzheimer's as well, but either way I would just lump it altogether and states dementia.  The neurologist has placed you on the appropriate medicine.  I agree that you should not drive, should not operate heavy machinery, should not cook.  It would be best if you had supervision most of the time.  I will send a request to personal care services to see if we can get your time increased.  At some point, we will likely need to be considered for assisted living.  Your blood pressure is under good control today.  Regarding your Suboxone, you will need to follow-up with that clinic.  I do not know much about the interplay of Suboxone and dimension I will leave that to them as they are specialist in that area.  Great to see you!  I would recommend seeing you back just for regular follow-up in about 3 or 4 months.

## 2021-10-30 NOTE — Telephone Encounter (Signed)
MRI of the brain shows chronic changes likely due to vascular disease, chronic old small stroke, no acute findings.  There is mild atrophy in the brain.  Recommend good control of cardiovascular system blood pressure, cholesterol and sugars.

## 2021-11-04 NOTE — Telephone Encounter (Signed)
Advised of results, voiced understanding

## 2021-11-14 ENCOUNTER — Telehealth: Payer: Self-pay

## 2021-11-14 NOTE — Telephone Encounter (Signed)
Patients wife calls nurse line for (2) reasons.   (1) Hoyle Sauer calls checking the status of personal care services form. I see this mentioned in last office visit. However, unsure of form status.  (2) Hoyle Sauer is requesting a letter from PCP stating she has sole access to his money. Hoyle Sauer reports he has been giving money away to people. She states the bank will not accept POA paperwork. They will only accept a letter from PCP.   Wynona Dove PCP is out on of the office this week.   Will forward to PCP.

## 2021-11-19 NOTE — Telephone Encounter (Signed)
Spoke with family.  They are requesting additional services from [place].   PCS form needs to be filled out and sent to [facility] for additional hours.   Will placed order in your box.

## 2021-11-25 NOTE — Telephone Encounter (Signed)
I have filled out the form to best of my ability. Can you put a copy in his chart. I have placed it in your wall box.  THANKS! [name]

## 2021-11-27 ENCOUNTER — Other Ambulatory Visit: Payer: Self-pay | Admitting: Family Medicine

## 2021-11-27 NOTE — Telephone Encounter (Signed)
Faxed to [facility]. Copy made and placed in batch scanning.   Talbot Grumbling, RN

## 2021-12-31 NOTE — Telephone Encounter (Signed)
Patient's daughter returns call to nurse line regarding PCS forms. Medicaid now uses [employer] for PCS services.   Called and spoke with representative. Form will need to be faxed to new number.   Faxed to [phone].  Talbot Grumbling, RN

## 2022-01-20 ENCOUNTER — Ambulatory Visit (INDEPENDENT_AMBULATORY_CARE_PROVIDER_SITE_OTHER): Payer: Medicare Other | Admitting: Physician Assistant

## 2022-01-20 ENCOUNTER — Encounter: Payer: Self-pay | Admitting: Physician Assistant

## 2022-01-20 VITALS — BP 189/78 | HR 93 | Resp 18 | Ht 73.0 in | Wt 201.0 lb

## 2022-01-20 DIAGNOSIS — R413 Other amnesia: Secondary | ICD-10-CM

## 2022-01-20 MED ORDER — MEMANTINE HCL 10 MG PO TABS
ORAL_TABLET | ORAL | 11 refills | Status: DC
Start: 2022-01-20 — End: 2023-02-09

## 2022-01-20 NOTE — Patient Instructions (Signed)
It was a pleasure to see you today at our office.   Recommendations:  Follow up in 6  months  Continue memantine 1 tablet (10 mg) twice a day.      Whom to call:  Memory  decline, memory medications: Call our office [phone]   For psychiatric meds, mood meds: Please have your primary care physician manage these medications.     For assessment of decision of mental capacity and competency:  Call Dr. Anthoney Harada, geriatric psychiatrist at [phone]  For guidance in geriatric dementia issues please call [facility] [phone]  For guidance regarding [facility] and if placement were needed at the facility, contact Arnell Asal, Social Worker tel: [phone]  If you have any severe symptoms of a stroke, or other severe issues such as confusion,severe chills or fever, etc call 911 or go to the ER as you may need to be evaluated further   Feel free to visit Facebook page " [email or web address]" for tips of how to care for people with memory problems.   RECOMMENDATIONS FOR ALL PATIENTS WITH MEMORY PROBLEMS: 1. Continue to exercise (Recommend 30 minutes of walking everyday, or 3 hours every week) 2. Increase social interactions - continue going to [place] and enjoy social gatherings with friends and family 3. Eat healthy, avoid fried foods and eat more fruits and vegetables 4. Maintain adequate blood pressure, blood sugar, and blood cholesterol level. Reducing the risk of stroke and cardiovascular disease also helps promoting better memory. 5. Avoid stressful situations. Live a simple life and avoid aggravations. Organize your time and prepare for the next day in anticipation. 6. Sleep well, avoid any interruptions of sleep and avoid any distractions in the bedroom that may interfere with adequate sleep quality 7. Avoid sugar, avoid sweets as there is a strong link between excessive sugar intake, diabetes, and cognitive impairment We discussed the  Mediterranean diet, which has been shown to help patients reduce the risk of progressive memory disorders and reduces cardiovascular risk. This includes eating fish, eat fruits and green leafy vegetables, nuts like almonds and hazelnuts, walnuts, and also use olive oil. Avoid fast foods and fried foods as much as possible. Avoid sweets and sugar as sugar use has been linked to worsening of memory function.  There is always a concern of gradual progression of memory problems. If this is the case, then we may need to adjust level of care according to patient needs. Support, both to the patient and caregiver, should then be put into place.    The Alzheimer's Association is here all day, every day for people facing Alzheimer's disease through our free 24/7 Helpline: [phone]. The Helpline provides reliable information and support to all those who need assistance, such as individuals living with memory loss, Alzheimer's or other dementia, caregivers, health care professionals and the public.  Our highly trained and knowledgeable staff can help you with: Understanding memory loss, dementia and Alzheimer's  Medications and other treatment options  General information about aging and brain health  Skills to provide quality care and to find the best care from professionals  Legal, financial and living-arrangement decisions Our Helpline also features: Confidential care consultation provided by master's level clinicians who can help with decision-making support, crisis assistance and education on issues families face every day  Help in a caller's preferred language using our translation service that features more than 200 languages and dialects  Referrals to local community programs, services and ongoing support  FALL PRECAUTIONS: Be cautious when walking. Scan the area for obstacles that may increase the risk of trips and falls. When getting up in the mornings, sit up at the edge of the bed for a  few minutes before getting out of bed. Consider elevating the bed at the head end to avoid drop of blood pressure when getting up. Walk always in a well-lit room (use night lights in the walls). Avoid area rugs or power cords from appliances in the middle of the walkways. Use a walker or a cane if necessary and consider physical therapy for balance exercise. Get your eyesight checked regularly.  FINANCIAL OVERSIGHT: Supervision, especially oversight when making financial decisions or transactions is also recommended.  HOME SAFETY: Consider the safety of the kitchen when operating appliances like stoves, microwave oven, and blender. Consider having supervision and share cooking responsibilities until no longer able to participate in those. Accidents with firearms and other hazards in the house should be identified and addressed as well.   ABILITY TO BE LEFT ALONE: If patient is unable to contact 911 operator, consider using LifeLine, or when the need is there, arrange for someone to stay with patients. Smoking is a fire hazard, consider supervision or cessation. Risk of wandering should be assessed by caregiver and if detected at any point, supervision and safe proof recommendations should be instituted.  MEDICATION SUPERVISION: Inability to self-administer medication needs to be constantly addressed. Implement a mechanism to ensure safe administration of the medications.   DRIVING: Regarding driving, in patients with progressive memory problems, driving will be impaired. We advise to have someone else do the driving if trouble finding directions or if minor accidents are reported. Independent driving assessment is available to determine safety of driving.   If you are interested in the driving assessment, you can contact the following:  [employer] in [place]  [place]  [phone] or [phone]      [place] refers to food and lifestyle choices that are based on the traditions of countries located on the [employer]. This way of eating has been shown to help prevent certain conditions and improve outcomes for people who have chronic diseases, like kidney disease and heart disease. What are tips for following this plan? Lifestyle  Cook and eat meals together with your family, when possible. Drink enough fluid to keep your urine clear or pale yellow. Be physically active every day. This includes: Aerobic exercise like running or swimming. Leisure activities like gardening, walking, or housework. Get 7-8 hours of sleep each night. If recommended by your health care provider, drink red wine in moderation. This means 1 glass a day for nonpregnant women and 2 glasses a day for men. A glass of wine equals 5 oz (150 mL). Reading food labels  Check the serving size of packaged foods. For foods such as rice and pasta, the serving size refers to the amount of cooked product, not dry. Check the total fat in packaged foods. Avoid foods that have saturated fat or trans fats. Check the ingredients list for added sugars, such as corn syrup. Shopping  At the grocery store, buy most of your food from the areas near the walls of the store. This includes: Fresh fruits and vegetables (produce). Grains, beans, nuts, and seeds. Some of these may be available in unpackaged forms or large amounts (in bulk). Fresh seafood. Poultry and eggs. Low-fat dairy products.  Buy whole ingredients instead of prepackaged foods. Buy fresh fruits and vegetables in-season from local farmers markets. Buy frozen fruits and vegetables in resealable bags. If you do not have access to quality fresh seafood, buy precooked frozen shrimp or canned fish, such as tuna, salmon, or sardines. Buy small amounts of raw or cooked vegetables, salads, or olives from the  deli or salad bar at your store. Stock your pantry so you always have certain foods on hand, such as olive oil, canned tuna, canned tomatoes, rice, pasta, and beans. Cooking  Cook foods with extra-virgin olive oil instead of using butter or other vegetable oils. Have meat as a side dish, and have vegetables or grains as your main dish. This means having meat in small portions or adding small amounts of meat to foods like pasta or stew. Use beans or vegetables instead of meat in common dishes like chili or lasagna. Experiment with different cooking methods. Try roasting or broiling vegetables instead of steaming or sauteing them. Add frozen vegetables to soups, stews, pasta, or rice. Add nuts or seeds for added healthy fat at each meal. You can add these to yogurt, salads, or vegetable dishes. Marinate fish or vegetables using olive oil, lemon juice, garlic, and fresh herbs. Meal planning  Plan to eat 1 vegetarian meal one day each week. Try to work up to 2 vegetarian meals, if possible. Eat seafood 2 or more times a week. Have healthy snacks readily available, such as: Vegetable sticks with hummus. Greek yogurt. Fruit and nut trail mix. Eat balanced meals throughout the week. This includes: Fruit: 2-3 servings a day Vegetables: 4-5 servings a day Low-fat dairy: 2 servings a day Fish, poultry, or lean meat: 1 serving a day Beans and legumes: 2 or more servings a week Nuts and seeds: 1-2 servings a day Whole grains: 6-8 servings a day Extra-virgin olive oil: 3-4 servings a day Limit red meat and sweets to only a few servings a month What are my food choices? Mediterranean diet Recommended Grains: Whole-grain pasta. Brown rice. Bulgar wheat. Polenta. Couscous. Whole-wheat bread. [place]. Vegetables: Artichokes. Beets. Broccoli. Cabbage. Carrots. Eggplant. Green beans. Chard. Kale. Spinach. Onions. Leeks. Peas. Squash. Tomatoes. Peppers. Radishes. Fruits: Apples. Apricots.  Avocado. Berries. Bananas. Cherries. Dates. Figs. Grapes. Lemons. Melon. Oranges. Peaches. Plums. Pomegranate. Meats and other protein foods: Beans. Almonds. Sunflower seeds. Pine nuts. Peanuts. [place]. Salmon. Scallops. Shrimp. [place]. Tilapia. Clams. Oysters. Eggs. Dairy: Low-fat milk. Cheese. Greek yogurt. Beverages: Water. Red wine. Herbal tea. Fats and oils: Extra virgin olive oil. Avocado oil. Grape seed oil. Sweets and desserts: [place] yogurt with honey. Baked apples. Poached pears. Trail mix. Seasoning and other foods: Basil. Cilantro. Coriander. Cumin. Mint. Parsley. Sage. Rosemary. Tarragon. Garlic. Oregano. Thyme. Pepper. Balsalmic vinegar. Tahini. Hummus. Tomato sauce. Olives. Mushrooms. Limit these Grains: Prepackaged pasta or rice dishes. Prepackaged cereal with added sugar. Vegetables: Deep fried potatoes (french fries). Fruits: Fruit canned in syrup. Meats and other protein foods: Beef. Pork. Lamb. Poultry with skin. Hot dogs. Berniece Salines. Dairy: Ice cream. Sour cream. Whole milk. Beverages: Juice. Sugar-sweetened soft drinks. Beer. Liquor and spirits. Fats and oils: Butter. Canola oil. Vegetable oil. Beef fat (tallow). Lard. Sweets and desserts: Cookies. Cakes. Pies. Candy. Seasoning and other foods: Mayonnaise. Premade sauces and marinades. The items listed may not be a complete list. Talk with your dietitian about what dietary choices are right for you. Summary The Mediterranean diet includes both food and lifestyle choices. Eat a variety of fresh fruits and vegetables, beans,  nuts, seeds, and whole grains. Limit the amount of red meat and sweets that you eat. Talk with your health care provider about whether it is safe for you to drink red wine in moderation. This means 1 glass a day for nonpregnant women and 2 glasses a day for men. A glass of wine equals 5 oz (150 mL). This information is not intended to replace advice given to you by your health care provider. Make sure you discuss  any questions you have with your health care provider. Document Released: 10/04/2015 Document Revised: 11/06/2015 Document Reviewed: 10/04/2015 Elsevier Interactive Patient Education  2017 [employer].   We have sent a referral to [facility] for your MRI and they will call you directly to schedule your appointment. They are located at [place]. If you need to contact them directly please call [phone].  Your provider has requested that you have labwork completed today. Please go to [facility] ([id number]) on the second floor of this building before leaving the office today. You do not need to check in. If you are not called within 15 minutes please check with the front desk.

## 2022-01-20 NOTE — Progress Notes (Signed)
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 RH male with  a history of hypertension, hyperlipidemia, CKD3  seen today in follow up for memory loss. Patient is currently on memantine 10 mg twice daily, tolerating well.  10/24/2021 MRI brain personally reviewed was remarkable for mild chronic small vessel ischemic disease progressive symptoms prior MRI in 2012, repeat demonstrated chronic lacunar infarct within the right aspect of the pons, and mild generalized parenchymal atrophy.  Since his last visit, him and his family report no significant changes regarding his memory.  He has stopped his alcohol consumption, he feels more awake and alert since then.      Follow up in  6 months. Continue memantine 10 mg twice daily, side effects discussed    Subjective:    This patient is accompanied in the office by his daughter who supplements the history.  Previous records as well as any outside records available were reviewed prior to todays visit.  He was last seen on 10/18/2021, at which time his MoCA was 11/30.   Any changes in memory since last visit?  "About the same ", forgets some conversations and names of people I meet, "but able to remember a face " repeats oneself?  "He has done that all the time, mostly asking the same questions " Disoriented when walking into a room? Endorsed, sometimes he has difficulty finding rooms inside the house such as the bedroom or bathroom. Leaving objects in unusual places?  Endorsed "and then he cannot find them" Ambulates  with difficulty?   Patient denies   Recent falls?  Last week had a mechanical fall, lost balance "because he does not use cane frequently " Any recent head injuries?  Patient denies   History of seizures?   Patient denies   Wandering behavior? He took the wrong bus, 1 month ago family decided to drive him to destinations, he no longer  drives. Any mood changes since last visit?  "He is more painful than he used to be "according to his wife, but not worse  any worsening depression?:  Patient denies   Hallucinations?  Patient denies   Paranoia?  Patient denies   Patient reports that sleeps better than before,  without vivid dreams, REM behavior or sleepwalking   History of sleep apnea?  Patient denies   Any hygiene concerns?  He has to be reminded.  Independent of bathing and dressing?  Endorsed  Does the patient needs help with medications?  Wife in charge  Who is in charge of the finances?  Wife is in charge, she is now the POA   Any changes in appetite?  Patient denies   Patient have trouble swallowing? Patient denies   Does the patient cook?  Patient denies   Any kitchen accidents such as leaving the stove on? Patient denies   Any headaches?  "Only when the blood pressure is high " Double vision? Patient denies   Any focal numbness or tingling?  Patient denies   Chronic back pain Patient denies   Unilateral weakness?  Patient denies   Any tremors?  Patient denies   Any history of anosmia?  He has a history of anosmia for the last 3 years Any incontinence of urine?  He urinates 3-4 times a night, sometimes in the hallway Any bowel dysfunction?   Patient denies      Patient lives with: wife  NO longer taking alcohol !  Initial visit 10/18/2021 How long did patient have memory difficulties?  Patient reports that his memory issues have been present for the last 2-3 months -however he is wife reports it has been about 2-3 years "if not more ".  He forgets recent conversations, and sometimes he does not answer what he is being asked.  Both short-term and long-term memory are affected. Patient lives with: Spouse  repeats oneself? endorsed, such as "where we going "several times.  "Not so much about telling the same stories " Disoriented when walking into a room? Endorsed.he has shown difficulty finding rooms inside the  house such as the bedroom or bathroom.  Sometimes he has "accidents ", thinking that is the bathroom and he urinates in the hallway.   Leaving objects in unusual places? Endorsed.  "He moves stuff everywhere "and wife cannot find it  Ambulates  with difficulty?  Occasionally he has balance problems.  His daughter states that he begins walking, then the steps get smaller, if the distance is long, and then he is at risk of falling.  Occasionally he uses a cane but not all the time. Recent falls? 2 nights ago he fell from the bed Any head injuries? 6-7 y he had a hemorrhagic stroke.  No apparent residuals according to the family. History of seizures?   Patient denies   Wandering behavior?  Patient denies his wife reports that "he just stands there ". Recently he took the wrong bus. Patient drives?   Patient no longer drives    Any mood changes such irritability agitation?  Endorsed. "He is more hateful that it used to be, especially over the last 2 months"-his wife says Any history of depression?:  denies   Hallucinations?  Patient denies   Paranoia?  Patient denies   Patient reports that sleeps "not well, roams around the house "denies vivid dreams, REM behavior or sleepwalking    History of sleep apnea?  Patient denies   Any hygiene concerns?  Endorsed especially over the last 2 months. He does not want to take a shower  Independent of bathing and dressing?  Endorsed.  His wife lines up the clothing Does the patient needs help with medications?  Wife in charge  Who is in charge of the finances?  Patient is in charge but she needs a POA because it's getting worse.  They are in the process of doing so. Any changes in appetite?  He doesn't  eat like he used to, he may be forgetting to eat  Patient have trouble swallowing? Patient denies   Does the patient cook?  Patient denies   Any kitchen accidents such as leaving the stove on? He burns my food all the time, I wanna keep him out of the  kitchen Any headaches?  Endorsed, occasionally in the forehead "usually do to blood pressure" Double vision? Patient denies   Any focal numbness or tingling?  Patient denies   Chronic back pain Patient denies   Unilateral weakness?  Patient denies   Any tremors?  Patient denies   Any history of anosmia?  For the last 2-3 years  Any incontinence of urine?  At night he urinates 3-4  night, sometimes in the hall" Any bowel dysfunction?   Patient denies   History of heavy alcohol intake?   "He's been a drank all his life, liquor, but he backed down " History of heavy tobacco use?  Patient denies   Family history of dementia?  Mother  Alzheimer's disease        10/24/2021 MRI brain personally reviewed was remarkable for mild chronic small vessel ischemic disease progressive symptoms prior MRI in 2012, repeat demonstrated chronic lacunar infarct within the right aspect of the pons, and mild generalized parenchymal atrophy. MRI brain 01/28/11 Hemorrhagic infarction affecting the left basal ganglia.  The hematoma measures 23 x 13 mm by MR.  Small hemorrhagic infarction affecting the posterior body of the caudate on the left. Moderate chronic small vessel changes elsewhere throughout the  brain.    MRA head 01/28/11 No major vessel occlusion. 50% stenosis of the right middle cerebral artery in the 1 day to junction region.  PREVIOUS MEDICATIONS:   CURRENT MEDICATIONS:  Outpatient Encounter Medications as of 01/20/2022  Medication Sig   amLODipine (NORVASC) 10 MG tablet Take 1 tablet (10 mg total) by mouth at bedtime. (Patient taking differently: Take 10 mg by mouth daily.)   aspirin EC 81 MG tablet Take by mouth.   atenolol (TENORMIN) 100 MG tablet Take 100 mg by mouth daily.   Buprenorphine HCl-Naloxone HCl 8-2 MG FILM Place under the tongue 3 (three) times daily.   cetirizine (ZYRTEC) 10 MG tablet Take by mouth.   cloNIDine (CATAPRES) 0.1 MG tablet TAKE 1 TABLET TWICE A DAY (Patient taking  differently: Take 0.1 mg by mouth 2 (two) times daily.)   doxazosin (CARDURA) 1 MG tablet Take 1 mg by mouth at bedtime.   esomeprazole (NEXIUM) 40 MG capsule TAKE ONE CAPSULE BY MOUTH ONCE DAILY   furosemide (LASIX) 20 MG tablet TAKE 1 (ONE) TABLET DAILY FOR FLUID   lisinopril (ZESTRIL) 20 MG tablet Take 20 mg by mouth daily.   Multiple Vitamin (MULTIVITAMIN WITH MINERALS) TABS tablet Take 1 tablet by mouth daily.   pravastatin (PRAVACHOL) 20 MG tablet TAKE ONE TABLET BY MOUTH ONCE DAILY FOR CHOLESTEROL   tamsulosin (FLOMAX) 0.4 MG CAPS capsule TAKE ONE CAPSULE BY MOUTH ONCE DAILY   [DISCONTINUED] memantine (NAMENDA) 10 MG tablet Take 1 tablet (10 mg at night) for 2 weeks, then increase to 1 tablet (10 mg) twice a day   memantine (NAMENDA) 10 MG tablet Take 1 tablet twice a day   No facility-administered encounter medications on file as of 01/20/2022.        No data to display            10/18/2021    8:00 AM  Montreal Cognitive Assessment   Visuospatial/ Executive (0/5) 0  Naming (0/3) 3  Attention: Read list of digits (0/2) 0  Attention: Read list of letters (0/1) 1  Attention: Serial 7 subtraction starting at 100 (0/3) 0  Language: Repeat phrase (0/2) 1  Language : Fluency (0/1) 0  Abstraction (0/2) 0  Delayed Recall (0/5) 0  Orientation (0/6) 5  Total 10  Adjusted Score (based on education) 11    Objective:     PHYSICAL EXAMINATION:    VITALS:   Vitals:   01/20/22 0903  BP: (!) 189/78  Pulse: 93  Resp: 18  SpO2: 100%  Weight: 201 lb (91.2 kg)  Height: '6\' 1"'$  (1.854 m)    GEN:  The patient appears stated age and is in NAD. HEENT:  Normocephalic, atraumatic.   Neurological examination:  General: NAD, well-groomed, appears stated age. Orientation: The patient is alert. Oriented to person, place and not to date Cranial nerves: There is good facial symmetry.The speech is fluent and clear. No aphasia or dysarthria. Fund of knowledge is appropriate.  Recent and  remote memory are impaired. Attention and concentration are reduced.  Able to name objects and repeat phrases.  Hearing is intact to conversational tone.    Sensation: Sensation is intact to light touch throughout Motor: Strength is at least antigravity x4. Tremors: none  DTR's 2/4 in UE/LE     Movement examination: Tone: There is normal tone in the UE/LE Abnormal movements:  no tremor.  No myoclonus.  No asterixis.   Coordination:  There is no decremation with RAM's. Normal finger to nose  Gait and Station: The patient has no difficulty arising out of a deep-seated chair without the use of the hands. The patient's stride length is good.  Gait is cautious and narrow.    Thank you for allowing [place] the opportunity to participate in the care of this nice patient. Please do not hesitate to contact us for any questions or concerns.   Total time spent on today's visit was 24 minutes dedicated to this patient today, preparing to see patient, examining the patient, ordering tests and/or medications and counseling the patient, documenting clinical information in the EHR or other health record, independently interpreting results and communicating results to the patient/family, discussing treatment and goals, answering patient's questions and coordinating care.  Cc:  [name], MD  Sharene Butters 01/20/2022 9:36 AM

## 2022-01-29 ENCOUNTER — Ambulatory Visit (INDEPENDENT_AMBULATORY_CARE_PROVIDER_SITE_OTHER): Payer: Medicare Other | Admitting: Family Medicine

## 2022-01-29 ENCOUNTER — Encounter: Payer: Self-pay | Admitting: Family Medicine

## 2022-01-29 VITALS — BP 136/66 | HR 89 | Ht 72.0 in | Wt 198.6 lb

## 2022-01-29 DIAGNOSIS — B351 Tinea unguium: Secondary | ICD-10-CM

## 2022-01-29 DIAGNOSIS — M7742 Metatarsalgia, left foot: Secondary | ICD-10-CM | POA: Diagnosis not present

## 2022-01-29 DIAGNOSIS — M7741 Metatarsalgia, right foot: Secondary | ICD-10-CM

## 2022-01-29 DIAGNOSIS — N184 Chronic kidney disease, stage 4 (severe): Secondary | ICD-10-CM | POA: Diagnosis not present

## 2022-01-29 DIAGNOSIS — I10 Essential (primary) hypertension: Secondary | ICD-10-CM

## 2022-01-29 NOTE — Patient Instructions (Signed)
Going to check some blood work today because it is probably time to do that.  I do not think the darkening skin on your feet is of any concern.  Sometimes that just happens as we get older.  You also have some thickening and deformity of the nails and I think it would be a good idea for you to see the podiatrist.  I have put a referral in for that.  If you do not hear in the next 2 weeks, please let me know.  I sent the referral to [facility].  I will send you a note about your lab work.  Have a great holiday!

## 2022-01-30 DIAGNOSIS — M7741 Metatarsalgia, right foot: Secondary | ICD-10-CM | POA: Insufficient documentation

## 2022-01-30 DIAGNOSIS — B351 Tinea unguium: Secondary | ICD-10-CM | POA: Insufficient documentation

## 2022-01-30 LAB — COMPREHENSIVE METABOLIC PANEL
ALT: 14 IU/L (ref 0–44)
AST: 22 IU/L (ref 0–40)
Albumin/Globulin Ratio: 1.7 (ref 1.2–2.2)
Albumin: 4.3 g/dL (ref 3.8–4.8)
Alkaline Phosphatase: 96 IU/L (ref 44–121)
BUN/Creatinine Ratio: 16 (ref 10–24)
BUN: 49 mg/dL — ABNORMAL HIGH (ref 8–27)
Bilirubin Total: 0.4 mg/dL (ref 0.0–1.2)
CO2: 24 mmol/L (ref 20–29)
Calcium: 9.4 mg/dL (ref 8.6–10.2)
Chloride: 103 mmol/L (ref 96–106)
Creatinine, Ser: 3.1 mg/dL — ABNORMAL HIGH (ref 0.76–1.27)
Globulin, Total: 2.6 g/dL (ref 1.5–4.5)
Glucose: 95 mg/dL (ref 70–99)
Potassium: 3.7 mmol/L (ref 3.5–5.2)
Sodium: 144 mmol/L (ref 134–144)
Total Protein: 6.9 g/dL (ref 6.0–8.5)
eGFR: 20 mL/min/{1.73_m2} — ABNORMAL LOW (ref >59)

## 2022-01-30 NOTE — Assessment & Plan Note (Signed)
Recheck labs 

## 2022-01-30 NOTE — Assessment & Plan Note (Signed)
His daughter today tells me he has been occasionally complaining of some foot pain right greater than left in the metatarsal area.  He has no tenderness there today and today he denies having any pain although that may be related to some of his memory issues.  Podiatry referral.

## 2022-01-30 NOTE — Assessment & Plan Note (Signed)
Clearly he cannot manage his toenails and needs addressed.  Will refer to podiatry.

## 2022-01-30 NOTE — Progress Notes (Signed)
    CHIEF COMPLAINT / HPI: Here with his daughter for evaluation of discoloration on the feet.  Michela Pitcher the family just noticed it recently that he has really dark skin on his feet.  Also been having a little bit of lower extremity swelling for quite some time that does not seem to have gotten worse recently.  He said he cut his tongue nails last month but has difficulty cutting them.  He otherwise reports feeling well.   PERTINENT  PMH / PSH: I have reviewed the patient's medications, allergies, past medical and surgical history, smoking status and updated in the EMR as appropriate.   OBJECTIVE:  BP 136/66   Pulse 89   Ht 6' (1.829 m)   Wt 198 lb 9.6 oz (90.1 kg)   SpO2 97%   BMI 26.94 kg/m  GENERAL: Well-developed male no acute distress EXTREMITY: Bilateral feet have slight nonpitting edema.  They do have some hyperpigmentation on the dorsum.  TOENAILS: Quite thickened extremely long and deformed.  Evidence of onychomycosis. NEURO: Some decrease in soft touch sensation bilateral feet up to the level of the ankle. Vascular: Dorsalis pedis pulses slightly palpable symmetrically.  ASSESSMENT / PLAN:   Metatarsalgia of both feet His daughter today tells me he has been occasionally complaining of some foot pain right greater than left in the metatarsal area.  He has no tenderness there today and today he denies having any pain although that may be related to some of his memory issues.  Podiatry referral.  Onychomycosis Clearly he cannot manage his toenails and needs addressed.  Will refer to podiatry.  Chronic kidney disease (CKD), stage IV (severe) (HCC) Recheck labs   [name] MD

## 2022-01-31 ENCOUNTER — Other Ambulatory Visit: Payer: Self-pay | Admitting: Family Medicine

## 2022-01-31 ENCOUNTER — Encounter: Payer: Self-pay | Admitting: Family Medicine

## 2022-01-31 DIAGNOSIS — N184 Chronic kidney disease, stage 4 (severe): Secondary | ICD-10-CM

## 2022-02-05 DIAGNOSIS — M545 Low back pain, unspecified: Secondary | ICD-10-CM | POA: Diagnosis not present

## 2022-02-05 DIAGNOSIS — Z79899 Other long term (current) drug therapy: Secondary | ICD-10-CM | POA: Diagnosis not present

## 2022-02-05 DIAGNOSIS — Z91199 Patient's noncompliance with other medical treatment and regimen due to unspecified reason: Secondary | ICD-10-CM | POA: Diagnosis not present

## 2022-02-05 DIAGNOSIS — Z5181 Encounter for therapeutic drug level monitoring: Secondary | ICD-10-CM | POA: Diagnosis not present

## 2022-02-05 DIAGNOSIS — I10 Essential (primary) hypertension: Secondary | ICD-10-CM | POA: Diagnosis not present

## 2022-02-05 DIAGNOSIS — G8929 Other chronic pain: Secondary | ICD-10-CM | POA: Diagnosis not present

## 2022-02-07 DIAGNOSIS — Z79899 Other long term (current) drug therapy: Secondary | ICD-10-CM | POA: Diagnosis not present

## 2022-02-12 ENCOUNTER — Ambulatory Visit (INDEPENDENT_AMBULATORY_CARE_PROVIDER_SITE_OTHER): Payer: Medicare Other | Admitting: Podiatry

## 2022-02-12 VITALS — BP 140/72

## 2022-02-12 DIAGNOSIS — M79675 Pain in left toe(s): Secondary | ICD-10-CM

## 2022-02-12 DIAGNOSIS — M79674 Pain in right toe(s): Secondary | ICD-10-CM

## 2022-02-12 DIAGNOSIS — L853 Xerosis cutis: Secondary | ICD-10-CM | POA: Diagnosis not present

## 2022-02-12 DIAGNOSIS — B351 Tinea unguium: Secondary | ICD-10-CM | POA: Diagnosis not present

## 2022-02-12 MED ORDER — AMMONIUM LACTATE 12 % EX LOTN: 1.0000 | TOPICAL_LOTION | CUTANEOUS | 0 refills | Status: DC | PRN

## 2022-02-12 NOTE — Progress Notes (Signed)
  Subjective:  Patient ID: NAT LOWENTHAL, male    DOB: [birth year],  MRN: [id number]  Chief Complaint  Patient presents with   Nail Problem    Thick discolored nails    71 y.o. male returns for the above complaint.  Patient presents with thickened elongated dystrophic toenails x 10 mild pain on palpation hurts with ambulation worse with pressure.  Objective:   Vitals:   02/12/22 0818  BP: (!) 140/72   Podiatric Exam: Vascular: dorsalis pedis and posterior tibial pulses are palpable bilateral. Capillary return is immediate. Temperature gradient is WNL. Skin turgor WNL  Sensorium: Normal Semmes Weinstein monofilament test. Normal tactile sensation bilaterally. Nail Exam: Pt has thick disfigured discolored nails with subungual debris noted bilateral entire nail hallux through fifth toenails.  Pain on palpation to the nails. Ulcer Exam: There is no evidence of ulcer or pre-ulcerative changes or infection. Orthopedic Exam: Muscle tone and strength are WNL. No limitations in general ROM. No crepitus or effusions noted.  Skin: No Porokeratosis. No infection or ulcers.  Dry skin noted to both lower extremity no fissures or cracking noted.  Moderate dry skin noted    Assessment & Plan:   1. Pain due to onychomycosis of toenails of both feet   2. Xerosis of skin     Patient was evaluated and treated and all questions answered.  Xerosis skin bilateral -I explained to the patient the etiology of xerosis and various treatment options were extensively discussed.  I explained to the patient the importance of maintaining moisturization of the skin with application of over-the-counter lotion such as Eucerin or Luciderm.  Given the patient has failed over-the-counter medication he will benefit from ammonium lactate ammonium lactate was sent to the pharmacy.   Onychomycosis with pain  -Nails palliatively debrided as below. -Educated on self-care  Procedure: Nail Debridement Rationale: pain   Type of Debridement: manual, sharp debridement. Instrumentation: Nail nipper, rotary burr. Number of Nails: 10  Procedures and Treatment: Consent by patient was obtained for treatment procedures. The patient understood the discussion of treatment and procedures well. All questions were answered thoroughly reviewed. Debridement of mycotic and hypertrophic toenails, 1 through 5 bilateral and clearing of subungual debris. No ulceration, no infection noted.  Return Visit-Office Procedure: Patient instructed to return to the office for a follow up visit 3 months for continued evaluation and treatment.  Boneta Lucks, DPM    Return in about 3 months (around 05/14/2022) for [facility].

## 2022-03-07 DIAGNOSIS — Z0000 Encounter for general adult medical examination without abnormal findings: Secondary | ICD-10-CM | POA: Diagnosis not present

## 2022-03-07 DIAGNOSIS — M545 Low back pain, unspecified: Secondary | ICD-10-CM | POA: Diagnosis not present

## 2022-03-07 DIAGNOSIS — Z91199 Patient's noncompliance with other medical treatment and regimen due to unspecified reason: Secondary | ICD-10-CM | POA: Diagnosis not present

## 2022-03-07 DIAGNOSIS — E7800 Pure hypercholesterolemia, unspecified: Secondary | ICD-10-CM | POA: Diagnosis not present

## 2022-03-07 DIAGNOSIS — Z5181 Encounter for therapeutic drug level monitoring: Secondary | ICD-10-CM | POA: Diagnosis not present

## 2022-03-07 DIAGNOSIS — N1832 Chronic kidney disease, stage 3b: Secondary | ICD-10-CM | POA: Diagnosis not present

## 2022-03-07 DIAGNOSIS — G8929 Other chronic pain: Secondary | ICD-10-CM | POA: Diagnosis not present

## 2022-03-07 DIAGNOSIS — I10 Essential (primary) hypertension: Secondary | ICD-10-CM | POA: Diagnosis not present

## 2022-03-07 DIAGNOSIS — Z79899 Other long term (current) drug therapy: Secondary | ICD-10-CM | POA: Diagnosis not present

## 2022-03-11 ENCOUNTER — Other Ambulatory Visit: Payer: Self-pay | Admitting: Family Medicine

## 2022-04-07 DIAGNOSIS — Z79899 Other long term (current) drug therapy: Secondary | ICD-10-CM | POA: Diagnosis not present

## 2022-04-07 DIAGNOSIS — Z5181 Encounter for therapeutic drug level monitoring: Secondary | ICD-10-CM | POA: Diagnosis not present

## 2022-04-07 DIAGNOSIS — G8929 Other chronic pain: Secondary | ICD-10-CM | POA: Diagnosis not present

## 2022-04-07 DIAGNOSIS — E7800 Pure hypercholesterolemia, unspecified: Secondary | ICD-10-CM | POA: Diagnosis not present

## 2022-04-07 DIAGNOSIS — Z91199 Patient's noncompliance with other medical treatment and regimen due to unspecified reason: Secondary | ICD-10-CM | POA: Diagnosis not present

## 2022-04-07 DIAGNOSIS — M545 Low back pain, unspecified: Secondary | ICD-10-CM | POA: Diagnosis not present

## 2022-04-07 DIAGNOSIS — I10 Essential (primary) hypertension: Secondary | ICD-10-CM | POA: Diagnosis not present

## 2022-04-10 DIAGNOSIS — Z79899 Other long term (current) drug therapy: Secondary | ICD-10-CM | POA: Diagnosis not present

## 2022-04-28 DIAGNOSIS — G309 Alzheimer's disease, unspecified: Secondary | ICD-10-CM | POA: Diagnosis not present

## 2022-04-28 DIAGNOSIS — N184 Chronic kidney disease, stage 4 (severe): Secondary | ICD-10-CM | POA: Diagnosis not present

## 2022-04-28 DIAGNOSIS — I129 Hypertensive chronic kidney disease with stage 1 through stage 4 chronic kidney disease, or unspecified chronic kidney disease: Secondary | ICD-10-CM | POA: Diagnosis not present

## 2022-05-07 DIAGNOSIS — I10 Essential (primary) hypertension: Secondary | ICD-10-CM | POA: Diagnosis not present

## 2022-05-07 DIAGNOSIS — M545 Low back pain, unspecified: Secondary | ICD-10-CM | POA: Diagnosis not present

## 2022-05-07 DIAGNOSIS — Z79899 Other long term (current) drug therapy: Secondary | ICD-10-CM | POA: Diagnosis not present

## 2022-05-07 DIAGNOSIS — N1832 Chronic kidney disease, stage 3b: Secondary | ICD-10-CM | POA: Diagnosis not present

## 2022-05-07 DIAGNOSIS — E7800 Pure hypercholesterolemia, unspecified: Secondary | ICD-10-CM | POA: Diagnosis not present

## 2022-05-07 DIAGNOSIS — Z5181 Encounter for therapeutic drug level monitoring: Secondary | ICD-10-CM | POA: Diagnosis not present

## 2022-05-07 DIAGNOSIS — G8929 Other chronic pain: Secondary | ICD-10-CM | POA: Diagnosis not present

## 2022-05-09 DIAGNOSIS — Z79899 Other long term (current) drug therapy: Secondary | ICD-10-CM | POA: Diagnosis not present

## 2022-05-15 DIAGNOSIS — H40022 Open angle with borderline findings, high risk, left eye: Secondary | ICD-10-CM | POA: Diagnosis not present

## 2022-05-15 DIAGNOSIS — H5203 Hypermetropia, bilateral: Secondary | ICD-10-CM | POA: Diagnosis not present

## 2022-05-15 DIAGNOSIS — H524 Presbyopia: Secondary | ICD-10-CM | POA: Diagnosis not present

## 2022-05-15 DIAGNOSIS — H2513 Age-related nuclear cataract, bilateral: Secondary | ICD-10-CM | POA: Diagnosis not present

## 2022-05-15 DIAGNOSIS — H52223 Regular astigmatism, bilateral: Secondary | ICD-10-CM | POA: Diagnosis not present

## 2022-05-20 ENCOUNTER — Ambulatory Visit (INDEPENDENT_AMBULATORY_CARE_PROVIDER_SITE_OTHER): Payer: 59 | Admitting: Podiatrist

## 2022-05-20 DIAGNOSIS — Z91199 Patient's noncompliance with other medical treatment and regimen due to unspecified reason: Secondary | ICD-10-CM

## 2022-05-23 ENCOUNTER — Other Ambulatory Visit: Payer: Self-pay | Admitting: Family Medicine

## 2022-05-26 NOTE — Progress Notes (Signed)
   Complete physical exam  Patient: Edward Edward   DOB: 12/14/2000   72 y.o. Male  MRN: [id number]  Subjective:    No chief complaint on file.   Edward Edward is a 72 y.o. male who presents today for a complete physical exam. She reports consuming a {diet types:17450} diet. {types:19826} She generally feels {DESC; WELL/FAIRLY WELL/POORLY:18703}. She reports sleeping {DESC; WELL/FAIRLY WELL/POORLY:18703}. She {does/does not:200015} have additional problems to discuss today.    Most recent fall risk assessment:    08/21/2021   10:42 AM  Fall Risk   Falls in the past year? 0  Number falls in past yr: 0  Injury with Fall? 0  Risk for fall due to : No Fall Risks  Follow up Falls evaluation completed     Most recent depression screenings:    08/21/2021   10:42 AM 07/12/2020   10:46 AM  PHQ 2/9 Scores  PHQ - 2 Score 0 0  PHQ- 9 Score 5     {VISON DENTAL STD PSA (Optional):27386}  {History (Optional):23778}  Patient Care Team: Jessup, Joy, NP as PCP - General (Nurse Practitioner)   Outpatient Medications Prior to Visit  Medication Sig   fluticasone (FLONASE) 50 MCG/ACT nasal spray Place 2 sprays into both nostrils in the morning and at bedtime. After 7 days, reduce to once daily.   norgestimate-ethinyl estradiol (SPRINTEC 28) 0.25-35 MG-MCG tablet Take 1 tablet by mouth daily.   Nystatin POWD Apply liberally to affected area 2 times per day   spironolactone (ALDACTONE) 100 MG tablet Take 1 tablet (100 mg total) by mouth daily.   No facility-administered medications prior to visit.    ROS        Objective:     There were no vitals taken for this visit. {Vitals History (Optional):23777}  Physical Exam   No results found for any visits on 09/26/21. {Show previous labs (optional):23779}    Assessment & Plan:    Routine Health Maintenance and Physical Exam  Immunization History  Administered Date(s) Administered   DTaP 02/27/1999, 04/25/1999,  07/04/1999, 03/19/2000, 10/03/2003   Hepatitis A 07/30/2007, 08/04/2008   Hepatitis B 12/15/1998, 01/22/1999, 07/04/1999   HiB (PRP-OMP) 02/27/1999, 04/25/1999, 07/04/1999, 03/19/2000   IPV 02/27/1999, 04/25/1999, 12/23/1999, 10/03/2003   Influenza,inj,Quad PF,6+ Mos 11/04/2013   Influenza-Unspecified 02/04/2012   MMR 12/22/2000, 10/03/2003   Meningococcal Polysaccharide 08/04/2011   Pneumococcal Conjugate-13 03/19/2000   Pneumococcal-Unspecified 07/04/1999, 09/17/1999   Tdap 08/04/2011   Varicella 12/23/1999, 07/30/2007    Health Maintenance  Topic Date Due   HIV Screening  Never done   Hepatitis C Screening  Never done   INFLUENZA VACCINE  09/24/2021   PAP-Cervical Cytology Screening  09/26/2021 (Originally 12/14/2019)   PAP SMEAR-Modifier  09/26/2021 (Originally 12/14/2019)   TETANUS/TDAP  09/26/2021 (Originally 08/03/2021)   HPV VACCINES  Discontinued   COVID-19 Vaccine  Discontinued    Discussed health benefits of physical activity, and encouraged her to engage in regular exercise appropriate for her age and condition.  Problem List Items Addressed This Visit   None Visit Diagnoses     Annual physical exam    -  Primary   Cervical cancer screening       Need for Tdap vaccination          No follow-ups on file.     Joy Jessup, NP   

## 2022-06-06 DIAGNOSIS — Z91199 Patient's noncompliance with other medical treatment and regimen due to unspecified reason: Secondary | ICD-10-CM | POA: Diagnosis not present

## 2022-06-06 DIAGNOSIS — M545 Low back pain, unspecified: Secondary | ICD-10-CM | POA: Diagnosis not present

## 2022-06-06 DIAGNOSIS — G8929 Other chronic pain: Secondary | ICD-10-CM | POA: Diagnosis not present

## 2022-06-06 DIAGNOSIS — Z79899 Other long term (current) drug therapy: Secondary | ICD-10-CM | POA: Diagnosis not present

## 2022-06-06 DIAGNOSIS — E7800 Pure hypercholesterolemia, unspecified: Secondary | ICD-10-CM | POA: Diagnosis not present

## 2022-06-06 DIAGNOSIS — I10 Essential (primary) hypertension: Secondary | ICD-10-CM | POA: Diagnosis not present

## 2022-06-06 DIAGNOSIS — N1832 Chronic kidney disease, stage 3b: Secondary | ICD-10-CM | POA: Diagnosis not present

## 2022-06-06 DIAGNOSIS — Z5181 Encounter for therapeutic drug level monitoring: Secondary | ICD-10-CM | POA: Diagnosis not present

## 2022-06-09 ENCOUNTER — Ambulatory Visit: Payer: 59 | Admitting: Podiatry

## 2022-06-11 ENCOUNTER — Ambulatory Visit (INDEPENDENT_AMBULATORY_CARE_PROVIDER_SITE_OTHER): Payer: 59 | Admitting: Podiatry

## 2022-06-11 DIAGNOSIS — B351 Tinea unguium: Secondary | ICD-10-CM

## 2022-06-11 DIAGNOSIS — M79674 Pain in right toe(s): Secondary | ICD-10-CM

## 2022-06-11 DIAGNOSIS — M79675 Pain in left toe(s): Secondary | ICD-10-CM

## 2022-06-11 DIAGNOSIS — N184 Chronic kidney disease, stage 4 (severe): Secondary | ICD-10-CM | POA: Diagnosis not present

## 2022-06-11 DIAGNOSIS — Z79899 Other long term (current) drug therapy: Secondary | ICD-10-CM | POA: Diagnosis not present

## 2022-06-11 DIAGNOSIS — M21371 Foot drop, right foot: Secondary | ICD-10-CM

## 2022-06-11 NOTE — Progress Notes (Signed)
This patient returns to my office for at risk foot care.  This patient requires this care by a professional since this patient will be at risk due to having dementia, right foot drop and CKD. This patient is unable to cut nails himself since the patient cannot reach his nails.These nails are painful walking and wearing shoes.  This patient presents for at risk foot care today.  General Appearance  Alert, conversant and in no acute stress.  Vascular  Dorsalis pedis and posterior tibial  pulses are palpable  bilaterally.  Capillary return is within normal limits  bilaterally. Temperature is within normal limits  bilaterally.  Neurologic  Senn-Weinstein monofilament wire test within normal limits  bilaterally. Muscle power within normal limits bilaterally.  Nails Thick disfigured discolored nails with subungual debris  from hallux to fifth toes bilaterally. No evidence of bacterial infection or drainage bilaterally.  Orthopedic  No limitations of motion  feet .  No crepitus or effusions noted.  No bony pathology or digital deformities noted.  Skin  normotropic skin with no porokeratosis noted bilaterally.  No signs of infections or ulcers noted.     Onychomycosis  Pain in right toes  Pain in left toes  Consent was obtained for treatment procedures.   Mechanical debridement of nails 1-5  bilaterally performed with a nail nipper.  Filed with dremel without incident.    Return office visit     3 months                 Told patient to return for periodic foot care and evaluation due to potential at risk complications.   Helane Gunther DPM

## 2022-07-07 DIAGNOSIS — I10 Essential (primary) hypertension: Secondary | ICD-10-CM | POA: Diagnosis not present

## 2022-07-07 DIAGNOSIS — Z79899 Other long term (current) drug therapy: Secondary | ICD-10-CM | POA: Diagnosis not present

## 2022-07-07 DIAGNOSIS — N1832 Chronic kidney disease, stage 3b: Secondary | ICD-10-CM | POA: Diagnosis not present

## 2022-07-07 DIAGNOSIS — Z91199 Patient's noncompliance with other medical treatment and regimen due to unspecified reason: Secondary | ICD-10-CM | POA: Diagnosis not present

## 2022-07-07 DIAGNOSIS — E7800 Pure hypercholesterolemia, unspecified: Secondary | ICD-10-CM | POA: Diagnosis not present

## 2022-07-07 DIAGNOSIS — Z760 Encounter for issue of repeat prescription: Secondary | ICD-10-CM | POA: Diagnosis not present

## 2022-07-07 DIAGNOSIS — M545 Low back pain, unspecified: Secondary | ICD-10-CM | POA: Diagnosis not present

## 2022-07-07 DIAGNOSIS — Z5181 Encounter for therapeutic drug level monitoring: Secondary | ICD-10-CM | POA: Diagnosis not present

## 2022-07-07 DIAGNOSIS — G8929 Other chronic pain: Secondary | ICD-10-CM | POA: Diagnosis not present

## 2022-07-09 DIAGNOSIS — Z79899 Other long term (current) drug therapy: Secondary | ICD-10-CM | POA: Diagnosis not present

## 2022-07-22 DIAGNOSIS — N184 Chronic kidney disease, stage 4 (severe): Secondary | ICD-10-CM | POA: Diagnosis not present

## 2022-07-25 ENCOUNTER — Ambulatory Visit: Payer: 59 | Admitting: Physician Assistant

## 2022-07-28 ENCOUNTER — Ambulatory Visit: Payer: 59 | Admitting: Physician Assistant

## 2022-08-07 DIAGNOSIS — E7800 Pure hypercholesterolemia, unspecified: Secondary | ICD-10-CM | POA: Diagnosis not present

## 2022-08-07 DIAGNOSIS — I10 Essential (primary) hypertension: Secondary | ICD-10-CM | POA: Diagnosis not present

## 2022-08-07 DIAGNOSIS — G8929 Other chronic pain: Secondary | ICD-10-CM | POA: Diagnosis not present

## 2022-08-07 DIAGNOSIS — N1832 Chronic kidney disease, stage 3b: Secondary | ICD-10-CM | POA: Diagnosis not present

## 2022-08-07 DIAGNOSIS — Z5181 Encounter for therapeutic drug level monitoring: Secondary | ICD-10-CM | POA: Diagnosis not present

## 2022-08-07 DIAGNOSIS — Z91199 Patient's noncompliance with other medical treatment and regimen due to unspecified reason: Secondary | ICD-10-CM | POA: Diagnosis not present

## 2022-08-07 DIAGNOSIS — Z79899 Other long term (current) drug therapy: Secondary | ICD-10-CM | POA: Diagnosis not present

## 2022-08-07 DIAGNOSIS — M545 Low back pain, unspecified: Secondary | ICD-10-CM | POA: Diagnosis not present

## 2022-08-11 DIAGNOSIS — Z79899 Other long term (current) drug therapy: Secondary | ICD-10-CM | POA: Diagnosis not present

## 2022-08-21 ENCOUNTER — Ambulatory Visit (INDEPENDENT_AMBULATORY_CARE_PROVIDER_SITE_OTHER): Payer: 59 | Admitting: Physician Assistant

## 2022-08-21 ENCOUNTER — Encounter: Payer: Self-pay | Admitting: Physician Assistant

## 2022-08-21 VITALS — BP 140/70 | HR 76 | Resp 18 | Ht 72.0 in | Wt 207.0 lb

## 2022-08-21 DIAGNOSIS — F0390 Unspecified dementia without behavioral disturbance: Secondary | ICD-10-CM | POA: Diagnosis not present

## 2022-08-21 NOTE — Progress Notes (Addendum)
Assessment/Plan:   Dementia likely due to Alzheimer's Disease   Edward Morgan is a very pleasant 72 y.o. RH malewith a history of hypertension, hyperlipidemia, CKD3 seen today in follow up for memory loss. Patient is currently on memantine 10 mg twice daily, tolerating well. MMSE today is 21/30. Memory is  from prior visit.  Patient is able to participate on his IADLs.       Follow up in 6  months. Continue memantine 10 mg twice daily.  Side effects discussed Recommend good control of her cardiovascular risk factors Continue to control mood as per PCP    Subjective:    This patient is accompanied in the office by his daughter  who supplements the history.  Previous records as well as any outside records available were reviewed prior to todays visit. Patient was last seen on 01/20/2022.  Last MoCA on August 2023 was 11/30.    Any changes in memory since last visit? " About the same".  He forgets the conversations and names of people but "able to remember her face ". Likes to watch sports on TV. repeats oneself?  Endorsed, not worse than before. Disoriented when walking into a room?  Sometimes he has some difficulty finding the room in the house such as the bedroom or bathroom.   Leaving objects in unusual places?  May misplace things but not in unusual places, but "getting a little better on that"   Wandering behavior?  denies   Any personality changes since last visit?  denies   Any worsening depression?:  denies   Hallucinations or paranoia?  denies   Seizures? denies    Any sleep changes?  Sleeps well.  Denies vivid dreams, REM behavior or sleepwalking   Sleep apnea?   denies   Any hygiene concerns? denies  Independent of bathing and dressing?  Endorsed  Does the patient needs help with medications?  Wife is in charge   Who is in charge of the finances?  Wife is in charge, she is POA     Any changes in appetite?  denies     Patient have trouble swallowing? denies   Does  the patient cook? No Any headaches?   denies   Chronic back pain  denies   Ambulates with difficulty?  He does not like to use the cane.  Recent falls or head injuries? denies     Unilateral weakness, numbness or tingling? denies   Any tremors?  denies   Any anosmia?  Endorsed, for the last 4 years. Any incontinence of urine?  Endorsed .  Sometimes he has to get up 3 or 4 times at night to urinate.    Any bowel dysfunction?     denies      Patient lives with wife   Does the patient drive? No longer drives     Initial visit 10/18/2021 How long did patient have memory difficulties?  Patient reports that his memory issues have been present for the last 2-3 months -however he is wife reports it has been about 2-3 years "if not more ".  He forgets recent conversations, and sometimes he does not answer what he is being asked.  Both short-term and long-term memory are affected. Patient lives with: Spouse  repeats oneself? endorsed, such as "where we going "several times.  "Not so much about telling the same stories " Disoriented when walking into a room? Endorsed.he has shown difficulty finding rooms inside the house such as the bedroom or  bathroom.  Sometimes he has "accidents ", thinking that is the bathroom and he urinates in the hallway.   Leaving objects in unusual places? Endorsed.  "He moves stuff everywhere "and wife cannot find it  Ambulates  with difficulty?  Occasionally he has balance problems.  His daughter states that he begins walking, then the steps get smaller, if the distance is long, and then he is at risk of falling.  Occasionally he uses a cane but not all the time. Recent falls? 2 nights ago he fell from the bed Any head injuries? 6-7 y he had a hemorrhagic stroke.  No apparent residuals according to the family. History of seizures?   Patient denies   Wandering behavior?  Patient denies his wife reports that "he just stands there ". Recently he took the wrong bus. Patient  drives?   Patient no longer drives    Any mood changes such irritability agitation?  Endorsed. "He is more hateful that it used to be, especially over the last 2 months"-his wife says Any history of depression?:  denies   Hallucinations?  Patient denies   Paranoia?  Patient denies   Patient reports that sleeps "not well, roams around the house "denies vivid dreams, REM behavior or sleepwalking    History of sleep apnea?  Patient denies   Any hygiene concerns?  Endorsed especially over the last 2 months. He does not want to take a shower  Independent of bathing and dressing?  Endorsed.  His wife lines up the clothing Does the patient needs help with medications?  Wife in charge  Who is in charge of the finances?  Patient is in charge but she needs a POA because it's getting worse.  They are in the process of doing so. Any changes in appetite?  He doesn't  eat like he used to, he may be forgetting to eat  Patient have trouble swallowing? Patient denies   Does the patient cook?  Patient denies   Any kitchen accidents such as leaving the stove on? He burns my food all the time, I wanna keep him out of the kitchen Any headaches?  Endorsed, occasionally in the forehead "usually do to blood pressure" Double vision? Patient denies   Any focal numbness or tingling?  Patient denies   Chronic back pain Patient denies   Unilateral weakness?  Patient denies   Any tremors?  Patient denies   Any history of anosmia?  For the last 2-3 years  Any incontinence of urine?  At night he urinates 3-4  night, sometimes in the hall" Any bowel dysfunction?   Patient denies   History of heavy alcohol intake?   "He's been a drank all his life, liquor, but he backed down " History of heavy tobacco use?  Patient denies   Family history of dementia?   Mother  Alzheimer's disease           10/24/2021 MRI brain personally reviewed was remarkable for mild chronic small vessel ischemic disease progressive symptoms prior  MRI in 2012, repeat demonstrated chronic lacunar infarct within the right aspect of the pons, and mild generalized parenchymal atrophy.  MRI brain 01/28/11 Hemorrhagic infarction affecting the left basal ganglia.  The hematoma measures 23 x 13 mm by MR.  Small hemorrhagic infarction affecting the posterior body of the caudate on the left. Moderate chronic small vessel changes elsewhere throughout the  brain.    MRA head 01/28/11 No major vessel occlusion. 50% stenosis of the right middle cerebral  artery in the 1 day to junction region.  PREVIOUS MEDICATIONS:   CURRENT MEDICATIONS:  Outpatient Encounter Medications as of 08/21/2022  Medication Sig   amLODipine (NORVASC) 10 MG tablet TAKE ONE TABLET BY MOUTH ONCE DAILY   ammonium lactate (AMLACTIN) 12 % lotion Apply 1 Application topically as needed for dry skin.   aspirin EC 81 MG tablet Take by mouth.   atenolol (TENORMIN) 100 MG tablet Take 100 mg by mouth daily.   Buprenorphine HCl-Naloxone HCl 8-2 MG FILM Place under the tongue 3 (three) times daily.   cetirizine (ZYRTEC) 10 MG tablet Take by mouth.   cloNIDine (CATAPRES) 0.1 MG tablet TAKE 1 TABLET TWICE A DAY (Patient taking differently: Take 0.1 mg by mouth 2 (two) times daily.)   cloNIDine (CATAPRES) 0.2 MG tablet TAKE ONE TABLET BY MOUTH TWICE DAILY   doxazosin (CARDURA) 1 MG tablet Take 1 mg by mouth at bedtime.   esomeprazole (NEXIUM) 40 MG capsule TAKE ONE CAPSULE BY MOUTH ONCE DAILY   furosemide (LASIX) 20 MG tablet TAKE 1 (ONE) TABLET DAILY FOR FLUID   lisinopril (ZESTRIL) 20 MG tablet TAKE ONE TABLET BY MOUTH ONCE DAILY FOR BLOOD PRESSURE   memantine (NAMENDA) 10 MG tablet Take 1 tablet twice a day   Multiple Vitamin (MULTIVITAMIN WITH MINERALS) TABS tablet Take 1 tablet by mouth daily.   pravastatin (PRAVACHOL) 20 MG tablet TAKE ONE TABLET BY MOUTH ONCE DAILY FOR CHOLESTEROL   tamsulosin (FLOMAX) 0.4 MG CAPS capsule TAKE ONE CAPSULE BY MOUTH ONCE DAILY   No  facility-administered encounter medications on file as of 08/21/2022.       08/21/2022   12:00 PM  MMSE - Mini Mental State Exam  Orientation to time 4  Orientation to Place 4  Registration 3  Attention/ Calculation 0  Recall 2  Language- name 2 objects 2  Language- repeat 1  Language- follow 3 step command 3  Language- read & follow direction 1  Write a sentence 1  Copy design 0  Total score 21      10/18/2021    8:00 AM  Montreal Cognitive Assessment   Visuospatial/ Executive (0/5) 0  Naming (0/3) 3  Attention: Read list of digits (0/2) 0  Attention: Read list of letters (0/1) 1  Attention: Serial 7 subtraction starting at 100 (0/3) 0  Language: Repeat phrase (0/2) 1  Language : Fluency (0/1) 0  Abstraction (0/2) 0  Delayed Recall (0/5) 0  Orientation (0/6) 5  Total 10  Adjusted Score (based on education) 11    Objective:     PHYSICAL EXAMINATION:    VITALS:   Vitals:   08/21/22 0911 08/21/22 1001  BP: (!) 155/77 (!) 140/70  Pulse: 76   Resp: 18   SpO2: 98%   Weight: 207 lb (93.9 kg)   Height: 6' (1.829 m)     GEN:  The patient appears stated age and is in NAD. HEENT:  Normocephalic, atraumatic.   Neurological examination:  General: NAD, well-groomed, appears stated age. Orientation: The patient is alert. Oriented to person, place and not to date Cranial nerves: There is good facial symmetry.The speech is fluent and clear. No aphasia or dysarthria. Fund of knowledge is appropriate. Recent and remote memory are impaired. Attention and concentration are reduced.  Able to name objects and repeat phrases.  Hearing is intact to conversational tone.   Sensation: Sensation is intact to light touch throughout Motor: Strength is at least antigravity x4. DTR's 2/4 in UE/LE  Movement examination: Tone: There is normal tone in the UE/LE Abnormal movements:  no tremor.  No myoclonus.  No asterixis.   Coordination:  There is no decremation with RAM's. Normal  finger to nose  Gait and Station: The patient has no difficulty arising out of a deep-seated chair without the use of the hands. The patient's stride length is good.  Gait is cautious and narrow.    Thank you for allowing [place] the opportunity to participate in the care of this nice patient. Please do not hesitate to contact us for any questions or concerns.   Total time spent on today's visit was 22 minutes dedicated to this patient today, preparing to see patient, examining the patient, ordering tests and/or medications and counseling the patient, documenting clinical information in the EHR or other health record, independently interpreting results and communicating results to the patient/family, discussing treatment and goals, answering patient's questions and coordinating care.  Cc:  Nestor Ramp, MD  Marlowe Kays 08/21/2022 12:20 PM

## 2022-08-21 NOTE — Patient Instructions (Signed)
It was a pleasure to see you today at our office.   Recommendations:  Follow up in 6  months  Continue memantine 1 tablet (10 mg) twice a day.      Whom to call:  Memory  decline, memory medications: Call our office [phone]   For psychiatric meds, mood meds: Please have your primary care physician manage these medications.     For assessment of decision of mental capacity and competency:  Call Dr. Erick Blinks, geriatric psychiatrist at [phone]    For guidance regarding [facility] and if placement were needed at the facility, contact Sidney Ace, Social Worker tel: [phone]  If you have any severe symptoms of a stroke, or other severe issues such as confusion,severe chills or fever, etc call 911 or go to the ER as you may need to be evaluated further      RECOMMENDATIONS FOR ALL PATIENTS WITH MEMORY PROBLEMS: 1. Continue to exercise (Recommend 30 minutes of walking everyday, or 3 hours every week) 2. Increase social interactions - continue going to [place] and enjoy social gatherings with friends and family 3. Eat healthy, avoid fried foods and eat more fruits and vegetables 4. Maintain adequate blood pressure, blood sugar, and blood cholesterol level. Reducing the risk of stroke and cardiovascular disease also helps promoting better memory. 5. Avoid stressful situations. Live a simple life and avoid aggravations. Organize your time and prepare for the next day in anticipation. 6. Sleep well, avoid any interruptions of sleep and avoid any distractions in the bedroom that may interfere with adequate sleep quality 7. Avoid sugar, avoid sweets as there is a strong link between excessive sugar intake, diabetes, and cognitive impairment We discussed the Mediterranean diet, which has been shown to help patients reduce the risk of progressive memory disorders and reduces cardiovascular risk. This includes eating fish, eat fruits and green leafy  vegetables, nuts like almonds and hazelnuts, walnuts, and also use olive oil. Avoid fast foods and fried foods as much as possible. Avoid sweets and sugar as sugar use has been linked to worsening of memory function.  There is always a concern of gradual progression of memory problems. If this is the case, then we may need to adjust level of care according to patient needs. Support, both to the patient and caregiver, should then be put into place.    The Alzheimer's Association is here all day, every day for people facing Alzheimer's disease through our free 24/7 Helpline: [phone]. The Helpline provides reliable information and support to all those who need assistance, such as individuals living with memory loss, Alzheimer's or other dementia, caregivers, health care professionals and the public.  Our highly trained and knowledgeable staff can help you with: Understanding memory loss, dementia and Alzheimer's  Medications and other treatment options  General information about aging and brain health  Skills to provide quality care and to find the best care from professionals  Legal, financial and living-arrangement decisions Our Helpline also features: Confidential care consultation provided by master's level clinicians who can help with decision-making support, crisis assistance and education on issues families face every day  Help in a caller's preferred language using our translation service that features more than 200 languages and dialects  Referrals to local community programs, services and ongoing support     FALL PRECAUTIONS: Be cautious when walking. Scan the area for obstacles that may increase the risk of trips and falls. When getting up in the mornings, sit up at  the edge of the bed for a few minutes before getting out of bed. Consider elevating the bed at the head end to avoid drop of blood pressure when getting up. Walk always in a well-lit room (use night lights in the walls).  Avoid area rugs or power cords from appliances in the middle of the walkways. Use a walker or a cane if necessary and consider physical therapy for balance exercise. Get your eyesight checked regularly.  FINANCIAL OVERSIGHT: Supervision, especially oversight when making financial decisions or transactions is also recommended.  HOME SAFETY: Consider the safety of the kitchen when operating appliances like stoves, microwave oven, and blender. Consider having supervision and share cooking responsibilities until no longer able to participate in those. Accidents with firearms and other hazards in the house should be identified and addressed as well.   ABILITY TO BE LEFT ALONE: If patient is unable to contact 911 operator, consider using LifeLine, or when the need is there, arrange for someone to stay with patients. Smoking is a fire hazard, consider supervision or cessation. Risk of wandering should be assessed by caregiver and if detected at any point, supervision and safe proof recommendations should be instituted.  MEDICATION SUPERVISION: Inability to self-administer medication needs to be constantly addressed. Implement a mechanism to ensure safe administration of the medications.   DRIVING: Regarding driving, in patients with progressive memory problems, driving will be impaired. We advise to have someone else do the driving if trouble finding directions or if minor accidents are reported. Independent driving assessment is available to determine safety of driving.   If you are interested in the driving assessment, you can contact the following:  [employer] in [place] [phone]  [facility] [phone]  [facility] [phone] or [phone]      Mediterranean Diet A Mediterranean diet refers to food and lifestyle choices that are based on the traditions of countries located on the [employer]. This  way of eating has been shown to help prevent certain conditions and improve outcomes for people who have chronic diseases, like kidney disease and heart disease. What are tips for following this plan? Lifestyle  Cook and eat meals together with your family, when possible. Drink enough fluid to keep your urine clear or pale yellow. Be physically active every day. This includes: Aerobic exercise like running or swimming. Leisure activities like gardening, walking, or housework. Get 7-8 hours of sleep each night. If recommended by your health care provider, drink red wine in moderation. This means 1 glass a day for nonpregnant women and 2 glasses a day for men. A glass of wine equals 5 oz (150 mL). Reading food labels  Check the serving size of packaged foods. For foods such as rice and pasta, the serving size refers to the amount of cooked product, not dry. Check the total fat in packaged foods. Avoid foods that have saturated fat or trans fats. Check the ingredients list for added sugars, such as corn syrup. Shopping  At the grocery store, buy most of your food from the areas near the walls of the store. This includes: Fresh fruits and vegetables (produce). Grains, beans, nuts, and seeds. Some of these may be available in unpackaged forms or large amounts (in bulk). Fresh seafood. Poultry and eggs. Low-fat dairy products. Buy whole ingredients instead of prepackaged foods. Buy fresh fruits and vegetables in-season from local farmers markets. Buy frozen fruits and vegetables in resealable bags. If you do not  have access to quality fresh seafood, buy precooked frozen shrimp or canned fish, such as tuna, salmon, or sardines. Buy small amounts of raw or cooked vegetables, salads, or olives from the deli or salad bar at your store. Stock your pantry so you always have certain foods on hand, such as olive oil, canned tuna, canned tomatoes, rice, pasta, and beans. Cooking  Cook foods with  extra-virgin olive oil instead of using butter or other vegetable oils. Have meat as a side dish, and have vegetables or grains as your main dish. This means having meat in small portions or adding small amounts of meat to foods like pasta or stew. Use beans or vegetables instead of meat in common dishes like chili or lasagna. Experiment with different cooking methods. Try roasting or broiling vegetables instead of steaming or sauteing them. Add frozen vegetables to soups, stews, pasta, or rice. Add nuts or seeds for added healthy fat at each meal. You can add these to yogurt, salads, or vegetable dishes. Marinate fish or vegetables using olive oil, lemon juice, garlic, and fresh herbs. Meal planning  Plan to eat 1 vegetarian meal one day each week. Try to work up to 2 vegetarian meals, if possible. Eat seafood 2 or more times a week. Have healthy snacks readily available, such as: Vegetable sticks with hummus. Greek yogurt. Fruit and nut trail mix. Eat balanced meals throughout the week. This includes: Fruit: 2-3 servings a day Vegetables: 4-5 servings a day Low-fat dairy: 2 servings a day Fish, poultry, or lean meat: 1 serving a day Beans and legumes: 2 or more servings a week Nuts and seeds: 1-2 servings a day Whole grains: 6-8 servings a day Extra-virgin olive oil: 3-4 servings a day Limit red meat and sweets to only a few servings a month What are my food choices? Mediterranean diet Recommended Grains: Whole-grain pasta. Brown rice. Bulgar wheat. Polenta. Couscous. Whole-wheat bread. Orpah Cobb. Vegetables: Artichokes. Beets. Broccoli. Cabbage. Carrots. Eggplant. Green beans. Chard. Kale. Spinach. Onions. Leeks. Peas. Squash. Tomatoes. Peppers. Radishes. Fruits: Apples. Apricots. Avocado. Berries. Bananas. Cherries. Dates. Figs. Grapes. Lemons. Melon. Oranges. Peaches. Plums. Pomegranate. Meats and other protein foods: Beans. Almonds. Sunflower seeds. Pine nuts. Peanuts. Cod.  Salmon. Scallops. Shrimp. Tuna. Tilapia. Clams. Oysters. Eggs. Dairy: Low-fat milk. Cheese. Greek yogurt. Beverages: Water. Red wine. Herbal tea. Fats and oils: Extra virgin olive oil. Avocado oil. Grape seed oil. Sweets and desserts: [place] yogurt with honey. Baked apples. Poached pears. Trail mix. Seasoning and other foods: Basil. Cilantro. Coriander. Cumin. Mint. Parsley. Sage. Rosemary. Tarragon. Garlic. Oregano. Thyme. Pepper. Balsalmic vinegar. Tahini. Hummus. Tomato sauce. Olives. Mushrooms. Limit these Grains: Prepackaged pasta or rice dishes. Prepackaged cereal with added sugar. Vegetables: Deep fried potatoes (french fries). Fruits: Fruit canned in syrup. Meats and other protein foods: Beef. Pork. Lamb. Poultry with skin. Hot dogs. Tomasa Blase. Dairy: Ice cream. Sour cream. Whole milk. Beverages: Juice. Sugar-sweetened soft drinks. Beer. Liquor and spirits. Fats and oils: Butter. Canola oil. Vegetable oil. Beef fat (tallow). Lard. Sweets and desserts: Cookies. Cakes. Pies. Candy. Seasoning and other foods: Mayonnaise. Premade sauces and marinades. The items listed may not be a complete list. Talk with your dietitian about what dietary choices are right for you. Summary The Mediterranean diet includes both food and lifestyle choices. Eat a variety of fresh fruits and vegetables, beans, nuts, seeds, and whole grains. Limit the amount of red meat and sweets that you eat. Talk with your health care provider about whether it is safe for you  to drink red wine in moderation. This means 1 glass a day for nonpregnant women and 2 glasses a day for men. A glass of wine equals 5 oz (150 mL). This information is not intended to replace advice given to you by your health care provider. Make sure you discuss any questions you have with your health care provider. Document Released: 10/04/2015 Document Revised: 11/06/2015 Document Reviewed: 10/04/2015 Elsevier Interactive Patient Education  2017 [employer].   We have sent a referral to [facility] for your MRI and they will call you directly to schedule your appointment. They are located at [address] [facility]. If you need to contact them directly please call [phone].  Your provider has requested that you have labwork completed today. Please go to [facility] ([id number]) on the second floor of this building before leaving the office today. You do not need to check in. If you are not called within 15 minutes please check with the front desk.

## 2022-09-01 ENCOUNTER — Other Ambulatory Visit: Payer: Self-pay | Admitting: Family Medicine

## 2022-09-04 DIAGNOSIS — M545 Low back pain, unspecified: Secondary | ICD-10-CM | POA: Diagnosis not present

## 2022-09-04 DIAGNOSIS — G8929 Other chronic pain: Secondary | ICD-10-CM | POA: Diagnosis not present

## 2022-09-04 DIAGNOSIS — I10 Essential (primary) hypertension: Secondary | ICD-10-CM | POA: Diagnosis not present

## 2022-09-10 ENCOUNTER — Ambulatory Visit: Payer: 59 | Admitting: Podiatry

## 2022-09-19 DIAGNOSIS — N184 Chronic kidney disease, stage 4 (severe): Secondary | ICD-10-CM | POA: Diagnosis not present

## 2022-09-19 DIAGNOSIS — I129 Hypertensive chronic kidney disease with stage 1 through stage 4 chronic kidney disease, or unspecified chronic kidney disease: Secondary | ICD-10-CM | POA: Diagnosis not present

## 2022-09-19 DIAGNOSIS — G309 Alzheimer's disease, unspecified: Secondary | ICD-10-CM | POA: Diagnosis not present

## 2022-09-19 DIAGNOSIS — N2581 Secondary hyperparathyroidism of renal origin: Secondary | ICD-10-CM | POA: Diagnosis not present

## 2022-09-19 DIAGNOSIS — R809 Proteinuria, unspecified: Secondary | ICD-10-CM | POA: Diagnosis not present

## 2022-09-29 ENCOUNTER — Ambulatory Visit (INDEPENDENT_AMBULATORY_CARE_PROVIDER_SITE_OTHER): Payer: 59 | Admitting: Podiatry

## 2022-09-29 ENCOUNTER — Encounter: Payer: Self-pay | Admitting: Podiatry

## 2022-09-29 DIAGNOSIS — B351 Tinea unguium: Secondary | ICD-10-CM | POA: Diagnosis not present

## 2022-09-29 DIAGNOSIS — M21371 Foot drop, right foot: Secondary | ICD-10-CM

## 2022-09-29 DIAGNOSIS — M79675 Pain in left toe(s): Secondary | ICD-10-CM | POA: Diagnosis not present

## 2022-09-29 DIAGNOSIS — N184 Chronic kidney disease, stage 4 (severe): Secondary | ICD-10-CM

## 2022-09-29 DIAGNOSIS — M79674 Pain in right toe(s): Secondary | ICD-10-CM | POA: Diagnosis not present

## 2022-09-29 NOTE — Progress Notes (Signed)
This patient returns to my office for at risk foot care.  This patient requires this care by a professional since this patient will be at risk due to having dementia, right foot drop and CKD.  This patient is unable to cut nails himself since the patient cannot reach his nails.These nails are painful walking and wearing shoes.  This patient presents for at risk foot care today.  General Appearance  Alert, conversant and in no acute stress.  Vascular  Dorsalis pedis and posterior tibial  pulses are palpable  bilaterally.  Capillary return is within normal limits  bilaterally. Temperature is within normal limits  bilaterally.  Neurologic  Senn-Weinstein monofilament wire test within normal limits  bilaterally. Muscle power within normal limits bilaterally.  Nails Thick disfigured discolored nails with subungual debris  from hallux to fifth toes bilaterally. No evidence of bacterial infection or drainage bilaterally.  Orthopedic  No limitations of motion  feet .  No crepitus or effusions noted.  HAV  B/L.  Skin  normotropic skin with no porokeratosis noted bilaterally.  No signs of infections or ulcers noted.     Onychomycosis  Pain in right toes  Pain in left toes  Consent was obtained for treatment procedures.   Mechanical debridement of nails 1-5  bilaterally performed with a nail nipper.  Filed with dremel without incident.    Return office visit     3 months                 Told patient to return for periodic foot care and evaluation due to potential at risk complications.   Helane Gunther DPM

## 2022-10-03 DIAGNOSIS — I10 Essential (primary) hypertension: Secondary | ICD-10-CM | POA: Diagnosis not present

## 2022-10-03 DIAGNOSIS — E7800 Pure hypercholesterolemia, unspecified: Secondary | ICD-10-CM | POA: Diagnosis not present

## 2022-10-03 DIAGNOSIS — M545 Low back pain, unspecified: Secondary | ICD-10-CM | POA: Diagnosis not present

## 2022-10-03 DIAGNOSIS — Z5181 Encounter for therapeutic drug level monitoring: Secondary | ICD-10-CM | POA: Diagnosis not present

## 2022-10-03 DIAGNOSIS — G8929 Other chronic pain: Secondary | ICD-10-CM | POA: Diagnosis not present

## 2022-10-03 DIAGNOSIS — Z79899 Other long term (current) drug therapy: Secondary | ICD-10-CM | POA: Diagnosis not present

## 2022-10-09 DIAGNOSIS — Z79899 Other long term (current) drug therapy: Secondary | ICD-10-CM | POA: Diagnosis not present

## 2022-10-12 ENCOUNTER — Other Ambulatory Visit: Payer: Self-pay | Admitting: Family Medicine

## 2022-10-12 MED ORDER — AMLODIPINE BESYLATE 10 MG PO TABS: 5.0000 mg | ORAL_TABLET | Freq: Every day | ORAL | Status: DC

## 2022-10-12 MED ORDER — FUROSEMIDE 20 MG PO TABS: 80.0000 mg | ORAL_TABLET | Freq: Every day | ORAL | Status: DC

## 2022-10-12 NOTE — Progress Notes (Signed)
Updated med list with changes from [facility] Amlodipine 5 Lasix 80

## 2022-11-05 DIAGNOSIS — Z79899 Other long term (current) drug therapy: Secondary | ICD-10-CM | POA: Diagnosis not present

## 2022-11-05 DIAGNOSIS — R5383 Other fatigue: Secondary | ICD-10-CM | POA: Diagnosis not present

## 2022-11-05 DIAGNOSIS — G8929 Other chronic pain: Secondary | ICD-10-CM | POA: Diagnosis not present

## 2022-11-05 DIAGNOSIS — D539 Nutritional anemia, unspecified: Secondary | ICD-10-CM | POA: Diagnosis not present

## 2022-11-05 DIAGNOSIS — M545 Low back pain, unspecified: Secondary | ICD-10-CM | POA: Diagnosis not present

## 2022-11-05 DIAGNOSIS — R7302 Impaired glucose tolerance (oral): Secondary | ICD-10-CM | POA: Diagnosis not present

## 2022-11-05 DIAGNOSIS — E7800 Pure hypercholesterolemia, unspecified: Secondary | ICD-10-CM | POA: Diagnosis not present

## 2022-11-05 DIAGNOSIS — I10 Essential (primary) hypertension: Secondary | ICD-10-CM | POA: Diagnosis not present

## 2022-11-05 DIAGNOSIS — E559 Vitamin D deficiency, unspecified: Secondary | ICD-10-CM | POA: Diagnosis not present

## 2022-11-05 DIAGNOSIS — M129 Arthropathy, unspecified: Secondary | ICD-10-CM | POA: Diagnosis not present

## 2022-11-07 DIAGNOSIS — Z79899 Other long term (current) drug therapy: Secondary | ICD-10-CM | POA: Diagnosis not present

## 2022-11-28 ENCOUNTER — Other Ambulatory Visit: Payer: Self-pay | Admitting: Family Medicine

## 2022-11-28 DIAGNOSIS — Z1211 Encounter for screening for malignant neoplasm of colon: Secondary | ICD-10-CM

## 2022-11-28 DIAGNOSIS — Z1212 Encounter for screening for malignant neoplasm of rectum: Secondary | ICD-10-CM

## 2022-12-04 DIAGNOSIS — E7800 Pure hypercholesterolemia, unspecified: Secondary | ICD-10-CM | POA: Diagnosis not present

## 2022-12-04 DIAGNOSIS — I10 Essential (primary) hypertension: Secondary | ICD-10-CM | POA: Diagnosis not present

## 2022-12-04 DIAGNOSIS — G8929 Other chronic pain: Secondary | ICD-10-CM | POA: Diagnosis not present

## 2022-12-04 DIAGNOSIS — N184 Chronic kidney disease, stage 4 (severe): Secondary | ICD-10-CM | POA: Diagnosis not present

## 2022-12-04 DIAGNOSIS — M545 Low back pain, unspecified: Secondary | ICD-10-CM | POA: Diagnosis not present

## 2022-12-04 DIAGNOSIS — R7303 Prediabetes: Secondary | ICD-10-CM | POA: Diagnosis not present

## 2022-12-04 DIAGNOSIS — Z79899 Other long term (current) drug therapy: Secondary | ICD-10-CM | POA: Diagnosis not present

## 2022-12-08 ENCOUNTER — Other Ambulatory Visit: Payer: Self-pay

## 2022-12-09 ENCOUNTER — Other Ambulatory Visit: Payer: Self-pay | Admitting: Family Medicine

## 2022-12-09 DIAGNOSIS — Z79899 Other long term (current) drug therapy: Secondary | ICD-10-CM | POA: Diagnosis not present

## 2022-12-09 MED ORDER — AMLODIPINE BESYLATE 10 MG PO TABS: 5.0000 mg | ORAL_TABLET | Freq: Every day | ORAL | Status: DC

## 2022-12-09 NOTE — Progress Notes (Unsigned)
Received

## 2022-12-10 MED ORDER — CLONIDINE HCL 0.2 MG PO TABS: 0.2000 mg | ORAL_TABLET | Freq: Two times a day (BID) | ORAL | 3 refills | Status: DC

## 2022-12-19 ENCOUNTER — Ambulatory Visit (INDEPENDENT_AMBULATORY_CARE_PROVIDER_SITE_OTHER): Payer: 59 | Admitting: Family Medicine

## 2022-12-19 VITALS — BP 161/75 | Ht 72.0 in | Wt 210.0 lb

## 2022-12-19 DIAGNOSIS — M25461 Effusion, right knee: Secondary | ICD-10-CM | POA: Diagnosis not present

## 2022-12-19 DIAGNOSIS — G894 Chronic pain syndrome: Secondary | ICD-10-CM

## 2022-12-19 DIAGNOSIS — M25562 Pain in left knee: Secondary | ICD-10-CM | POA: Diagnosis not present

## 2022-12-19 DIAGNOSIS — Z9889 Other specified postprocedural states: Secondary | ICD-10-CM | POA: Diagnosis not present

## 2022-12-19 MED ORDER — METHYLPREDNISOLONE ACETATE 40 MG/ML IJ SUSP
40.0000 mg | Freq: Once | INTRAMUSCULAR | Status: AC
Start: 2022-12-19 — End: 2022-12-19
  Administered 2022-12-19: 40 mg via INTRA_ARTICULAR

## 2022-12-19 MED ORDER — FLUTICASONE PROPIONATE 50 MCG/ACT NA SUSP: 1.0000 | Freq: Every day | NASAL | 2 refills | Status: DC

## 2022-12-21 ENCOUNTER — Encounter: Payer: Self-pay | Admitting: Family Medicine

## 2022-12-21 NOTE — Progress Notes (Signed)
Edward Morgan - 72 y.o. male MRN [id number]  Date of birth: 1950/06/03    SUBJECTIVE:      Chief Complaint:/ HPI:  Left knee pain Had some type of surgery many years ago and occasionally has some stiffness and mild pain but about a week or so ago he twisted while he was taking out the trash and since then he has had significant increase in pain and swelling.  Pain is now 6-7 out of 10 almost all the time.  He is walking with a limp.  He is here with his daughter.  PERTINENT  PMH / PSH: I have reviewed the patient's medications, allergies, past medical and surgical history, smoking status.  Pertinent findings that relate to today's visit / issues include: Some type of prior surgery on the left knee more than 30 years ago.  He says he thought it was a knee "replacement".  The scar is certainly not consistent with that as it is too small and laterally placed.  I have no imaging available from that time or any knee imaginmg at all.his past medical history just refers to knee surgery. Dementia Currently managed on buprenorphine/naloxone via the pain clinic Resistant hypertension on 4 agents History hepatitis C, undetectable at last check.  OBJECTIVE: BP (!) 161/75   Ht 6' (1.829 m)   Wt 210 lb (95.3 kg)   BMI 28.48 kg/m   Physical Exam:  Vital signs are reviewed. GENERAL: Well-developed male no acute distress KNEE: Left.  Well-healed old surgical scar on the lateral portion of the knee.  He has some small effusion and swelling of the soft tissues medially.  He cannot fully extend the knee lacking about 20 degrees.  He also lacks about 10 degrees of full extension on the right side for comparison.  Popliteal space is benign.  Calf is nontender.  He has good quadriceps muscle development which is bilaterally symmetrical. SKIN: There is no unusual warmth or erythema of the skin around the left knee.   PROCEDURE: INJECTION: Patient was given informed consent, signed copy in the chart.  Appropriate time out was taken. Area prepped and draped in usual sterile fashion. Ethyl chloride was  used for local anesthesia. A 21 gauge 1 1/2 inch needle was used..  1 cc of methylprednisolone 40 mg/ml plus 4 cc of 1% lidocaine without epinephrine was injected into the left knee using a(n) anterior medial approach.   The patient tolerated the procedure well. There were no complications. Post procedure instructions were given.  ASSESSMENT & PLAN:   Left knee pain and effusion: History of some type prior surgical intervention many years ago. I discussed with him and his daughter that this is most likely a meniscal injury.    Hx of knee surgery: I  do not think that he had any prosthetic joint placement previously, but need to confirm type of surgery. I would like to get some imaging to determine current joint state and space as well so will order Xrays.   PLAN: Left knee corticosteroid injection performed today. He will f/u in 1-2 weeks, can report via phone or my chart. Also need to make f/u appt to see me in [facility].  Medical Decision Making additional elements: Decision to pursue corticosteroid injection for this patient is additionally complex due to health care factors listed in PMHx.  : He has some dementia so I explained in detail the procedure of CSI and other options. He is limited in what I can offer fro  pain given his current treatment for opioid use disorder.

## 2022-12-22 ENCOUNTER — Ambulatory Visit
Admission: RE | Admit: 2022-12-22 | Discharge: 2022-12-22 | Disposition: A | Discharge status: Home / Self Care | Payer: 59 | Source: Ambulatory Visit | Attending: Family Medicine | Admitting: Family Medicine

## 2022-12-22 DIAGNOSIS — G309 Alzheimer's disease, unspecified: Secondary | ICD-10-CM | POA: Diagnosis not present

## 2022-12-22 DIAGNOSIS — N2581 Secondary hyperparathyroidism of renal origin: Secondary | ICD-10-CM | POA: Diagnosis not present

## 2022-12-22 DIAGNOSIS — M25562 Pain in left knee: Secondary | ICD-10-CM | POA: Diagnosis not present

## 2022-12-22 DIAGNOSIS — N184 Chronic kidney disease, stage 4 (severe): Secondary | ICD-10-CM | POA: Diagnosis not present

## 2022-12-22 DIAGNOSIS — I129 Hypertensive chronic kidney disease with stage 1 through stage 4 chronic kidney disease, or unspecified chronic kidney disease: Secondary | ICD-10-CM | POA: Diagnosis not present

## 2022-12-22 DIAGNOSIS — R809 Proteinuria, unspecified: Secondary | ICD-10-CM | POA: Diagnosis not present

## 2022-12-23 ENCOUNTER — Other Ambulatory Visit: Payer: Self-pay | Admitting: Nephrology

## 2022-12-23 DIAGNOSIS — R808 Other proteinuria: Secondary | ICD-10-CM

## 2022-12-23 DIAGNOSIS — N184 Chronic kidney disease, stage 4 (severe): Secondary | ICD-10-CM

## 2022-12-23 DIAGNOSIS — N2581 Secondary hyperparathyroidism of renal origin: Secondary | ICD-10-CM

## 2022-12-25 ENCOUNTER — Ambulatory Visit
Admission: RE | Admit: 2022-12-25 | Discharge: 2022-12-25 | Disposition: A | Discharge status: Home / Self Care | Payer: 59 | Source: Ambulatory Visit | Attending: Nephrology | Admitting: Nephrology

## 2022-12-25 DIAGNOSIS — N184 Chronic kidney disease, stage 4 (severe): Secondary | ICD-10-CM

## 2022-12-25 DIAGNOSIS — N2581 Secondary hyperparathyroidism of renal origin: Secondary | ICD-10-CM

## 2022-12-25 DIAGNOSIS — R808 Other proteinuria: Secondary | ICD-10-CM

## 2022-12-31 ENCOUNTER — Ambulatory Visit: Payer: 59 | Admitting: Podiatry

## 2023-01-01 DIAGNOSIS — M545 Low back pain, unspecified: Secondary | ICD-10-CM | POA: Diagnosis not present

## 2023-01-01 DIAGNOSIS — E7800 Pure hypercholesterolemia, unspecified: Secondary | ICD-10-CM | POA: Diagnosis not present

## 2023-01-01 DIAGNOSIS — R7303 Prediabetes: Secondary | ICD-10-CM | POA: Diagnosis not present

## 2023-01-01 DIAGNOSIS — G8929 Other chronic pain: Secondary | ICD-10-CM | POA: Diagnosis not present

## 2023-01-01 DIAGNOSIS — I10 Essential (primary) hypertension: Secondary | ICD-10-CM | POA: Diagnosis not present

## 2023-01-01 DIAGNOSIS — Z79899 Other long term (current) drug therapy: Secondary | ICD-10-CM | POA: Diagnosis not present

## 2023-01-05 ENCOUNTER — Other Ambulatory Visit: Payer: Self-pay | Admitting: Family Medicine

## 2023-01-05 DIAGNOSIS — Z79899 Other long term (current) drug therapy: Secondary | ICD-10-CM | POA: Diagnosis not present

## 2023-01-05 NOTE — Progress Notes (Unsigned)
Reviewed notes from kidney doctor. They stopped doxazosin. Removed from list.

## 2023-01-12 ENCOUNTER — Encounter: Payer: Self-pay | Admitting: Family Medicine

## 2023-01-16 ENCOUNTER — Encounter: Payer: Self-pay | Admitting: Podiatry

## 2023-01-29 DIAGNOSIS — Z5181 Encounter for therapeutic drug level monitoring: Secondary | ICD-10-CM | POA: Diagnosis not present

## 2023-01-29 DIAGNOSIS — R7303 Prediabetes: Secondary | ICD-10-CM | POA: Diagnosis not present

## 2023-01-29 DIAGNOSIS — I10 Essential (primary) hypertension: Secondary | ICD-10-CM | POA: Diagnosis not present

## 2023-01-29 DIAGNOSIS — Z79899 Other long term (current) drug therapy: Secondary | ICD-10-CM | POA: Diagnosis not present

## 2023-01-29 DIAGNOSIS — N184 Chronic kidney disease, stage 4 (severe): Secondary | ICD-10-CM | POA: Diagnosis not present

## 2023-01-29 DIAGNOSIS — E7800 Pure hypercholesterolemia, unspecified: Secondary | ICD-10-CM | POA: Diagnosis not present

## 2023-02-02 DIAGNOSIS — Z79899 Other long term (current) drug therapy: Secondary | ICD-10-CM | POA: Diagnosis not present

## 2023-02-03 ENCOUNTER — Other Ambulatory Visit: Payer: Self-pay | Admitting: Family Medicine

## 2023-02-04 ENCOUNTER — Ambulatory Visit: Payer: 59 | Admitting: Podiatry

## 2023-02-06 ENCOUNTER — Ambulatory Visit: Payer: 59 | Admitting: Podiatry

## 2023-02-09 ENCOUNTER — Ambulatory Visit (INDEPENDENT_AMBULATORY_CARE_PROVIDER_SITE_OTHER): Payer: 59 | Admitting: Physician Assistant

## 2023-02-09 VITALS — BP 145/68 | HR 111 | Ht 72.0 in | Wt 214.0 lb

## 2023-02-09 DIAGNOSIS — F0390 Unspecified dementia without behavioral disturbance: Secondary | ICD-10-CM

## 2023-02-09 MED ORDER — MEMANTINE HCL 10 MG PO TABS: ORAL_TABLET | ORAL | 11 refills | Status: DC

## 2023-02-09 NOTE — Patient Instructions (Signed)
It was a pleasure to see you today at our office.   Recommendations:  Follow up in 6  months  Continue memantine 1 tablet (10 mg) twice a day.      Whom to call:  Memory  decline, memory medications: Call our office [phone]   For psychiatric meds, mood meds: Please have your primary care physician manage these medications.     For assessment of decision of mental capacity and competency:  Call Dr. Erick Blinks, geriatric psychiatrist at [phone]    For guidance regarding [facility] and if placement were needed at the facility, contact Sidney Ace, Social Worker tel: [phone]  If you have any severe symptoms of a stroke, or other severe issues such as confusion,severe chills or fever, etc call 911 or go to the ER as you may need to be evaluated further      RECOMMENDATIONS FOR ALL PATIENTS WITH MEMORY PROBLEMS: 1. Continue to exercise (Recommend 30 minutes of walking everyday, or 3 hours every week) 2. Increase social interactions - continue going to [place] and enjoy social gatherings with friends and family 3. Eat healthy, avoid fried foods and eat more fruits and vegetables 4. Maintain adequate blood pressure, blood sugar, and blood cholesterol level. Reducing the risk of stroke and cardiovascular disease also helps promoting better memory. 5. Avoid stressful situations. Live a simple life and avoid aggravations. Organize your time and prepare for the next day in anticipation. 6. Sleep well, avoid any interruptions of sleep and avoid any distractions in the bedroom that may interfere with adequate sleep quality 7. Avoid sugar, avoid sweets as there is a strong link between excessive sugar intake, diabetes, and cognitive impairment We discussed the Mediterranean diet, which has been shown to help patients reduce the risk of progressive memory disorders and reduces cardiovascular risk. This includes eating fish, eat fruits and green leafy  vegetables, nuts like almonds and hazelnuts, walnuts, and also use olive oil. Avoid fast foods and fried foods as much as possible. Avoid sweets and sugar as sugar use has been linked to worsening of memory function.  There is always a concern of gradual progression of memory problems. If this is the case, then we may need to adjust level of care according to patient needs. Support, both to the patient and caregiver, should then be put into place.    The Alzheimer's Association is here all day, every day for people facing Alzheimer's disease through our free 24/7 Helpline: [phone]. The Helpline provides reliable information and support to all those who need assistance, such as individuals living with memory loss, Alzheimer's or other dementia, caregivers, health care professionals and the public.  Our highly trained and knowledgeable staff can help you with: Understanding memory loss, dementia and Alzheimer's  Medications and other treatment options  General information about aging and brain health  Skills to provide quality care and to find the best care from professionals  Legal, financial and living-arrangement decisions Our Helpline also features: Confidential care consultation provided by master's level clinicians who can help with decision-making support, crisis assistance and education on issues families face every day  Help in a caller's preferred language using our translation service that features more than 200 languages and dialects  Referrals to local community programs, services and ongoing support     FALL PRECAUTIONS: Be cautious when walking. Scan the area for obstacles that may increase the risk of trips and falls. When getting up in the mornings, sit up at  the edge of the bed for a few minutes before getting out of bed. Consider elevating the bed at the head end to avoid drop of blood pressure when getting up. Walk always in a well-lit room (use night lights in the walls).  Avoid area rugs or power cords from appliances in the middle of the walkways. Use a walker or a cane if necessary and consider physical therapy for balance exercise. Get your eyesight checked regularly.  FINANCIAL OVERSIGHT: Supervision, especially oversight when making financial decisions or transactions is also recommended.  HOME SAFETY: Consider the safety of the kitchen when operating appliances like stoves, microwave oven, and blender. Consider having supervision and share cooking responsibilities until no longer able to participate in those. Accidents with firearms and other hazards in the house should be identified and addressed as well.   ABILITY TO BE LEFT ALONE: If patient is unable to contact 911 operator, consider using LifeLine, or when the need is there, arrange for someone to stay with patients. Smoking is a fire hazard, consider supervision or cessation. Risk of wandering should be assessed by caregiver and if detected at any point, supervision and safe proof recommendations should be instituted.  MEDICATION SUPERVISION: Inability to self-administer medication needs to be constantly addressed. Implement a mechanism to ensure safe administration of the medications.   DRIVING: Regarding driving, in patients with progressive memory problems, driving will be impaired. We advise to have someone else do the driving if trouble finding directions or if minor accidents are reported. Independent driving assessment is available to determine safety of driving.   If you are interested in the driving assessment, you can contact the following:  [employer] in [place] [phone]  [facility] [phone]  [facility] [phone] or [phone]      Mediterranean Diet A Mediterranean diet refers to food and lifestyle choices that are based on the traditions of countries located on the [employer]. This  way of eating has been shown to help prevent certain conditions and improve outcomes for people who have chronic diseases, like kidney disease and heart disease. What are tips for following this plan? Lifestyle  Cook and eat meals together with your family, when possible. Drink enough fluid to keep your urine clear or pale yellow. Be physically active every day. This includes: Aerobic exercise like running or swimming. Leisure activities like gardening, walking, or housework. Get 7-8 hours of sleep each night. If recommended by your health care provider, drink red wine in moderation. This means 1 glass a day for nonpregnant women and 2 glasses a day for men. A glass of wine equals 5 oz (150 mL). Reading food labels  Check the serving size of packaged foods. For foods such as rice and pasta, the serving size refers to the amount of cooked product, not dry. Check the total fat in packaged foods. Avoid foods that have saturated fat or trans fats. Check the ingredients list for added sugars, such as corn syrup. Shopping  At the grocery store, buy most of your food from the areas near the walls of the store. This includes: Fresh fruits and vegetables (produce). Grains, beans, nuts, and seeds. Some of these may be available in unpackaged forms or large amounts (in bulk). Fresh seafood. Poultry and eggs. Low-fat dairy products. Buy whole ingredients instead of prepackaged foods. Buy fresh fruits and vegetables in-season from local farmers markets. Buy frozen fruits and vegetables in resealable bags. If you do not  have access to quality fresh seafood, buy precooked frozen shrimp or canned fish, such as tuna, salmon, or sardines. Buy small amounts of raw or cooked vegetables, salads, or olives from the deli or salad bar at your store. Stock your pantry so you always have certain foods on hand, such as olive oil, canned tuna, canned tomatoes, rice, pasta, and beans. Cooking  Cook foods with  extra-virgin olive oil instead of using butter or other vegetable oils. Have meat as a side dish, and have vegetables or grains as your main dish. This means having meat in small portions or adding small amounts of meat to foods like pasta or stew. Use beans or vegetables instead of meat in common dishes like chili or lasagna. Experiment with different cooking methods. Try roasting or broiling vegetables instead of steaming or sauteing them. Add frozen vegetables to soups, stews, pasta, or rice. Add nuts or seeds for added healthy fat at each meal. You can add these to yogurt, salads, or vegetable dishes. Marinate fish or vegetables using olive oil, lemon juice, garlic, and fresh herbs. Meal planning  Plan to eat 1 vegetarian meal one day each week. Try to work up to 2 vegetarian meals, if possible. Eat seafood 2 or more times a week. Have healthy snacks readily available, such as: Vegetable sticks with hummus. Greek yogurt. Fruit and nut trail mix. Eat balanced meals throughout the week. This includes: Fruit: 2-3 servings a day Vegetables: 4-5 servings a day Low-fat dairy: 2 servings a day Fish, poultry, or lean meat: 1 serving a day Beans and legumes: 2 or more servings a week Nuts and seeds: 1-2 servings a day Whole grains: 6-8 servings a day Extra-virgin olive oil: 3-4 servings a day Limit red meat and sweets to only a few servings a month What are my food choices? Mediterranean diet Recommended Grains: Whole-grain pasta. Brown rice. Bulgar wheat. Polenta. Couscous. Whole-wheat bread. Orpah Cobb. Vegetables: Artichokes. Beets. Broccoli. Cabbage. Carrots. Eggplant. Green beans. Chard. Kale. Spinach. Onions. Leeks. Peas. Squash. Tomatoes. Peppers. Radishes. Fruits: Apples. Apricots. Avocado. Berries. Bananas. Cherries. Dates. Figs. Grapes. Lemons. Melon. Oranges. Peaches. Plums. Pomegranate. Meats and other protein foods: Beans. Almonds. Sunflower seeds. Pine nuts. Peanuts. Cod.  Salmon. Scallops. Shrimp. Tuna. Tilapia. Clams. Oysters. Eggs. Dairy: Low-fat milk. Cheese. Greek yogurt. Beverages: Water. Red wine. Herbal tea. Fats and oils: Extra virgin olive oil. Avocado oil. Grape seed oil. Sweets and desserts: [place] yogurt with honey. Baked apples. Poached pears. Trail mix. Seasoning and other foods: Basil. Cilantro. Coriander. Cumin. Mint. Parsley. Sage. Rosemary. Tarragon. Garlic. Oregano. Thyme. Pepper. Balsalmic vinegar. Tahini. Hummus. Tomato sauce. Olives. Mushrooms. Limit these Grains: Prepackaged pasta or rice dishes. Prepackaged cereal with added sugar. Vegetables: Deep fried potatoes (french fries). Fruits: Fruit canned in syrup. Meats and other protein foods: Beef. Pork. Lamb. Poultry with skin. Hot dogs. Tomasa Blase. Dairy: Ice cream. Sour cream. Whole milk. Beverages: Juice. Sugar-sweetened soft drinks. Beer. Liquor and spirits. Fats and oils: Butter. Canola oil. Vegetable oil. Beef fat (tallow). Lard. Sweets and desserts: Cookies. Cakes. Pies. Candy. Seasoning and other foods: Mayonnaise. Premade sauces and marinades. The items listed may not be a complete list. Talk with your dietitian about what dietary choices are right for you. Summary The Mediterranean diet includes both food and lifestyle choices. Eat a variety of fresh fruits and vegetables, beans, nuts, seeds, and whole grains. Limit the amount of red meat and sweets that you eat. Talk with your health care provider about whether it is safe for you  to drink red wine in moderation. This means 1 glass a day for nonpregnant women and 2 glasses a day for men. A glass of wine equals 5 oz (150 mL). This information is not intended to replace advice given to you by your health care provider. Make sure you discuss any questions you have with your health care provider. Document Released: 10/04/2015 Document Revised: 11/06/2015 Document Reviewed: 10/04/2015 Elsevier Interactive Patient Education  2017 [employer].   We have sent a referral to [facility] for your MRI and they will call you directly to schedule your appointment. They are located at [address] [facility]. If you need to contact them directly please call [phone].  Your provider has requested that you have labwork completed today. Please go to [facility] ([id number]) on the second floor of this building before leaving the office today. You do not need to check in. If you are not called within 15 minutes please check with the front desk.

## 2023-02-09 NOTE — Progress Notes (Signed)
Assessment/Plan:   Dementia likely due to Alzheimer's disease  Edward Morgan is a very pleasant 72 y.o. RH male with a history of hypertension, hyperlipidemia, CKD3 seen today in follow up for memory loss. Patient is currently on memantine 10 mg bid, tolerating well. Memory is stable  .Patient is able to participate on his ADLs  without difficulties. Mood is good. He is not very active, declines PT.      Follow up in  6 months. Continue Memantine 10 mg twice daily. Side effects were discussed  Recommend good control of her cardiovascular risk factors. Patient informed of BP readings. Recommend increasing activity  Continue to control mood as per PCP     Subjective:    This patient is here alone.  Previous records as well as any outside records available were reviewed prior to todays visit. Patient was last seen on 08/21/22 with MMSE 21/30     Any changes in memory since last visit? "About the same". Patient has some difficulty remembering recent conversations and people names but able to recognize their faces. Likes to watch sports on TV, doe snot like to do any brain games repeats oneself?  Endorsed Disoriented when walking into a room?  "Sometimes I get confused where to go in the house, might go to the wrong room" Leaving objects?  "All the time". "I lose everything"  Wandering behavior?  Denies.   Any personality changes since last visit?  denies   Any worsening depression?:  Denies.   Hallucinations or paranoia?  Denies.   Seizures? denies    Any sleep changes?  Sleeps well. Denies vivid dreams, REM behavior or sleepwalking   Sleep apnea?   Denies.   Any hygiene concerns? Denies.  Independent of bathing and dressing?  Endorsed  Does the patient needs help with medications? Wife is in charge   Who is in charge of the finances? Wife  is in charge, she is POA     Any changes in appetite?  Denies.     Patient have trouble swallowing? Denies.  Does the patient cook?  No Any headaches?   Better controlled   Chronic back pain  denies   Ambulates with difficulty?  Uses a cane for stability (R) knee arthritis, he reports that he ambulates as tolerated.  Recent falls or head injuries? Had a mechanical fall after tripping over an obstacle (carpet) hitting the L frontal area, no LOC. Did not go tot he hospital.     Unilateral weakness, numbness or tingling? denies   Any tremors?  Denies   Any anosmia? Endorsed "for a long time" Any incontinence of urine?  Endorsed, especially at night, he has to get up a few times to urinate  Any bowel dysfunction?   Denies      Patient lives with wife    Does the patient drive? No longer drives     Initial visit 10/18/2021 How long did patient have memory difficulties?  Patient reports that his memory issues have been present for the last 2-3 months -however he is wife reports it has been about 2-3 years "if not more ".  He forgets recent conversations, and sometimes he does not answer what he is being asked.  Both short-term and long-term memory are affected. Patient lives with: Spouse  repeats oneself? endorsed, such as "where we going "several times.  "Not so much about telling the same stories " Disoriented when walking into a room? Endorsed.he has shown difficulty finding rooms inside  the house such as the bedroom or bathroom.  Sometimes he has "accidents ", thinking that is the bathroom and he urinates in the hallway.   Leaving objects in unusual places? Endorsed.  "He moves stuff everywhere "and wife cannot find it  Ambulates  with difficulty?  Occasionally he has balance problems.  His daughter states that he begins walking, then the steps get smaller, if the distance is long, and then he is at risk of falling.  Occasionally he uses a cane but not all the time. Recent falls? 2 nights ago he fell from the bed Any head injuries? 6-7 y he had a hemorrhagic stroke.  No apparent residuals according to the family. History of  seizures?   Patient denies   Wandering behavior?  Patient denies his wife reports that "he just stands there ". Recently he took the wrong bus. Patient drives?   Patient no longer drives    Any mood changes such irritability agitation?  Endorsed. "He is more hateful that it used to be, especially over the last 2 months"-his wife says Any history of depression?:  denies   Hallucinations?  Patient denies   Paranoia?  Patient denies   Patient reports that sleeps "not well, roams around the house "denies vivid dreams, REM behavior or sleepwalking    History of sleep apnea?  Patient denies   Any hygiene concerns?  Endorsed especially over the last 2 months. He does not want to take a shower  Independent of bathing and dressing?  Endorsed.  His wife lines up the clothing Does the patient needs help with medications?  Wife in charge  Who is in charge of the finances?  Patient is in charge but she needs a POA because it's getting worse.  They are in the process of doing so. Any changes in appetite?  He doesn't  eat like he used to, he may be forgetting to eat  Patient have trouble swallowing? Patient denies   Does the patient cook?  Patient denies   Any kitchen accidents such as leaving the stove on? He burns my food all the time, I wanna keep him out of the kitchen Any headaches?  Endorsed, occasionally in the forehead "usually do to blood pressure" Double vision? Patient denies   Any focal numbness or tingling?  Patient denies   Chronic back pain Patient denies   Unilateral weakness?  Patient denies   Any tremors?  Patient denies   Any history of anosmia?  For the last 2-3 years  Any incontinence of urine?  At night he urinates 3-4  night, sometimes in the hall" Any bowel dysfunction?   Patient denies   History of heavy alcohol intake?   "He's been a drank all his life, liquor, but he backed down " History of heavy tobacco use?  Patient denies   Family history of dementia?   Mother  Alzheimer's  disease           10/24/2021 MRI brain personally reviewed was remarkable for mild chronic small vessel ischemic disease progressive symptoms prior MRI in 2012, repeat demonstrated chronic lacunar infarct within the right aspect of the pons, and mild generalized parenchymal atrophy.   MRI brain 01/28/11 Hemorrhagic infarction affecting the left basal ganglia.  The hematoma measures 23 x 13 mm by MR.  Small hemorrhagic infarction affecting the posterior body of the caudate on the left. Moderate chronic small vessel changes elsewhere throughout the  brain.    MRA head 01/28/11 No major vessel  occlusion. 50% stenosis of the right middle cerebral artery in the 1 day to junction region.  PREVIOUS MEDICATIONS:   CURRENT MEDICATIONS:  Outpatient Encounter Medications as of 02/09/2023  Medication Sig   ammonium lactate (AMLACTIN) 12 % lotion Apply 1 Application topically as needed for dry skin.   aspirin EC 81 MG tablet Take by mouth.   atenolol (TENORMIN) 100 MG tablet Take 100 mg by mouth daily.   Buprenorphine HCl-Naloxone HCl 8-2 MG FILM Place under the tongue 3 (three) times daily.   cetirizine (ZYRTEC) 10 MG tablet Take by mouth.   cloNIDine (CATAPRES) 0.2 MG tablet Take 1 tablet (0.2 mg total) by mouth 2 (two) times daily.   esomeprazole (NEXIUM) 40 MG capsule TAKE ONE CAPSULE BY MOUTH ONCE DAILY.   fluticasone (FLONASE) 50 MCG/ACT nasal spray Place 1 spray into both nostrils daily.   furosemide (LASIX) 20 MG tablet Take 4 tablets (80 mg total) by mouth daily.   lisinopril (ZESTRIL) 20 MG tablet TAKE ONE TABLET BY MOUTH ONCE DAILY FOR BLOOD PRESSURE   Multiple Vitamin (MULTIVITAMIN WITH MINERALS) TABS tablet Take 1 tablet by mouth daily.   pravastatin (PRAVACHOL) 20 MG tablet TAKE ONE TABLET BY MOUTH ONCE DAILY FOR CHOLESTEROL   tamsulosin (FLOMAX) 0.4 MG CAPS capsule TAKE ONE CAPSULE BY MOUTH ONCE DAILY   [DISCONTINUED] memantine (NAMENDA) 10 MG tablet Take 1 tablet twice a day   memantine  (NAMENDA) 10 MG tablet Take 1 tablet twice a day   No facility-administered encounter medications on file as of 02/09/2023.       08/21/2022   12:00 PM  MMSE - Mini Mental State Exam  Orientation to time 4  Orientation to Place 4  Registration 3  Attention/ Calculation 0  Recall 2  Language- name 2 objects 2  Language- repeat 1  Language- follow 3 step command 3  Language- read & follow direction 1  Write a sentence 1  Copy design 0  Total score 21      10/18/2021    8:00 AM  Montreal Cognitive Assessment   Visuospatial/ Executive (0/5) 0  Naming (0/3) 3  Attention: Read list of digits (0/2) 0  Attention: Read list of letters (0/1) 1  Attention: Serial 7 subtraction starting at 100 (0/3) 0  Language: Repeat phrase (0/2) 1  Language : Fluency (0/1) 0  Abstraction (0/2) 0  Delayed Recall (0/5) 0  Orientation (0/6) 5  Total 10  Adjusted Score (based on education) 11    Objective:     PHYSICAL EXAMINATION:    VITALS:   Vitals:   02/09/23 0918  BP: (!) 145/68  Pulse: (!) 111  SpO2: 98%  Weight: 214 lb (97.1 kg)  Height: 6' (1.829 m)    GEN:  The patient appears stated age and is in NAD. HEENT:  Normocephalic, atraumatic.   Neurological examination:  General: NAD, well-groomed, appears stated age. Orientation: The patient is alert. Oriented to person, place and date Cranial nerves: There is good facial symmetry.The speech is fluent and clear. No aphasia or dysarthria. Fund of knowledge is appropriate. Recent and remote memory are impaired. Attention and concentration are reduced.  Able to name objects and repeat phrases.  Hearing is intact to conversational tone.   Sensation: Sensation is intact to light touch throughout Motor: Strength is at least antigravity x4. DTR's 2/4 in UE/LE     Movement examination: Tone: There is normal tone in the UE/LE Abnormal movements:  no tremor.  No myoclonus.  No asterixis.   Coordination:  There is no decremation with  RAM's. Normal finger to nose  Gait and Station: The patient has no difficulty arising out of a deep-seated chair without the use of the hands.  The patient's stride length is good but sow due to chronic R knee pain  Gait is cautious and narrow uses a R cane for stability.    Thank you for allowing [place] the opportunity to participate in the care of this nice patient. Please do not hesitate to contact us for any questions or concerns.   Total time spent on today's visit was 26 minutes dedicated to this patient today, preparing to see patient, examining the patient, ordering tests and/or medications and counseling the patient, documenting clinical information in the EHR or other health record, independently interpreting results and communicating results to the patient/family, discussing treatment and goals, answering patient's questions and coordinating care.  Cc:  Nestor Ramp, MD  Marlowe Kays 02/09/2023 10:19 AM

## 2023-02-13 ENCOUNTER — Ambulatory Visit: Payer: 59 | Admitting: Podiatry

## 2023-02-16 ENCOUNTER — Other Ambulatory Visit: Payer: Self-pay | Admitting: Family Medicine

## 2023-02-23 ENCOUNTER — Other Ambulatory Visit: Payer: Self-pay | Admitting: Family Medicine

## 2023-02-27 DIAGNOSIS — E7800 Pure hypercholesterolemia, unspecified: Secondary | ICD-10-CM | POA: Diagnosis not present

## 2023-02-27 DIAGNOSIS — G8929 Other chronic pain: Secondary | ICD-10-CM | POA: Diagnosis not present

## 2023-02-27 DIAGNOSIS — Z79899 Other long term (current) drug therapy: Secondary | ICD-10-CM | POA: Diagnosis not present

## 2023-02-27 DIAGNOSIS — Z5181 Encounter for therapeutic drug level monitoring: Secondary | ICD-10-CM | POA: Diagnosis not present

## 2023-02-27 DIAGNOSIS — M545 Low back pain, unspecified: Secondary | ICD-10-CM | POA: Diagnosis not present

## 2023-02-27 DIAGNOSIS — N184 Chronic kidney disease, stage 4 (severe): Secondary | ICD-10-CM | POA: Diagnosis not present

## 2023-02-27 DIAGNOSIS — I10 Essential (primary) hypertension: Secondary | ICD-10-CM | POA: Diagnosis not present

## 2023-02-27 DIAGNOSIS — Z79891 Long term (current) use of opiate analgesic: Secondary | ICD-10-CM | POA: Diagnosis not present

## 2023-02-27 DIAGNOSIS — R7303 Prediabetes: Secondary | ICD-10-CM | POA: Diagnosis not present

## 2023-03-02 ENCOUNTER — Encounter: Payer: Self-pay | Admitting: Podiatry

## 2023-03-02 ENCOUNTER — Ambulatory Visit (INDEPENDENT_AMBULATORY_CARE_PROVIDER_SITE_OTHER): Payer: 59 | Admitting: Podiatry

## 2023-03-02 DIAGNOSIS — B351 Tinea unguium: Secondary | ICD-10-CM | POA: Diagnosis not present

## 2023-03-02 DIAGNOSIS — M79675 Pain in left toe(s): Secondary | ICD-10-CM | POA: Diagnosis not present

## 2023-03-02 DIAGNOSIS — N184 Chronic kidney disease, stage 4 (severe): Secondary | ICD-10-CM

## 2023-03-02 DIAGNOSIS — M79674 Pain in right toe(s): Secondary | ICD-10-CM | POA: Diagnosis not present

## 2023-03-02 NOTE — Progress Notes (Signed)
This patient returns to my office for at risk foot care.  This patient requires this care by a professional since this patient will be at risk due to having dementia, right foot drop and CKD.  This patient is unable to cut nails himself since the patient cannot reach his nails.These nails are painful walking and wearing shoes.  This patient presents for at risk foot care today.  General Appearance  Alert, conversant and in no acute stress.  Vascular  Dorsalis pedis and posterior tibial  pulses are palpable  bilaterally.  Capillary return is within normal limits  bilaterally. Temperature is within normal limits  bilaterally.  Neurologic  Senn-Weinstein monofilament wire test within normal limits  bilaterally. Muscle power within normal limits bilaterally.  Nails Thick disfigured discolored nails with subungual debris  from hallux to fifth toes bilaterally. No evidence of bacterial infection or drainage bilaterally.  Orthopedic  No limitations of motion  feet .  No crepitus or effusions noted.  HAV  B/L.  Skin  normotropic skin with no porokeratosis noted bilaterally.  No signs of infections or ulcers noted.     Onychomycosis  Pain in right toes  Pain in left toes  Consent was obtained for treatment procedures.   Mechanical debridement of nails 1-5  bilaterally performed with a nail nipper.  Filed with dremel without incident.    Return office visit     3 months                 Told patient to return for periodic foot care and evaluation due to potential at risk complications.   Helane Gunther DPM

## 2023-03-03 DIAGNOSIS — Z79899 Other long term (current) drug therapy: Secondary | ICD-10-CM | POA: Diagnosis not present

## 2023-03-30 DIAGNOSIS — E7800 Pure hypercholesterolemia, unspecified: Secondary | ICD-10-CM | POA: Diagnosis not present

## 2023-03-30 DIAGNOSIS — I10 Essential (primary) hypertension: Secondary | ICD-10-CM | POA: Diagnosis not present

## 2023-03-30 DIAGNOSIS — R7303 Prediabetes: Secondary | ICD-10-CM | POA: Diagnosis not present

## 2023-03-30 DIAGNOSIS — Z79899 Other long term (current) drug therapy: Secondary | ICD-10-CM | POA: Diagnosis not present

## 2023-03-30 DIAGNOSIS — Z5181 Encounter for therapeutic drug level monitoring: Secondary | ICD-10-CM | POA: Diagnosis not present

## 2023-03-30 DIAGNOSIS — Z79891 Long term (current) use of opiate analgesic: Secondary | ICD-10-CM | POA: Diagnosis not present

## 2023-04-01 DIAGNOSIS — Z79899 Other long term (current) drug therapy: Secondary | ICD-10-CM | POA: Diagnosis not present

## 2023-04-02 ENCOUNTER — Emergency Department (HOSPITAL_BASED_OUTPATIENT_CLINIC_OR_DEPARTMENT_OTHER): Payer: 59

## 2023-04-02 ENCOUNTER — Encounter (HOSPITAL_BASED_OUTPATIENT_CLINIC_OR_DEPARTMENT_OTHER): Payer: Self-pay | Admitting: Family Medicine

## 2023-04-02 ENCOUNTER — Other Ambulatory Visit: Payer: Self-pay

## 2023-04-02 ENCOUNTER — Observation Stay (HOSPITAL_BASED_OUTPATIENT_CLINIC_OR_DEPARTMENT_OTHER)
Admission: EM | Admit: 2023-04-02 | Discharge: 2023-04-04 | Disposition: A | Discharge status: Home Health | Payer: 59 | Attending: Internal Medicine | Admitting: Internal Medicine

## 2023-04-02 DIAGNOSIS — Z8673 Personal history of transient ischemic attack (TIA), and cerebral infarction without residual deficits: Secondary | ICD-10-CM | POA: Insufficient documentation

## 2023-04-02 DIAGNOSIS — G309 Alzheimer's disease, unspecified: Secondary | ICD-10-CM | POA: Diagnosis not present

## 2023-04-02 DIAGNOSIS — Z9181 History of falling: Secondary | ICD-10-CM | POA: Insufficient documentation

## 2023-04-02 DIAGNOSIS — D649 Anemia, unspecified: Secondary | ICD-10-CM | POA: Diagnosis present

## 2023-04-02 DIAGNOSIS — F0280 Dementia in other diseases classified elsewhere without behavioral disturbance: Secondary | ICD-10-CM | POA: Diagnosis not present

## 2023-04-02 DIAGNOSIS — R410 Disorientation, unspecified: Secondary | ICD-10-CM | POA: Insufficient documentation

## 2023-04-02 DIAGNOSIS — I130 Hypertensive heart and chronic kidney disease with heart failure and stage 1 through stage 4 chronic kidney disease, or unspecified chronic kidney disease: Secondary | ICD-10-CM | POA: Diagnosis not present

## 2023-04-02 DIAGNOSIS — E785 Hyperlipidemia, unspecified: Secondary | ICD-10-CM | POA: Diagnosis present

## 2023-04-02 DIAGNOSIS — Z7982 Long term (current) use of aspirin: Secondary | ICD-10-CM | POA: Diagnosis not present

## 2023-04-02 DIAGNOSIS — R4781 Slurred speech: Secondary | ICD-10-CM | POA: Insufficient documentation

## 2023-04-02 DIAGNOSIS — I10 Essential (primary) hypertension: Secondary | ICD-10-CM | POA: Diagnosis present

## 2023-04-02 DIAGNOSIS — Z79899 Other long term (current) drug therapy: Secondary | ICD-10-CM | POA: Diagnosis not present

## 2023-04-02 DIAGNOSIS — I5032 Chronic diastolic (congestive) heart failure: Secondary | ICD-10-CM | POA: Insufficient documentation

## 2023-04-02 DIAGNOSIS — M545 Low back pain, unspecified: Secondary | ICD-10-CM | POA: Diagnosis not present

## 2023-04-02 DIAGNOSIS — R079 Chest pain, unspecified: Secondary | ICD-10-CM | POA: Diagnosis not present

## 2023-04-02 DIAGNOSIS — I5189 Other ill-defined heart diseases: Secondary | ICD-10-CM

## 2023-04-02 DIAGNOSIS — N184 Chronic kidney disease, stage 4 (severe): Secondary | ICD-10-CM | POA: Diagnosis present

## 2023-04-02 DIAGNOSIS — G894 Chronic pain syndrome: Secondary | ICD-10-CM | POA: Diagnosis not present

## 2023-04-02 DIAGNOSIS — G9340 Encephalopathy, unspecified: Principal | ICD-10-CM | POA: Diagnosis present

## 2023-04-02 DIAGNOSIS — Z1152 Encounter for screening for COVID-19: Secondary | ICD-10-CM | POA: Diagnosis not present

## 2023-04-02 DIAGNOSIS — F0390 Unspecified dementia without behavioral disturbance: Secondary | ICD-10-CM | POA: Diagnosis present

## 2023-04-02 DIAGNOSIS — G8929 Other chronic pain: Secondary | ICD-10-CM | POA: Diagnosis present

## 2023-04-02 DIAGNOSIS — R531 Weakness: Principal | ICD-10-CM

## 2023-04-02 DIAGNOSIS — R4182 Altered mental status, unspecified: Secondary | ICD-10-CM | POA: Diagnosis present

## 2023-04-02 DIAGNOSIS — I6782 Cerebral ischemia: Secondary | ICD-10-CM | POA: Diagnosis not present

## 2023-04-02 DIAGNOSIS — K219 Gastro-esophageal reflux disease without esophagitis: Secondary | ICD-10-CM | POA: Diagnosis not present

## 2023-04-02 LAB — COMPREHENSIVE METABOLIC PANEL
ALT: 9 U/L (ref 0–44)
AST: 16 U/L (ref 15–41)
Albumin: 3.9 g/dL (ref 3.5–5.0)
Alkaline Phosphatase: 75 U/L (ref 38–126)
Anion gap: 6 (ref 5–15)
BUN: 40 mg/dL — ABNORMAL HIGH (ref 8–23)
CO2: 29 mmol/L (ref 22–32)
Calcium: 8.7 mg/dL — ABNORMAL LOW (ref 8.9–10.3)
Chloride: 104 mmol/L (ref 98–111)
Creatinine, Ser: 3.23 mg/dL — ABNORMAL HIGH (ref 0.61–1.24)
GFR, Estimated: 19 mL/min — ABNORMAL LOW (ref >60)
Glucose, Bld: 90 mg/dL (ref 70–99)
Potassium: 4.2 mmol/L (ref 3.5–5.1)
Sodium: 139 mmol/L (ref 135–145)
Total Bilirubin: 0.5 mg/dL (ref 0.0–1.2)
Total Protein: 6.9 g/dL (ref 6.5–8.1)

## 2023-04-02 LAB — CBC WITH DIFFERENTIAL/PLATELET
Abs Immature Granulocytes: 0.02 10*3/uL (ref 0.00–0.07)
Basophils Absolute: 0 10*3/uL (ref 0.0–0.1)
Basophils Relative: 0 %
Eosinophils Absolute: 0.1 10*3/uL (ref 0.0–0.5)
Eosinophils Relative: 3 %
HCT: 34.7 % — ABNORMAL LOW (ref 39.0–52.0)
Hemoglobin: 11.1 g/dL — ABNORMAL LOW (ref 13.0–17.0)
Immature Granulocytes: 0 %
Lymphocytes Relative: 21 %
Lymphs Abs: 1.1 10*3/uL (ref 0.7–4.0)
MCH: 29.8 pg (ref 26.0–34.0)
MCHC: 32 g/dL (ref 30.0–36.0)
MCV: 93.3 fL (ref 80.0–100.0)
Monocytes Absolute: 0.5 10*3/uL (ref 0.1–1.0)
Monocytes Relative: 9 %
Neutro Abs: 3.5 10*3/uL (ref 1.7–7.7)
Neutrophils Relative %: 67 %
Platelets: 246 10*3/uL (ref 150–400)
RBC: 3.72 MIL/uL — ABNORMAL LOW (ref 4.22–5.81)
RDW: 13.6 % (ref 11.5–15.5)
WBC: 5.3 10*3/uL (ref 4.0–10.5)
nRBC: 0 % (ref 0.0–0.2)

## 2023-04-02 LAB — ACETAMINOPHEN LEVEL: Acetaminophen (Tylenol), Serum: <10 ug/mL — ABNORMAL LOW (ref 10–30)

## 2023-04-02 LAB — URINALYSIS, W/ REFLEX TO CULTURE (INFECTION SUSPECTED)
Bacteria, UA: NONE SEEN
Bilirubin Urine: NEGATIVE
Glucose, UA: NEGATIVE mg/dL
Ketones, ur: NEGATIVE mg/dL
Leukocytes,Ua: NEGATIVE
Nitrite: NEGATIVE
Protein, ur: 100 mg/dL — AB
Specific Gravity, Urine: 1.01 (ref 1.005–1.030)
pH: 7 (ref 5.0–8.0)

## 2023-04-02 LAB — RESP PANEL BY RT-PCR (RSV, FLU A&B, COVID)  RVPGX2
Influenza A by PCR: NEGATIVE
Influenza B by PCR: NEGATIVE
Resp Syncytial Virus by PCR: NEGATIVE
SARS Coronavirus 2 by RT PCR: NEGATIVE

## 2023-04-02 LAB — ETHANOL: Alcohol, Ethyl (B): <10 mg/dL (ref <10)

## 2023-04-02 LAB — SALICYLATE LEVEL: Salicylate Lvl: <7 mg/dL — ABNORMAL LOW (ref 7.0–30.0)

## 2023-04-02 LAB — AMMONIA: Ammonia: 21 umol/L (ref 9–35)

## 2023-04-02 LAB — MAGNESIUM: Magnesium: 2.4 mg/dL (ref 1.7–2.4)

## 2023-04-02 MED ORDER — TAMSULOSIN HCL 0.4 MG PO CAPS
0.4000 mg | ORAL_CAPSULE | Freq: Every day | ORAL | Status: DC
  Administered 2023-04-03 – 2023-04-04 (×2): 0.4 mg via ORAL
  Filled 2023-04-02 (×2): qty 1

## 2023-04-02 MED ORDER — ASPIRIN 81 MG PO TBEC
81.0000 mg | DELAYED_RELEASE_TABLET | Freq: Every day | ORAL | Status: DC
  Administered 2023-04-03 – 2023-04-04 (×2): 81 mg via ORAL
  Filled 2023-04-02 (×2): qty 1

## 2023-04-02 MED ORDER — ATENOLOL 50 MG PO TABS
100.0000 mg | ORAL_TABLET | Freq: Every day | ORAL | Status: DC
  Administered 2023-04-03 – 2023-04-04 (×2): 100 mg via ORAL
  Filled 2023-04-02: qty 4
  Filled 2023-04-02: qty 2

## 2023-04-02 MED ORDER — LORATADINE 10 MG PO TABS
10.0000 mg | ORAL_TABLET | Freq: Every day | ORAL | Status: DC
  Administered 2023-04-03 – 2023-04-04 (×2): 10 mg via ORAL
  Filled 2023-04-02 (×2): qty 1

## 2023-04-02 MED ORDER — LISINOPRIL 20 MG PO TABS
20.0000 mg | ORAL_TABLET | Freq: Every day | ORAL | Status: DC
  Administered 2023-04-03 – 2023-04-04 (×2): 20 mg via ORAL
  Filled 2023-04-02: qty 2
  Filled 2023-04-02: qty 1

## 2023-04-02 MED ORDER — CLONIDINE HCL 0.2 MG PO TABS
0.2000 mg | ORAL_TABLET | Freq: Two times a day (BID) | ORAL | Status: DC
  Administered 2023-04-03 – 2023-04-04 (×3): 0.2 mg via ORAL
  Filled 2023-04-02: qty 1
  Filled 2023-04-02: qty 2
  Filled 2023-04-02: qty 1

## 2023-04-02 MED ORDER — MEMANTINE HCL 10 MG PO TABS
10.0000 mg | ORAL_TABLET | Freq: Two times a day (BID) | ORAL | Status: DC
  Administered 2023-04-03 – 2023-04-04 (×3): 10 mg via ORAL
  Filled 2023-04-02 (×3): qty 1

## 2023-04-02 MED ORDER — PANTOPRAZOLE SODIUM 40 MG PO TBEC
40.0000 mg | DELAYED_RELEASE_TABLET | Freq: Every day | ORAL | Status: DC
  Administered 2023-04-03 – 2023-04-04 (×2): 40 mg via ORAL
  Filled 2023-04-02 (×2): qty 1

## 2023-04-02 MED ORDER — BUPRENORPHINE HCL-NALOXONE HCL 8-2 MG SL SUBL
1.0000 | SUBLINGUAL_TABLET | Freq: Three times a day (TID) | SUBLINGUAL | Status: DC | PRN
  Administered 2023-04-03 – 2023-04-04 (×2): 1 via SUBLINGUAL
  Filled 2023-04-02 (×2): qty 1

## 2023-04-02 MED ORDER — FUROSEMIDE 40 MG PO TABS
80.0000 mg | ORAL_TABLET | Freq: Every day | ORAL | Status: DC
  Administered 2023-04-03 – 2023-04-04 (×2): 80 mg via ORAL
  Filled 2023-04-02 (×2): qty 2

## 2023-04-02 MED ORDER — HYDRALAZINE HCL 20 MG/ML IJ SOLN
10.0000 mg | Freq: Once | INTRAMUSCULAR | Status: AC
  Administered 2023-04-02: 10 mg via INTRAVENOUS
  Filled 2023-04-02: qty 1

## 2023-04-02 MED ORDER — PRAVASTATIN SODIUM 20 MG PO TABS
20.0000 mg | ORAL_TABLET | Freq: Every day | ORAL | Status: DC
  Administered 2023-04-03 – 2023-04-04 (×2): 20 mg via ORAL
  Filled 2023-04-02 (×2): qty 1

## 2023-04-02 NOTE — ED Provider Notes (Signed)
 [place] EMERGENCY DEPARTMENT AT [facility] Provider Note   CSN: [id number] Arrival date & time: 04/02/23  1728     History  Chief Complaint  Patient presents with   Altered Mental Status    Edward Morgan is a 73 y.o. male with past medical history significant for CKD, previous intracerebral hemorrhage, chronic pain, Alzheimer's dementia who was brought in by family for altered mental status since yesterday, reportedly normally alert and oriented x 4, alert and oriented only to self in triage.  Wife reports some slurred speech since yesterday around 2 PM.  Reports that he has been acting confused, grabbing and putting away clothes, and not talking like he normally would. Report generalized weakness, frequent recent falls.   Altered Mental Status      Home Medications Prior to Admission medications   Medication Sig Start Date End Date Taking? Authorizing Provider  ammonium lactate  (AMLACTIN) 12 % lotion Apply 1 Application topically as needed for dry skin. 02/12/22   Tobie Franky SQUIBB, DPM  aspirin  EC 81 MG tablet Take by mouth.   Yes [provider]  atenolol  (TENORMIN ) 100 MG tablet Take 100 mg by mouth daily. 07/09/21  Yes [provider]  Buprenorphine  HCl-Naloxone  HCl 8-2 MG FILM Place under the tongue 3 (three) times daily. 08/08/21  Yes [provider]  cetirizine  (ZYRTEC ) 10 MG tablet Take by mouth. 07/17/20  Yes [provider]  cloNIDine  (CATAPRES ) 0.2 MG tablet Take 1 tablet (0.2 mg total) by mouth 2 (two) times daily. 12/10/22  Yes Rosalynn Camie LITTIE, MD  esomeprazole  (NEXIUM ) 40 MG capsule TAKE ONE CAPSULE BY MOUTH ONCE DAILY. 09/02/22  Yes Rosalynn Camie LITTIE, MD  fluticasone  (FLONASE ) 50 MCG/ACT nasal spray Place 1 spray into both nostrils daily. 12/19/22   Rosalynn Camie LITTIE, MD  furosemide  (LASIX ) 20 MG tablet Take 4 tablets (80 mg total) by mouth daily. 10/12/22  Yes Rosalynn Camie LITTIE, MD  lisinopril  (ZESTRIL ) 20 MG tablet TAKE ONE TABLET BY MOUTH  ONCE DAILY FOR BLOOD PRESSURE 02/23/23  Yes Rosalynn Camie LITTIE, MD  memantine  (NAMENDA ) 10 MG tablet Take 1 tablet twice a day 02/09/23  Yes Wertman, Sara E, PA-C  Multiple Vitamin (MULTIVITAMIN WITH MINERALS) TABS tablet Take 1 tablet by mouth daily.   Yes [provider]  pravastatin  (PRAVACHOL ) 20 MG tablet TAKE ONE TABLET BY MOUTH ONCE DAILY FOR CHOLESTEROL 09/01/22  Yes Rosalynn Camie LITTIE, MD  tamsulosin  (FLOMAX ) 0.4 MG CAPS capsule TAKE ONE CAPSULE BY MOUTH ONCE DAILY 02/23/23  Yes Rosalynn Camie LITTIE, MD      Allergies    Penicillins    Review of Systems   Review of Systems  All other systems reviewed and are negative.   Physical Exam Updated Vital Signs BP (!) 198/107   Pulse 83   Temp 98 F (36.7 C) (Rectal)   Resp (!) 21   Wt 97.1 kg   SpO2 100%   BMI 29.03 kg/m  Physical Exam Vitals and nursing note reviewed.  Constitutional:      General: He is not in acute distress.    Appearance: Normal appearance.  HENT:     Head: Normocephalic and atraumatic.  Eyes:     General:        Right eye: No discharge.        Left eye: No discharge.  Cardiovascular:     Rate and Rhythm: Normal rate and regular rhythm.     Heart sounds: No murmur heard.  No friction rub. No gallop.  Pulmonary:     Effort: Pulmonary effort is normal.     Breath sounds: Normal breath sounds.  Abdominal:     General: Bowel sounds are normal.     Palpations: Abdomen is soft.  Skin:    General: Skin is warm and dry.     Capillary Refill: Capillary refill takes less than 2 seconds.  Neurological:     Mental Status: He is alert.     Comments: Oriented only to self, confused to time, place, situation.  Decreased strength of all extremities but does follow commands.  CN2 through 12 grossly intact.  Psychiatric:        Mood and Affect: Mood normal.        Behavior: Behavior normal.     ED Results / Procedures / Treatments   Labs (all labs ordered are listed, but only abnormal results are  displayed) Labs Reviewed  ACETAMINOPHEN  LEVEL - Abnormal; Notable for the following components:      Result Value   Acetaminophen  (Tylenol ), Serum <10 (*)    All other components within normal limits  COMPREHENSIVE METABOLIC PANEL - Abnormal; Notable for the following components:   BUN 40 (*)    Creatinine, Ser 3.23 (*)    Calcium 8.7 (*)    GFR, Estimated 19 (*)    All other components within normal limits  SALICYLATE LEVEL - Abnormal; Notable for the following components:   Salicylate Lvl <7.0 (*)    All other components within normal limits  CBC WITH DIFFERENTIAL/PLATELET - Abnormal; Notable for the following components:   RBC 3.72 (*)    Hemoglobin 11.1 (*)    HCT 34.7 (*)    All other components within normal limits  RESP PANEL BY RT-PCR (RSV, FLU A&B, COVID)  RVPGX2  AMMONIA  ETHANOL  MAGNESIUM   CBC WITH DIFFERENTIAL/PLATELET  URINALYSIS, W/ REFLEX TO CULTURE (INFECTION SUSPECTED)    EKG None  Radiology DG Chest Port 1 View Result Date: 04/02/2023 CLINICAL DATA:  Altered mental status.  Chest pain. EXAM: PORTABLE CHEST 1 VIEW COMPARISON:  09/02/2021 FINDINGS: Slightly shallow inspiration. Heart size and pulmonary vascularity are normal. Lungs are clear. No pleural effusion or pneumothorax. Mediastinal contours appear intact. Degenerative changes in the spine and shoulders. Postoperative change in the left shoulder. IMPRESSION: No active disease. Electronically Signed   By: Elsie Gravely M.D.   On: 04/02/2023 18:42   CT HEAD WO CONTRAST ( ) Result Date: 04/02/2023 CLINICAL DATA:  Altered mental status. EXAM: CT HEAD WITHOUT CONTRAST TECHNIQUE: Contiguous axial images were obtained from the base of the skull through the vertex without intravenous contrast. RADIATION DOSE REDUCTION: This exam was performed according to the departmental dose-optimization program which includes automated exposure control, adjustment of the mA and/or kV according to patient size and/or use of  iterative reconstruction technique. COMPARISON:  Head CT dated 01/28/2011. FINDINGS: Brain: Moderate age-related atrophy and chronic microvascular ischemic changes. There is no acute intracranial hemorrhage. No mass effect or midline shift. No extra-axial fluid collection. Vascular: No hyperdense vessel or unexpected calcification. Skull: Normal. Negative for fracture or focal lesion. Sinuses/Orbits: No acute finding. Other: None IMPRESSION: 1. No acute intracranial pathology. 2. Moderate age-related atrophy and chronic microvascular ischemic changes. Electronically Signed   By: Vanetta Chou M.D.   On: 04/02/2023 18:21    Procedures Procedures    Medications Ordered in ED Medications  aspirin  EC tablet 81 mg (has no administration in time range)  atenolol  (TENORMIN ) tablet  100 mg (has no administration in time range)  buprenorphine -naloxone  (SUBOXONE ) 8-2 mg per SL tablet 1 tablet (has no administration in time range)  loratadine  (CLARITIN ) tablet 10 mg (has no administration in time range)  cloNIDine  (CATAPRES ) tablet 0.2 mg (has no administration in time range)  pantoprazole  sodium (PROTONIX ) 40 mg (has no administration in time range)  furosemide  (LASIX ) tablet 80 mg (has no administration in time range)  lisinopril  (ZESTRIL ) tablet 20 mg (has no administration in time range)  memantine  (NAMENDA ) tablet 10 mg (has no administration in time range)  pravastatin  (PRAVACHOL ) tablet 20 mg (has no administration in time range)  tamsulosin  (FLOMAX ) capsule 0.4 mg (has no administration in time range)  hydrALAZINE  (APRESOLINE ) injection 10 mg (10 mg Intravenous Given 04/02/23 2043)    ED Course/ Medical Decision Making/ A&P                                 Medical Decision Making  This patient is a 74 y.o. male  who presents to the ED for concern of weakness.   Differential diagnoses prior to evaluation: The emergent differential diagnosis includes, but is not limited to,  CVA, spinal  cord injury, ACS, arrhythmia, syncope, orthostatic hypotension, sepsis, hypoglycemia, hypoxia, electrolyte disturbance, endocrine disorder, anemia, environmental exposure, polypharmacy . This is not an exhaustive differential.   Past Medical History / Co-morbidities / Social History: Hypertension, chronic pain, Alzheimer's dementia, previous stroke  Additional history: Chart reviewed. Pertinent results include: Reviewed lab work, imaging from previous emergency department visits   Physical Exam: Physical exam performed. The pertinent findings include: diffusely altered, generalized weakness, no focal neurologic deficits.  No evidence of any cellulitis or other skin abnormality.  No significant tenderness palpation of abdomen, abdomen nondistended.  Quite hypertensive in the emergency department, blood pressure 223/90.  Stable oxygen saturation on room air, normal respiratory rate.  Afebrile rectal temperature.  Lab Tests/Imaging studies: I personally interpreted labs/imaging and the pertinent results include: CMP notable for elevated creatinine at 3.23, BUN 40, this is stable compared to his baseline CKD.  Normal ammonia, RVP negative for COVID, flu, RSV.  Normal magnesium .  CBC with differential with mild anemia, hemoglobin 11.1 otherwise unremarkable, notably with no leukocytosis.  I independently interpreted plain film chest x-ray as well as CT head without contrast.  CT head with chronic microvascular changes but no evidence of acute intracranial hemorrhage or stroke.  No intrathoracic abnormality noted on plain film chest x-ray.. I agree with the radiologist interpretation.  We have not been able to obtain a urine yet, he went to the bathroom but did not provide a urine sample.  Will continue to try to obtain urine from this patient.  No evidence of sepsis based on vitals and other labs however.  Cardiac monitoring: EKG obtained and interpreted by myself and attending physician which shows: NSR    Medications: I ordered medication including hydralazine  for BP.  I have reviewed the patients home medicines and have made adjustments as needed.  Consults: I spoke with the hospitalist, Dr. Charlton, who after discussion of patient's clinical condition, lab work, imaging and agrees to admission for altered mental status, generalized weakness.   Disposition: After consideration of the diagnostic results and the patients response to treatment, I feel that patient would benefit from admission for altered mental status and generalized weakness..    Final Clinical Impression(s) / ED Diagnoses Final diagnoses:  Generalized weakness  Encephalopathy acute    Rx / DC Orders ED Discharge Orders     None         Rosan Sherlean VEAR DEVONNA 04/02/23 2117    Lenor Hollering, MD 04/02/23 2356

## 2023-04-02 NOTE — ED Triage Notes (Signed)
 Family states patient has been altered since yesterday. A&Ox1 during triage. Baseline patient is A&Ox4 . Family states patient has had slurred speech since LKN  of 1400 on 03/31/22. No unilateral deficits.

## 2023-04-02 NOTE — Progress Notes (Signed)
 Plan of Care Note for accepted transfer   Patient: Edward Morgan MRN: [id number]   DOA: 04/02/2023  Facility requesting transfer: [facility]   Requesting Provider: Sherlean Carota, PA   Reason for transfer: AMS   Facility course: 73 yr old male with HTN, HLD, CKD 3B, and dementia who presents with 48 hours of increased confusion and generalized weakness.  Respiratory virus panel is negative and no acute findings seen on head CT.  He is severely hypertensive and was given IV hydralazine  in the ED.  UA is ordered but not yet collected.  Plan of care: The patient is accepted for admission to Progressive unit, at [facility]   Author: Evalene GORMAN Sprinkles, MD 04/02/2023  Check [email or web address] for on-call coverage.  Nursing staff, Please call [facility] Admits & Consults System-Wide number on Amion as soon as patient's arrival, so appropriate admitting provider can evaluate the pt.

## 2023-04-02 NOTE — ED Provider Triage Note (Signed)
 Emergency Medicine Provider Triage Evaluation Note  Edward Morgan , a 74 y.o. male  was evaluated in triage.  Pt complains of Altered mental status starting 2 pm yesterday. Slow to respond to commands. Slurred speech. Ambulating slowly.  Review of Systems  Positive: As above Negative: As above  Physical Exam  Wt 97.1 kg   BMI 29.03 kg/m  Gen:   Awake, no distress   Resp:  Normal effort MSK:   Moves extremities without difficulty Other:    Medical Decision Making  Medically screening exam initiated at 5:46 PM.  Appropriate orders placed.  Lamar LITTIE Broach was informed that the remainder of the evaluation will be completed by another provider, this initial triage assessment does not replace that evaluation, and the importance of remaining in the ED until their evaluation is complete.     Hildegard Loge, PA-C 04/02/23 1747

## 2023-04-03 ENCOUNTER — Observation Stay (HOSPITAL_COMMUNITY): Payer: 59

## 2023-04-03 DIAGNOSIS — D649 Anemia, unspecified: Secondary | ICD-10-CM | POA: Diagnosis present

## 2023-04-03 DIAGNOSIS — Z7982 Long term (current) use of aspirin: Secondary | ICD-10-CM | POA: Diagnosis not present

## 2023-04-03 DIAGNOSIS — I5032 Chronic diastolic (congestive) heart failure: Secondary | ICD-10-CM | POA: Diagnosis not present

## 2023-04-03 DIAGNOSIS — G309 Alzheimer's disease, unspecified: Secondary | ICD-10-CM | POA: Diagnosis not present

## 2023-04-03 DIAGNOSIS — I130 Hypertensive heart and chronic kidney disease with heart failure and stage 1 through stage 4 chronic kidney disease, or unspecified chronic kidney disease: Secondary | ICD-10-CM | POA: Diagnosis not present

## 2023-04-03 DIAGNOSIS — F0280 Dementia in other diseases classified elsewhere without behavioral disturbance: Secondary | ICD-10-CM | POA: Diagnosis not present

## 2023-04-03 DIAGNOSIS — M545 Low back pain, unspecified: Secondary | ICD-10-CM | POA: Diagnosis not present

## 2023-04-03 DIAGNOSIS — K219 Gastro-esophageal reflux disease without esophagitis: Secondary | ICD-10-CM | POA: Diagnosis not present

## 2023-04-03 DIAGNOSIS — G9340 Encephalopathy, unspecified: Secondary | ICD-10-CM | POA: Diagnosis not present

## 2023-04-03 DIAGNOSIS — R4781 Slurred speech: Secondary | ICD-10-CM | POA: Diagnosis not present

## 2023-04-03 DIAGNOSIS — G8929 Other chronic pain: Secondary | ICD-10-CM | POA: Diagnosis not present

## 2023-04-03 DIAGNOSIS — G894 Chronic pain syndrome: Secondary | ICD-10-CM | POA: Diagnosis not present

## 2023-04-03 DIAGNOSIS — Z79899 Other long term (current) drug therapy: Secondary | ICD-10-CM | POA: Diagnosis not present

## 2023-04-03 DIAGNOSIS — I5189 Other ill-defined heart diseases: Secondary | ICD-10-CM

## 2023-04-03 DIAGNOSIS — E785 Hyperlipidemia, unspecified: Secondary | ICD-10-CM | POA: Diagnosis not present

## 2023-04-03 DIAGNOSIS — Z8673 Personal history of transient ischemic attack (TIA), and cerebral infarction without residual deficits: Secondary | ICD-10-CM | POA: Diagnosis not present

## 2023-04-03 DIAGNOSIS — R4182 Altered mental status, unspecified: Secondary | ICD-10-CM | POA: Diagnosis present

## 2023-04-03 DIAGNOSIS — Z1152 Encounter for screening for COVID-19: Secondary | ICD-10-CM | POA: Diagnosis not present

## 2023-04-03 DIAGNOSIS — Z9181 History of falling: Secondary | ICD-10-CM | POA: Diagnosis not present

## 2023-04-03 DIAGNOSIS — N184 Chronic kidney disease, stage 4 (severe): Secondary | ICD-10-CM | POA: Diagnosis not present

## 2023-04-03 DIAGNOSIS — R410 Disorientation, unspecified: Secondary | ICD-10-CM | POA: Diagnosis not present

## 2023-04-03 LAB — CBC
HCT: 33.6 % — ABNORMAL LOW (ref 39.0–52.0)
Hemoglobin: 10.4 g/dL — ABNORMAL LOW (ref 13.0–17.0)
MCH: 28.9 pg (ref 26.0–34.0)
MCHC: 31 g/dL (ref 30.0–36.0)
MCV: 93.3 fL (ref 80.0–100.0)
Platelets: 224 10*3/uL (ref 150–400)
RBC: 3.6 MIL/uL — ABNORMAL LOW (ref 4.22–5.81)
RDW: 13.6 % (ref 11.5–15.5)
WBC: 4.1 10*3/uL (ref 4.0–10.5)
nRBC: 0 % (ref 0.0–0.2)

## 2023-04-03 LAB — BASIC METABOLIC PANEL
Anion gap: 7 (ref 5–15)
BUN: 38 mg/dL — ABNORMAL HIGH (ref 8–23)
CO2: 26 mmol/L (ref 22–32)
Calcium: 8.6 mg/dL — ABNORMAL LOW (ref 8.9–10.3)
Chloride: 106 mmol/L (ref 98–111)
Creatinine, Ser: 2.56 mg/dL — ABNORMAL HIGH (ref 0.61–1.24)
GFR, Estimated: 26 mL/min — ABNORMAL LOW (ref >60)
Glucose, Bld: 97 mg/dL (ref 70–99)
Potassium: 4.2 mmol/L (ref 3.5–5.1)
Sodium: 139 mmol/L (ref 135–145)

## 2023-04-03 MED ORDER — ACETAMINOPHEN 325 MG PO TABS: 650.0000 mg | ORAL_TABLET | Freq: Four times a day (QID) | ORAL | Status: DC | PRN

## 2023-04-03 MED ORDER — ONDANSETRON HCL 4 MG PO TABS: 4.0000 mg | ORAL_TABLET | Freq: Four times a day (QID) | ORAL | Status: DC | PRN

## 2023-04-03 MED ORDER — CLONIDINE HCL 0.1 MG PO TABS
0.2000 mg | ORAL_TABLET | Freq: Once | ORAL | Status: AC
  Administered 2023-04-03: 0.2 mg via ORAL
  Filled 2023-04-03: qty 2

## 2023-04-03 MED ORDER — ACETAMINOPHEN 650 MG RE SUPP: 650.0000 mg | Freq: Four times a day (QID) | RECTAL | Status: DC | PRN

## 2023-04-03 MED ORDER — ONDANSETRON HCL 4 MG/2ML IJ SOLN: 4.0000 mg | Freq: Four times a day (QID) | INTRAMUSCULAR | Status: DC | PRN

## 2023-04-03 MED ORDER — HYDRALAZINE HCL 25 MG PO TABS
25.0000 mg | ORAL_TABLET | Freq: Four times a day (QID) | ORAL | Status: DC | PRN
Start: 2023-04-03 — End: 2023-04-04
  Administered 2023-04-03: 25 mg via ORAL
  Filled 2023-04-03: qty 1

## 2023-04-03 NOTE — ED Notes (Signed)
 Provider aware of pt's elevated BP and will place order for appropriate treatment.

## 2023-04-03 NOTE — H&P (Signed)
 History and Physical    Patient: Edward Morgan FMW:[id number] DOB: 09/01/1950 DOA: 04/02/2023 DOS: the patient was seen and examined on 04/03/2023 PCP: Rosalynn Camie LITTIE, MD  Patient coming from: Home  Chief Complaint:  Chief Complaint  Patient presents with   Altered Mental Status   HPI: Edward Morgan is a 73 y.o. male with medical history significant of osteoarthritis, depression, GERD, H. pylori infection, treated hep C, hypertension, hyperlipidemia, history of CVA, Alzheimer's dementia who was brought to the emergency department due to altered mental status.  He does not remember exactly what happened.  I called his wife to update her and find out more, but had to leave message on voicemail.  He knows he is at [employer], knows who is the current president, but had some trouble saying the year, trying multiple times without being able to fully verbalize.  He is able to answer simple questions.  No headache, dyspnea, sore throat, chest, back or abdominal pain at the moment.  Lab work: Urinalysis was colorless with trace hemoglobin and protein of 100 mg/deciliter, but otherwise unremarkable.  Coronavirus, influenza and RSV PCR was negative.  CBC showed a white count of 5.3, hemoglobin 11.1 g/dL and platelets 753.  Unremarkable ammonia, acetaminophen , alcohol and salicylate level.  Magnesium  levels 2.4 mg/dL.  CMP showed a BUN of 40, creatinine 3.23 and corrected calcium of 8.8 mg/dL, the rest of the electrolytes and hepatic functions were normal.  Imaging: 1 view chest radiograph with normal heart size and pulmonary vascularity.  CT head with no acute intracranial pathology.  There is moderate age-related atrophy with chronic microvascular ischemic changes.   ED course: Initial vital signs were temperature 98.8 F, pulse 89, respiration 18, BP 219/89 mmHg O2 sat 99% on room air.  The patient received clonidine  0.2 mg p.o. x 1 and hydralazine  10 mg IVP x 1.  Review of Systems: As mentioned in the  history of present illness. All other systems reviewed and are negative. Past Medical History:  Diagnosis Date   Arthritis    Depression    GERD (gastroesophageal reflux disease)    H. pylori infection    history of   Hepatitis C    Patient has failed therapy in the past and has never been treated for hepatitis C because of particular mutant  that he is suffering from.   Hypertension    Shortness of breath    Stroke (HCC) 01/27/2011   Past Surgical History:  Procedure Laterality Date   Closed reduction, percutaneous pinning of right 5th finger,     COLONOSCOPY  10/09   by [place]: showed sigmoid Diverticulosis   INGUINAL HERNIA REPAIR Left 03/24/2018   Procedure: LAPAROSCOPIC LEFT INGUINAL HERNIA WITH MESH;  Surgeon: Rubin Calamity, MD;  Location: [facility] OR;  Service: General;  Laterality: Left;   INSERTION OF MESH Left 03/24/2018   Procedure: Insertion Of Mesh;  Surgeon: Rubin Calamity, MD;  Location: [facility] OR;  Service: General;  Laterality: Left;   left knee surgery     ROTATOR CUFF REPAIR     Social History:  reports that he has never smoked. He has never used smokeless tobacco. He reports current drug use. Drug: Oxycodone . He reports that he does not drink alcohol.  Allergies  Allergen Reactions   Penicillins Other (See Comments)    Did it involve swelling of the face/tongue/throat, SOB, or low BP? Yes Did it involve sudden or severe rash/hives, skin peeling, or any reaction on the inside of  your mouth or nose? Yes Did you need to seek medical attention at a hospital or doctor's office? Yes When did it last happen?      childhood If all above answers are "NO", may proceed with cephalosporin use.    Family History  Problem Relation Age of Onset   Hypertension Mother    Hypertension Father    Heart attack Father 10   Stroke Father        late 42s   Diabetes Brother     Prior to Admission medications   Medication Sig Start Date End Date Taking? Authorizing Provider   aspirin  EC 81 MG tablet Take by mouth.   Yes [provider]  atenolol  (TENORMIN ) 100 MG tablet Take 100 mg by mouth daily. 07/09/21  Yes [provider]  Buprenorphine  HCl-Naloxone  HCl 8-2 MG FILM Place under the tongue 3 (three) times daily. 08/08/21  Yes [provider]  cetirizine  (ZYRTEC ) 10 MG tablet Take by mouth. 07/17/20  Yes [provider]  cloNIDine  (CATAPRES ) 0.2 MG tablet Take 1 tablet (0.2 mg total) by mouth 2 (two) times daily. 12/10/22  Yes Rosalynn Camie CROME, MD  esomeprazole  (NEXIUM ) 40 MG capsule TAKE ONE CAPSULE BY MOUTH ONCE DAILY. 09/02/22  Yes Rosalynn Camie CROME, MD  furosemide  (LASIX ) 20 MG tablet Take 4 tablets (80 mg total) by mouth daily. 10/12/22  Yes Rosalynn Camie CROME, MD  lisinopril  (ZESTRIL ) 20 MG tablet TAKE ONE TABLET BY MOUTH ONCE DAILY FOR BLOOD PRESSURE 02/23/23  Yes Rosalynn Camie CROME, MD  memantine  (NAMENDA ) 10 MG tablet Take 1 tablet twice a day 02/09/23  Yes Wertman, Sara E, PA-C  Multiple Vitamin (MULTIVITAMIN WITH MINERALS) TABS tablet Take 1 tablet by mouth daily.   Yes [provider]  pravastatin  (PRAVACHOL ) 20 MG tablet TAKE ONE TABLET BY MOUTH ONCE DAILY FOR CHOLESTEROL 09/01/22  Yes Rosalynn Camie CROME, MD  tamsulosin  (FLOMAX ) 0.4 MG CAPS capsule TAKE ONE CAPSULE BY MOUTH ONCE DAILY 02/23/23  Yes Rosalynn Camie CROME, MD  ammonium lactate  (AMLACTIN) 12 % lotion Apply 1 Application topically as needed for dry skin. 02/12/22   Tobie Franky SQUIBB, DPM  fluticasone  (FLONASE ) 50 MCG/ACT nasal spray Place 1 spray into both nostrils daily. 12/19/22   Rosalynn Camie CROME, MD    Physical Exam: Vitals:   04/03/23 0730 04/03/23 0745 04/03/23 0947 04/03/23 1032  BP: (!) 184/92 (!) 185/92  (!) 151/75  Pulse:    (!) 57  Resp: 16 11  18   Temp:    98.1 F (36.7 C)  TempSrc:    Oral  SpO2:    100%  Weight:   91.9 kg   Height:   6' (1.829 m)    Physical Exam Vitals and nursing note reviewed.  Constitutional:      General: He is awake. He is not in acute  distress.    Appearance: Normal appearance. He is ill-appearing.  HENT:     Head: Normocephalic.     Nose: No rhinorrhea.     Mouth/Throat:     Mouth: Mucous membranes are moist.  Eyes:     General: No scleral icterus.    Pupils: Pupils are equal, round, and reactive to light.  Neck:     Vascular: No JVD.  Cardiovascular:     Rate and Rhythm: Normal rate and regular rhythm.     Heart sounds: S1 normal and S2 normal.  Pulmonary:     Effort: Pulmonary effort is normal.  Breath sounds: Examination of the right-lower field reveals decreased breath sounds. Examination of the left-lower field reveals decreased breath sounds. Decreased breath sounds present. No wheezing, rhonchi or rales.  Abdominal:     General: Bowel sounds are normal. There is no distension.     Palpations: Abdomen is soft.     Tenderness: There is no abdominal tenderness.  Musculoskeletal:     Cervical back: Neck supple.     Right lower leg: 1+ Edema present.     Left lower leg: 1+ Edema present.  Skin:    General: Skin is warm and dry.  Neurological:     General: No focal deficit present.     Mental Status: He is alert. He is disoriented.  Psychiatric:        Mood and Affect: Mood normal.        Behavior: Behavior normal. Behavior is cooperative.     Data Reviewed:  Results are pending, will review when available. Okay  - Procedure narrative: Transthoracic echocardiography. Image    quality was adequate. The study was technically difficult,    as a result of poor sound wave transmission.  - Left ventricle: The cavity size was normal. There was    moderate concentric hypertrophy. Systolic function was    normal. The estimated ejection fraction was in the range    of 55% to 60%. Wall motion was normal; there were no    regional wall motion abnormalities. Doppler parameters are    consistent with abnormal left ventricular relaxation    (grade 1 diastolic dysfunction). The E/e' ratio is <10,    suggesting normal LV filling pressure.  - Left atrium: The atrium was at the upper limits of normal    in size.  - Inferior vena cava: The vessel was normal in size; the    respirophasic diameter changes were in the normal range (=    50%); findings are consistent with normal central venous    pressure.   EKG: Vent. rate 57 BPM PR interval 232 ms QRS duration 83 ms QT/QTcB 421/410 ms P-R-T axes 17 -42 50 Sinus rhythm Prolonged PR interval Left axis deviation  Assessment and Plan: Principal Problem:   Acute encephalopathy Observation/PCU. Frequent neurochecks. Consult PT and OT. Check carotid Doppler. Check echocardiogram. Check MRI of brain.  Active Problems:   Essential hypertension Continue clonidine  0.2 mg p.o. twice daily. Continue lisinopril  20 mg p.o. daily. Continue atenolol  100 mg p.o. daily.   Continue furosemide  80 mg p.o. daily.    Grade I diastolic dysfunction No signs of decompensation. Continue ACE inhibitor, beta-blocker and diuretic. Check transthoracic echocardiogram.    Chronic kidney disease (CKD), stage IV (severe) (HCC) Monitor renal function/electrolytes.    GERD Continue home PPI or formulary equivalent.    Dyslipidemia Continue pravastatin  20 mg p.o. daily.    Chronic low back pain   Chronic pain syndrome on Suboxone  Continue Suboxone  8-2 mg p.o. 3 times daily as needed    DAT (dementia of Alzheimer type) (HCC) Continue memantine  10 mg p.o. twice daily. Supportive care.    Normocytic anemia Monitor hematocrit and hemoglobin.    Advance Care Planning:   Code Status: Full Code   Consults:   Family Communication:   Severity of Illness: The appropriate patient status for this patient is OBSERVATION. Observation status is judged to be reasonable and necessary in order to provide the required intensity of service to ensure the patient's safety. The patient's presenting symptoms, physical exam findings, and initial  radiographic and  laboratory data in the context of their medical condition is felt to place them at decreased risk for further clinical deterioration. Furthermore, it is anticipated that the patient will be medically stable for discharge from the hospital within 2 midnights of admission.   Author: Alm Dorn Castor, MD 04/03/2023 10:36 AM  For on call review [email or web address].   This document was prepared using Dragon voice recognition software and may contain some unintended transcription errors.

## 2023-04-04 ENCOUNTER — Observation Stay (HOSPITAL_BASED_OUTPATIENT_CLINIC_OR_DEPARTMENT_OTHER): Payer: 59

## 2023-04-04 DIAGNOSIS — G9340 Encephalopathy, unspecified: Secondary | ICD-10-CM | POA: Diagnosis not present

## 2023-04-04 DIAGNOSIS — G459 Transient cerebral ischemic attack, unspecified: Secondary | ICD-10-CM | POA: Diagnosis not present

## 2023-04-04 DIAGNOSIS — I5032 Chronic diastolic (congestive) heart failure: Secondary | ICD-10-CM

## 2023-04-04 LAB — COMPREHENSIVE METABOLIC PANEL
ALT: 9 U/L (ref 0–44)
AST: 13 U/L — ABNORMAL LOW (ref 15–41)
Albumin: 3.1 g/dL — ABNORMAL LOW (ref 3.5–5.0)
Alkaline Phosphatase: 62 U/L (ref 38–126)
Anion gap: 9 (ref 5–15)
BUN: 39 mg/dL — ABNORMAL HIGH (ref 8–23)
CO2: 25 mmol/L (ref 22–32)
Calcium: 8.3 mg/dL — ABNORMAL LOW (ref 8.9–10.3)
Chloride: 106 mmol/L (ref 98–111)
Creatinine, Ser: 2.81 mg/dL — ABNORMAL HIGH (ref 0.61–1.24)
GFR, Estimated: 23 mL/min — ABNORMAL LOW (ref >60)
Glucose, Bld: 98 mg/dL (ref 70–99)
Potassium: 4.1 mmol/L (ref 3.5–5.1)
Sodium: 140 mmol/L (ref 135–145)
Total Bilirubin: 0.4 mg/dL (ref 0.0–1.2)
Total Protein: 6 g/dL — ABNORMAL LOW (ref 6.5–8.1)

## 2023-04-04 LAB — ECHOCARDIOGRAM COMPLETE
Area-P 1/2: 3.03 cm2
Calc EF: 68.8 %
Height: 72 in
S' Lateral: 2.8 cm
Single Plane A2C EF: 73.7 %
Single Plane A4C EF: 63.7 %
Weight: 3243.2 [oz_av]

## 2023-04-04 LAB — CBC
HCT: 33.9 % — ABNORMAL LOW (ref 39.0–52.0)
Hemoglobin: 10.8 g/dL — ABNORMAL LOW (ref 13.0–17.0)
MCH: 29.2 pg (ref 26.0–34.0)
MCHC: 31.9 g/dL (ref 30.0–36.0)
MCV: 91.6 fL (ref 80.0–100.0)
Platelets: 210 10*3/uL (ref 150–400)
RBC: 3.7 MIL/uL — ABNORMAL LOW (ref 4.22–5.81)
RDW: 13.7 % (ref 11.5–15.5)
WBC: 5.3 10*3/uL (ref 4.0–10.5)
nRBC: 0 % (ref 0.0–0.2)

## 2023-04-04 NOTE — Evaluation (Signed)
 Physical Therapy Brief Evaluation and Discharge Note Patient Details Name: Edward Morgan MRN: [id number] DOB: 1950-06-27 Today's Date: 04/04/2023   History of Present Illness  73 yo male admitted with acute encephalopathy. Hx of Hep C, CVA, Alz  Clinical Impression  On eval, pt was Mod Ind with mobility. He walked ~300 feet around the unit. No acute PT needs. 1x eval. Can ambulate with nursing and/or mobility team.       PT Assessment    Assistance Needed at Discharge       Equipment Recommendations None recommended by PT  Recommendations for Other Services       Precautions/Restrictions Precautions Precautions: None Restrictions Weight Bearing Restrictions Per Provider Order: No        Mobility  Bed Mobility          Transfers Overall transfer level: Modified independent                      Ambulation/Gait Ambulation/Gait assistance: Modified independent (Device/Increase time) Gait Distance (Feet): 300 Feet Assistive device: None Gait Pattern/deviations: Step-through pattern   General Gait Details: No overt LOB. Pt denied dizziness. Tolerated distance well  Home Activity Instructions    Stairs            Modified Rankin (Stroke Patients Only)        Balance                          Pertinent Vitals/Pain   Pain Assessment Pain Assessment: No/denies pain     Home Living   Living Arrangements: Spouse/significant other       Home Equipment:  (walking stick)        Prior Function        UE/LE Assessment               Communication   Communication Communication: No apparent difficulties     Cognition         General Comments      Exercises     Assessment/Plan    PT Problem List         PT Visit Diagnosis      No Skilled PT Patient is modified independent with all activity/mobility   Co-evaluation                AMPAC 6 Clicks Help needed turning from your back to your side  while in a flat bed without using bedrails?: None Help needed moving from lying on your back to sitting on the side of a flat bed without using bedrails?: None Help needed moving to and from a bed to a chair (including a wheelchair)?: None Help needed standing up from a chair using your arms (e.g., wheelchair or bedside chair)?: None Help needed to walk in hospital room?: None Help needed climbing 3-5 steps with a railing? : A Little 6 Click Score: 23      End of Session Equipment Utilized During Treatment: Gait belt Activity Tolerance: Patient tolerated treatment well Patient left: in bed;with call bell/phone within reach;with bed alarm set         Time: 1210-1220 PT Time Calculation (min) (ACUTE ONLY): 10 min  Charges:   PT Evaluation $PT Eval Low Complexity: 1 Low          Dannial SQUIBB, PT Acute Rehabilitation  Office: [phone]

## 2023-04-04 NOTE — Hospital Course (Addendum)
 73 y.o. male with medical history significant of osteoarthritis, depression, GERD, H. pylori infection, treated hep C, hypertension, hyperlipidemia, history of CVA, Alzheimer's dementia who was brought to the ED due to altered mental status. In ED he is oriented to hospital, current president, but had some trouble saying the year, trying multiple times without being able to fully verbalize.He is able to answer simple questions-denied dyspnea sore throat chest pain arrhythmia.  Vitals afebrile not hypoxic, labs showed LJ:rnonmozdd with trace hemoglobin and protein of 100 mg/deciliter, but otherwise unremarkable.  Coronavirus, influenza and RSV PCR was negative.  CBC showed a white count of 5.3, hemoglobin 11.1 g/dL and platelets 753.  Unremarkable ammonia, acetaminophen , alcohol and salicylate level. CMP showed a BUN of 40, creatinine 3.23, recent bun/creat 2023: 2.9-3.1 indicating CKD stage IV and corrected calcium of 8.8 mg/dL, the rest of the electrolytes and hepatic functions were normal. Cxr>normal heart size and pulmonary vascularity. CT head with no acute intracranial pathology,moderate age-related atrophy,chronic microvascular ischemic changes. Patient admitted for further management MRI brain with and without-no acute finding.  Per wife patient was more confused at home without focal weakness, was repeatedly removing his clothes from the closet, no SPT she is swallowing history of facial weakness. Presentation likely due to his dementia with progression, advised to follow-up with neurology. This morning he is back to baseline after PT OT evaluation plan for discharge home BUT He refused PTOT. TOC to see if abel to arrange ptot hh. Work up normal and is being discharged home.

## 2023-04-04 NOTE — Progress Notes (Signed)
  Echocardiogram 2D Echocardiogram has been performed.  Royden Corin 04/04/2023, 8:33 AM

## 2023-04-04 NOTE — Discharge Summary (Signed)
 Physician Discharge Summary  BREAKER SPRINGER FMW:[id number] DOB: 22-Sep-1950 DOA: 04/02/2023  PCP: Rosalynn Camie LITTIE, MD  Admit date: 04/02/2023 Discharge date: 04/04/2023 Recommendations for Outpatient Follow-up:  Follow up with PCP in 1 weeks-call for appointment Please obtain BMP/CBC in one week  Discharge Dispo: Home Discharge Condition: Stable Code Status:   Code Status: Full Code Diet recommendation:  Diet Order             Diet regular Fluid consistency: Thin  Diet effective now                    Brief/Interim Summary: 73 y.o. male with medical history significant of osteoarthritis, depression, GERD, H. pylori infection, treated hep C, hypertension, hyperlipidemia, history of CVA, Alzheimer's dementia who was brought to the ED due to altered mental status. In ED he is oriented to hospital, current president, but had some trouble saying the year, trying multiple times without being able to fully verbalize.He is able to answer simple questions-denied dyspnea sore throat chest pain arrhythmia.  Vitals afebrile not hypoxic, labs showed LJ:rnonmozdd with trace hemoglobin and protein of 100 mg/deciliter, but otherwise unremarkable.  Coronavirus, influenza and RSV PCR was negative.  CBC showed a white count of 5.3, hemoglobin 11.1 g/dL and platelets 753.  Unremarkable ammonia, acetaminophen , alcohol and salicylate level. CMP showed a BUN of 40, creatinine 3.23, recent bun/creat 2023: 2.9-3.1 indicating CKD stage IV and corrected calcium of 8.8 mg/dL, the rest of the electrolytes and hepatic functions were normal. Cxr>normal heart size and pulmonary vascularity. CT head with no acute intracranial pathology,moderate age-related atrophy,chronic microvascular ischemic changes. Patient admitted for further management MRI brain with and without-no acute finding.  Per wife patient was more confused at home without focal weakness, was repeatedly removing his clothes from the closet, no SPT she is  swallowing history of facial weakness. Presentation likely due to his dementia with progression, advised to follow-up with neurology. This morning he is back to baseline after PT OT evaluation plan for discharge home BUT He refused PTOT. TOC to see if abel to arrange ptot hh. Work up normal and is being discharged home.    Discharge Diagnoses:  Principal Problem:   Acute encephalopathy Active Problems:   Essential hypertension   Chronic kidney disease (CKD), stage IV (severe) (HCC)   GERD   Dyslipidemia   Chronic low back pain   Chronic pain syndrome on Suboxone    DAT (dementia of Alzheimer type) (HCC)   Normocytic anemia   Grade I diastolic dysfunction  Acute encephalopathy 2/2 dementia Background history of dementia: Unclear etiology workup unremarkable for any infection-chest x-ray reviewed with COVID flu negative, MRI no acute finding including CT head.  Carotid duplex-180% stenosis bilaterally and TTE ordered for completeness and normal w/ lvef 60-65%, no rwma, TSH B12 b1 and ammonia were normal recently.Given Background history of dementia question AMS due to that.  Seen by lobar neurology 02/09/23 for memory loss, noted to be able to provide symptom PRN well without difficulty although not active-has difficulty remembering recent conversations.  Getting PT OT evaluation which he did well at this time okay for discharge home.  He will follow-up with his neurology soon  Essential hypertension: Borderline controlled.  Continue home clonidine , lisinopril  continued on Lasix   Chronic diastolic dysfunction G1 DD: No signs of decompensation.  Continue diuretics .echocardiogram stable.    CKD stage IV: recent bun/creat 2023: 2.9-3.1.  Creatinine stable slightly better than baseline.  Monitor  GERD: Continue PPI  HLD: Continue statin  Chronic low back pain Chronic pain syndrome: on Suboxone , no home dose  Normocytic anemia: Hemoglobin  stable.  Consults: none Subjective: Follow-up awake oriented to self people place and pleasantly confused stable  Discharge Exam: Vitals:   04/04/23 0435 04/04/23 1309  BP: (!) 174/68   Pulse: (!) 57 69  Resp: 17 18  Temp: 98.6 F (37 C) 98.1 F (36.7 C)  SpO2: 100% 100%   General: Pt is alert, awake, not in acute distress Cardiovascular: RRR, S1/S2 +, no rubs, no gallops Respiratory: CTA bilaterally, no wheezing, no rhonchi Abdominal: Soft, NT, ND, bowel sounds + Extremities: no edema, no cyanosis  Discharge Instructions  Discharge Instructions     Discharge instructions   Complete by: As directed    Follow-up with neurology regarding dementia progression and management of medication  Please call call MD or return to ER for similar or worsening recurring problem that brought you to hospital or if any fever,nausea/vomiting,abdominal pain, uncontrolled pain, chest pain,  shortness of breath or any other alarming symptoms.  Please follow-up your doctor as instructed in a week time and call the office for appointment.  Please avoid alcohol, smoking, or any other illicit substance and maintain healthy habits including taking your regular medications as prescribed.  You were cared for by a hospitalist during your hospital stay. If you have any questions about your discharge medications or the care you received while you were in the hospital after you are discharged, you can call the unit and ask to speak with the hospitalist on call if the hospitalist that took care of you is not available.  Once you are discharged, your primary care physician will handle any further medical issues. Please note that NO REFILLS for any discharge medications will be authorized once you are discharged, as it is imperative that you return to your primary care physician (or establish a relationship with a primary care physician if you do not have one) for your aftercare needs so that they can reassess  your need for medications and monitor your lab values   Increase activity slowly   Complete by: As directed       Allergies as of 04/04/2023       Reactions   Penicillins Other (See Comments)   Did it involve swelling of the face/tongue/throat, SOB, or low BP? Yes Did it involve sudden or severe rash/hives, skin peeling, or any reaction on the inside of your mouth or nose? Yes Did you need to seek medical attention at a hospital or doctor's office? Yes When did it last happen?      childhood If all above answers are "NO", may proceed with cephalosporin use.        Medication List     TAKE these medications    aspirin  EC 81 MG tablet Take by mouth.   atenolol  100 MG tablet Commonly known as: TENORMIN  Take 100 mg by mouth daily.   Buprenorphine  HCl-Naloxone  HCl 8-2 MG Film Place under the tongue 3 (three) times daily.   Buprenorphine  HCl-Naloxone  HCl 4-1 MG Film Place 1 Film under the tongue 2 (two) times daily.   cetirizine  10 MG tablet Commonly known as: ZYRTEC  Take by mouth.   cloNIDine  0.2 MG tablet Commonly known as: CATAPRES  Take 1 tablet (0.2 mg total) by mouth 2 (two) times daily.   doxazosin 1 MG tablet Commonly known as: CARDURA Take 1 mg by mouth at bedtime.   esomeprazole  40 MG capsule Commonly known  as: NEXIUM  TAKE ONE CAPSULE BY MOUTH ONCE DAILY.   fluticasone  50 MCG/ACT nasal spray Commonly known as: FLONASE  Place 1 spray into both nostrils daily.   furosemide  20 MG tablet Commonly known as: LASIX  Take 4 tablets (80 mg total) by mouth daily.   furosemide  40 MG tablet Commonly known as: LASIX  Take 40 mg by mouth daily.   lisinopril  20 MG tablet Commonly known as: ZESTRIL  TAKE ONE TABLET BY MOUTH ONCE DAILY FOR BLOOD PRESSURE What changed: See the new instructions.   memantine  10 MG tablet Commonly known as: NAMENDA  Take 1 tablet twice a day What changed:  how much to take how to take this when to take this   multivitamin with  minerals Tabs tablet Take 1 tablet by mouth daily.   pravastatin  20 MG tablet Commonly known as: PRAVACHOL  TAKE ONE TABLET BY MOUTH ONCE DAILY FOR CHOLESTEROL What changed: See the new instructions.   tamsulosin  0.4 MG Caps capsule Commonly known as: FLOMAX  TAKE ONE CAPSULE BY MOUTH ONCE DAILY   traZODone 50 MG tablet Commonly known as: DESYREL Take 50 mg by mouth at bedtime.        Follow-up Information     Rosalynn Camie CROME, MD Follow up in 1 week(s).   Specialties: Family Medicine, Sports Medicine Contact information: [address] [place] [address] [phone]                Allergies  Allergen Reactions   Penicillins Other (See Comments)    Did it involve swelling of the face/tongue/throat, SOB, or low BP? Yes Did it involve sudden or severe rash/hives, skin peeling, or any reaction on the inside of your mouth or nose? Yes Did you need to seek medical attention at a hospital or doctor's office? Yes When did it last happen?      childhood If all above answers are "NO", may proceed with cephalosporin use.    The results of significant diagnostics from this hospitalization (including imaging, microbiology, ancillary and laboratory) are listed below for reference.    Microbiology: Recent Results (from the past 240 hours)  Resp panel by RT-PCR (RSV, Flu A&B, Covid) Anterior Nasal Swab     Status: None   Collection Time: 04/02/23  5:47 PM   Specimen: Anterior Nasal Swab  Result Value Ref Range Status   SARS Coronavirus 2 by RT PCR NEGATIVE NEGATIVE Final    Comment: (NOTE) SARS-CoV-2 target nucleic acids are NOT DETECTED.  The SARS-CoV-2 RNA is generally detectable in upper respiratory specimens during the acute phase of infection. The lowest concentration of SARS-CoV-2 viral copies this assay can detect is 138 copies/mL. A negative result does not preclude SARS-Cov-2 infection and should not be used as the sole basis for treatment or other  patient management decisions. A negative result may occur with  improper specimen collection/handling, submission of specimen other than nasopharyngeal swab, presence of viral mutation(s) within the areas targeted by this assay, and inadequate number of viral copies(<138 copies/mL). A negative result must be combined with clinical observations, patient history, and epidemiological information. The expected result is Negative.  Fact Sheet for Patients:  bloggercourse.com  Fact Sheet for Healthcare Providers:  seriousbroker.it  This test is no t yet approved or cleared by the [place]  FDA and  has been authorized for detection and/or diagnosis of SARS-CoV-2 by FDA under an Emergency Use Authorization (EUA). This EUA will remain  in effect (meaning this test can be used) for the duration of the  COVID-19 declaration under Section 564(b)(1) of the Act, 21 U.S.C.section 360bbb-3(b)(1), unless the authorization is terminated  or revoked sooner.       Influenza A by PCR NEGATIVE NEGATIVE Final   Influenza B by PCR NEGATIVE NEGATIVE Final    Comment: (NOTE) The Xpert Xpress SARS-CoV-2/FLU/RSV plus assay is intended as an aid in the diagnosis of influenza from Nasopharyngeal swab specimens and should not be used as a sole basis for treatment. Nasal washings and aspirates are unacceptable for Xpert Xpress SARS-CoV-2/FLU/RSV testing.  Fact Sheet for Patients: bloggercourse.com  Fact Sheet for Healthcare Providers: seriousbroker.it  This test is not yet approved or cleared by the [place]  FDA and has been authorized for detection and/or diagnosis of SARS-CoV-2 by FDA under an Emergency Use Authorization (EUA). This EUA will remain in effect (meaning this test can be used) for the duration of the COVID-19 declaration under Section 564(b)(1) of the Act, 21 U.S.C. section  360bbb-3(b)(1), unless the authorization is terminated or revoked.     Resp Syncytial Virus by PCR NEGATIVE NEGATIVE Final    Comment: (NOTE) Fact Sheet for Patients: bloggercourse.com  Fact Sheet for Healthcare Providers: seriousbroker.it  This test is not yet approved or cleared by the [place]  FDA and has been authorized for detection and/or diagnosis of SARS-CoV-2 by FDA under an Emergency Use Authorization (EUA). This EUA will remain in effect (meaning this test can be used) for the duration of the COVID-19 declaration under Section 564(b)(1) of the Act, 21 U.S.C. section 360bbb-3(b)(1), unless the authorization is terminated or revoked.  Performed at [facility], [address], [place], [place] [address]     Procedures/Studies: ECHOCARDIOGRAM COMPLETE Result Date: 04/04/2023    ECHOCARDIOGRAM REPORT   Patient Name:   KAIYON HYNES Pribble Date of Exam: 04/04/2023 Medical Rec #:  [id number]       Height:       72.0 in Accession #:    [id number]      Weight:       202.7 lb Date of Birth:  07-23-1950       BSA:          2.143 m Patient Age:    72 years        BP:           174/68 mmHg Patient Gender: M               HR:           89 bpm. Exam Location:  Inpatient Procedure: 2D Echo, Cardiac Doppler and Color Doppler Indications:    I50.40* Unspecified combined systolic (congestive) and diastolic                 (congestive) heart failure  History:        Patient has prior history of Echocardiogram examinations, most                 recent 03/25/2011. Stroke, Signs/Symptoms:Alzheimer's; Risk                 Factors:Hypertension and Dyslipidemia.  Sonographer:    Ellouise Mose RDCS Referring Phys: [id number] DAVID MANUEL ORTIZ IMPRESSIONS  1. Left ventricular ejection fraction, by estimation, is 60 to 65%. The left ventricle has normal function. The left ventricle has no regional wall motion abnormalities. Left ventricular diastolic  parameters are consistent with Grade I diastolic dysfunction (impaired relaxation).  2. Right ventricular systolic function is normal. The right ventricular size is normal. Tricuspid regurgitation  signal is inadequate for assessing PA pressure.  3. The mitral valve is normal in structure. No evidence of mitral valve regurgitation. No evidence of mitral stenosis.  4. The aortic valve was not well visualized. Aortic valve regurgitation is not visualized. No aortic stenosis is present.  5. The inferior vena cava is normal in size with greater than 50% respiratory variability, suggesting right atrial pressure of 3 mmHg. Comparison(s): No prior Echocardiogram. FINDINGS  Left Ventricle: Left ventricular ejection fraction, by estimation, is 60 to 65%. The left ventricle has normal function. The left ventricle has no regional wall motion abnormalities. The left ventricular internal cavity size was normal in size. There is  no left ventricular hypertrophy. Left ventricular diastolic parameters are consistent with Grade I diastolic dysfunction (impaired relaxation). Normal left ventricular filling pressure. Right Ventricle: The right ventricular size is normal. No increase in right ventricular wall thickness. Right ventricular systolic function is normal. Tricuspid regurgitation signal is inadequate for assessing PA pressure. Left Atrium: Left atrial size was normal in size. Right Atrium: Right atrial size was normal in size. Pericardium: There is no evidence of pericardial effusion. Mitral Valve: The mitral valve is normal in structure. No evidence of mitral valve regurgitation. No evidence of mitral valve stenosis. Tricuspid Valve: The tricuspid valve is normal in structure. Tricuspid valve regurgitation is not demonstrated. No evidence of tricuspid stenosis. Aortic Valve: The aortic valve was not well visualized. Aortic valve regurgitation is not visualized. No aortic stenosis is present. Pulmonic Valve: The pulmonic valve  was not well visualized. Pulmonic valve regurgitation is not visualized. No evidence of pulmonic stenosis. Aorta: The aortic root and ascending aorta are structurally normal, with no evidence of dilitation. Venous: The inferior vena cava is normal in size with greater than 50% respiratory variability, suggesting right atrial pressure of 3 mmHg. IAS/Shunts: No atrial level shunt detected by color flow Doppler.  LEFT VENTRICLE PLAX 2D LVIDd:         4.70 cm     Diastology LVIDs:         2.80 cm     LV e' medial:    8.05 cm/s LV PW:         1.00 cm     LV E/e' medial:  8.5 LV IVS:        0.90 cm     LV e' lateral:   7.72 cm/s LVOT diam:     2.40 cm     LV E/e' lateral: 8.8 LV SV:         116 LV SV Index:   54 LVOT Area:     4.52 cm  LV Volumes (MOD) LV vol d, MOD A2C: 73.8 ml LV vol d, MOD A4C: 79.7 ml LV vol s, MOD A2C: 19.4 ml LV vol s, MOD A4C: 28.9 ml LV SV MOD A2C:     54.4 ml LV SV MOD A4C:     79.7 ml LV SV MOD BP:      53.3 ml RIGHT VENTRICLE             IVC RV S prime:     12.20 cm/s  IVC diam: 1.65 cm TAPSE (M-mode): 3.0 cm LEFT ATRIUM           Index        RIGHT ATRIUM           Index LA diam:      2.80 cm 1.31 cm/m   RA Area:     10.50  cm LA Vol (A2C): 44.0 ml 20.53 ml/m  RA Volume:   18.70 ml  8.73 ml/m LA Vol (A4C): 28.1 ml 13.11 ml/m  AORTIC VALVE LVOT Vmax:   125.00 cm/s LVOT Vmean:  82.000 cm/s LVOT VTI:    0.256 m  AORTA Ao Root diam: 3.20 cm Ao Asc diam:  3.10 cm MITRAL VALVE MV Area (PHT): 3.03 cm    SHUNTS MV Decel Time: 250 msec    Systemic VTI:  0.26 m MV E velocity: 68.30 cm/s  Systemic Diam: 2.40 cm MV A velocity: 73.70 cm/s MV E/A ratio:  0.93 Vishnu Priya Mallipeddi Electronically signed by Diannah Late Mallipeddi Signature Date/Time: 04/04/2023/1:49:21 PM    Final    VAS US  CAROTID Result Date: 04/04/2023 Carotid Arterial Duplex Study Patient Name:  CLAIBORNE STROBLE  Date of Exam:   04/04/2023 Medical Rec #: [id number]        Accession #:    [id number] Date of Birth: 01-15-1951         Patient Gender: M Patient Age:   50 years Exam Location:  [facility] Procedure:      VAS US  CAROTID Referring Phys: ALM ORTIZ --------------------------------------------------------------------------------  Indications:       TIA. Risk Factors:      Hypertension, hyperlipidemia, prior CVA. Comparison Study:  None. Performing Technologist: Garnette Rockers  Examination Guidelines: A complete evaluation includes B-mode imaging, spectral Doppler, color Doppler, and power Doppler as needed of all accessible portions of each vessel. Bilateral testing is considered an integral part of a complete examination. Limited examinations for reoccurring indications may be performed as noted.  Right Carotid Findings: +----------+--------+--------+--------+------------------+------------------+           PSV cm/sEDV cm/sStenosisPlaque DescriptionComments           +----------+--------+--------+--------+------------------+------------------+ CCA Prox  125     11                                                   +----------+--------+--------+--------+------------------+------------------+ CCA Distal98      11                                intimal thickening +----------+--------+--------+--------+------------------+------------------+ ICA Prox  74      11      1-39%   heterogenous                         +----------+--------+--------+--------+------------------+------------------+ ICA Distal64      13                                                   +----------+--------+--------+--------+------------------+------------------+ ECA       115     9                                                    +----------+--------+--------+--------+------------------+------------------+ +----------+--------+-------+--------+-------------------+           PSV cm/sEDV cmsDescribeArm Pressure (mmHG) +----------+--------+-------+--------+-------------------+ [id number]  0                                   +----------+--------+-------+--------+-------------------+ +---------+--------+--+--------+-+ VertebralPSV cm/s49EDV cm/s8 +---------+--------+--+--------+-+  Left Carotid Findings: +----------+--------+--------+--------+------------------+------------------+           PSV cm/sEDV cm/sStenosisPlaque DescriptionComments           +----------+--------+--------+--------+------------------+------------------+ CCA Prox  162     14                                                   +----------+--------+--------+--------+------------------+------------------+ CCA Distal93      11                                intimal thickening +----------+--------+--------+--------+------------------+------------------+ ICA Prox  76      13      1-39%   hypoechoic                           +----------+--------+--------+--------+------------------+------------------+ ICA Distal82      18                                                   +----------+--------+--------+--------+------------------+------------------+ ECA       122     0                                                    +----------+--------+--------+--------+------------------+------------------+ +----------+--------+--------+--------+-------------------+           PSV cm/sEDV cm/sDescribeArm Pressure (mmHG) +----------+--------+--------+--------+-------------------+ [id number]     0                                   +----------+--------+--------+--------+-------------------+ +---------+--------+--+--------+--+ VertebralPSV cm/s55EDV cm/s10 +---------+--------+--+--------+--+   Summary: Right Carotid: Velocities in the right ICA are consistent with a 1-39% stenosis. Left Carotid: Velocities in the left ICA are consistent with a 1-39% stenosis. Vertebrals:  Bilateral vertebral arteries demonstrate antegrade flow. Subclavians: Normal flow hemodynamics were seen in bilateral subclavian               arteries. *See table(s) above for measurements and observations.     Preliminary    MR BRAIN WO CONTRAST Result Date: 04/03/2023 CLINICAL DATA:  Altered mental status EXAM: MRI HEAD WITHOUT CONTRAST TECHNIQUE: Multiplanar, multiecho pulse sequences of the brain and surrounding structures were obtained without intravenous contrast. COMPARISON:  10/24/2021 MRI head, correlation is also made with 04/02/2023 CT head FINDINGS: Brain: No restricted diffusion to suggest acute or subacute infarct. No acute hemorrhage, mass, mass effect, or midline shift. No hydrocephalus or extra-axial collection. Scattered foci of hemosiderin deposition in the right occipital lobe, right temporal lobe, and left basal ganglia, with additional hemosiderin deposition associated with a remote left lentiform nucleus infarct. Additional remote lacunar infarcts in the right pons and bilateral thalami. T2 hyperintense signal in the periventricular  white matter, likely the sequela of mild chronic small vessel ischemic disease. Vascular: Normal arterial flow voids. Skull and upper cervical spine: Normal marrow signal. Sinuses/Orbits: Clear paranasal sinuses. No acute finding in the orbits. Other: Trace fluid in the right-greater-than-left mastoid air cells. IMPRESSION: No acute intracranial process. No evidence of acute or subacute infarct. Electronically Signed   By: Donald Campion M.D.   On: 04/03/2023 17:01   DG Chest Port 1 View Result Date: 04/02/2023 CLINICAL DATA:  Altered mental status.  Chest pain. EXAM: PORTABLE CHEST 1 VIEW COMPARISON:  09/02/2021 FINDINGS: Slightly shallow inspiration. Heart size and pulmonary vascularity are normal. Lungs are clear. No pleural effusion or pneumothorax. Mediastinal contours appear intact. Degenerative changes in the spine and shoulders. Postoperative change in the left shoulder. IMPRESSION: No active disease. Electronically Signed   By: Elsie Gravely M.D.   On: 04/02/2023 18:42   CT HEAD  WO CONTRAST ( ) Result Date: 04/02/2023 CLINICAL DATA:  Altered mental status. EXAM: CT HEAD WITHOUT CONTRAST TECHNIQUE: Contiguous axial images were obtained from the base of the skull through the vertex without intravenous contrast. RADIATION DOSE REDUCTION: This exam was performed according to the departmental dose-optimization program which includes automated exposure control, adjustment of the mA and/or kV according to patient size and/or use of iterative reconstruction technique. COMPARISON:  Head CT dated 01/28/2011. FINDINGS: Brain: Moderate age-related atrophy and chronic microvascular ischemic changes. There is no acute intracranial hemorrhage. No mass effect or midline shift. No extra-axial fluid collection. Vascular: No hyperdense vessel or unexpected calcification. Skull: Normal. Negative for fracture or focal lesion. Sinuses/Orbits: No acute finding. Other: None IMPRESSION: 1. No acute intracranial pathology. 2. Moderate age-related atrophy and chronic microvascular ischemic changes. Electronically Signed   By: Vanetta Chou M.D.   On: 04/02/2023 18:21    Labs: BNP (last 3 results) No results for input(s): BNP in the last 8760 hours. Basic Metabolic Panel: Recent Labs  Lab 04/02/23 1747 04/03/23 1211 04/04/23 0428  NA 139 139 140  K 4.2 4.2 4.1  CL 104 106 106  CO2 29 26 25   GLUCOSE 90 97 98  BUN 40* 38* 39*  CREATININE 3.23* 2.56* 2.81*  CALCIUM 8.7* 8.6* 8.3*  MG 2.4  --   --    Liver Function Tests: Recent Labs  Lab 04/02/23 1747 04/04/23 0428  AST 16 13*  ALT 9 9  ALKPHOS 75 62  BILITOT 0.5 0.4  PROT 6.9 6.0*  ALBUMIN 3.9 3.1*   No results for input(s): LIPASE, AMYLASE in the last 168 hours. Recent Labs  Lab 04/02/23 1747  AMMONIA 21   CBC: Recent Labs  Lab 04/02/23 1836 04/03/23 1211 04/04/23 0428  WBC 5.3 4.1 5.3  NEUTROABS 3.5  --   --   HGB 11.1* 10.4* 10.8*  HCT 34.7* 33.6* 33.9*  MCV 93.3 93.3 91.6  PLT 246 224 210   Cardiac  Enzymes: No results for input(s): CKTOTAL, CKMB, CKMBINDEX, TROPONINI in the last 168 hours. BNP: Invalid input(s): POCBNP CBG: No results for input(s): GLUCAP in the last 168 hours. D-Dimer No results for input(s): DDIMER in the last 72 hours. Hgb A1c No results for input(s): HGBA1C in the last 72 hours. Lipid Profile No results for input(s): CHOL, HDL, LDLCALC, TRIG, CHOLHDL, LDLDIRECT in the last 72 hours. Thyroid  function studies No results for input(s): TSH, T4TOTAL, T3FREE, THYROIDAB in the last 72 hours.  Invalid input(s): FREET3 Anemia work up No results for input(s): VITAMINB12, FOLATE, FERRITIN, TIBC, IRON, RETICCTPCT in  the last 72 hours. Urinalysis    Component Value Date/Time   COLORURINE COLORLESS (A) 04/02/2023 2148   APPEARANCEUR CLEAR 04/02/2023 2148   LABSPEC 1.010 04/02/2023 2148   PHURINE 7.0 04/02/2023 2148   GLUCOSEU NEGATIVE 04/02/2023 2148   GLUCOSEU NEG mg/dL 88/89/7990 9999   HGBUR TRACE (A) 04/02/2023 2148   BILIRUBINUR NEGATIVE 04/02/2023 2148   KETONESUR NEGATIVE 04/02/2023 2148   PROTEINUR 100 (A) 04/02/2023 2148   UROBILINOGEN 1.0 11/04/2011 0824   NITRITE NEGATIVE 04/02/2023 2148   LEUKOCYTESUR NEGATIVE 04/02/2023 2148   Sepsis Labs Recent Labs  Lab 04/02/23 1836 04/03/23 1211 04/04/23 0428  WBC 5.3 4.1 5.3   Microbiology Recent Results (from the past 240 hours)  Resp panel by RT-PCR (RSV, Flu A&B, Covid) Anterior Nasal Swab     Status: None   Collection Time: 04/02/23  5:47 PM   Specimen: Anterior Nasal Swab  Result Value Ref Range Status   SARS Coronavirus 2 by RT PCR NEGATIVE NEGATIVE Final    Comment: (NOTE) SARS-CoV-2 target nucleic acids are NOT DETECTED.  The SARS-CoV-2 RNA is generally detectable in upper respiratory specimens during the acute phase of infection. The lowest concentration of SARS-CoV-2 viral copies this assay can detect is 138 copies/mL. A negative result  does not preclude SARS-Cov-2 infection and should not be used as the sole basis for treatment or other patient management decisions. A negative result may occur with  improper specimen collection/handling, submission of specimen other than nasopharyngeal swab, presence of viral mutation(s) within the areas targeted by this assay, and inadequate number of viral copies(<138 copies/mL). A negative result must be combined with clinical observations, patient history, and epidemiological information. The expected result is Negative.  Fact Sheet for Patients:  bloggercourse.com  Fact Sheet for Healthcare Providers:  seriousbroker.it  This test is no t yet approved or cleared by the [place]  FDA and  has been authorized for detection and/or diagnosis of SARS-CoV-2 by FDA under an Emergency Use Authorization (EUA). This EUA will remain  in effect (meaning this test can be used) for the duration of the COVID-19 declaration under Section 564(b)(1) of the Act, 21 U.S.C.section 360bbb-3(b)(1), unless the authorization is terminated  or revoked sooner.       Influenza A by PCR NEGATIVE NEGATIVE Final   Influenza B by PCR NEGATIVE NEGATIVE Final    Comment: (NOTE) The Xpert Xpress SARS-CoV-2/FLU/RSV plus assay is intended as an aid in the diagnosis of influenza from Nasopharyngeal swab specimens and should not be used as a sole basis for treatment. Nasal washings and aspirates are unacceptable for Xpert Xpress SARS-CoV-2/FLU/RSV testing.  Fact Sheet for Patients: bloggercourse.com  Fact Sheet for Healthcare Providers: seriousbroker.it  This test is not yet approved or cleared by the [place]  FDA and has been authorized for detection and/or diagnosis of SARS-CoV-2 by FDA under an Emergency Use Authorization (EUA). This EUA will remain in effect (meaning this test can be used) for  the duration of the COVID-19 declaration under Section 564(b)(1) of the Act, 21 U.S.C. section 360bbb-3(b)(1), unless the authorization is terminated or revoked.     Resp Syncytial Virus by PCR NEGATIVE NEGATIVE Final    Comment: (NOTE) Fact Sheet for Patients: bloggercourse.com  Fact Sheet for Healthcare Providers: seriousbroker.it  This test is not yet approved or cleared by the [place]  FDA and has been authorized for detection and/or diagnosis of SARS-CoV-2 by FDA under an Emergency Use Authorization (EUA). This EUA will remain in effect (meaning this  test can be used) for the duration of the COVID-19 declaration under Section 564(b)(1) of the Act, 21 U.S.C. section 360bbb-3(b)(1), unless the authorization is terminated or revoked.  Performed at [facility], [address], [place], [place] [address]      Time coordinating discharge: 25 minutes  SIGNED: Mennie LAMY, MD  [facility] 04/04/2023, 1:52 PM  If 7PM-7AM, please contact night-coverage [email or web address]

## 2023-04-04 NOTE — Progress Notes (Signed)
 D/C orders received; pt oriented x 1 to self; LVM for pt's wife Neamiah Sciarra [phone]) x 2 and pt's dtr Tomisha Allec [phone]) x 2; unable to complete TOC assessment; also unable to present MOON as pt was admitted in observation status.

## 2023-04-06 ENCOUNTER — Ambulatory Visit: Payer: 59

## 2023-04-06 ENCOUNTER — Telehealth: Payer: Self-pay

## 2023-04-06 NOTE — Transitions of Care (Post Inpatient/ED Visit) (Signed)
   04/06/2023  Name: Edward Morgan MRN: [id number] DOB: [birth year]  Today's TOC FU Call Status: Today's TOC FU Call Status:: Unsuccessful Call (1st Attempt) Unsuccessful Call (1st Attempt) Date: 04/06/23  Attempted to reach the patient regarding the most recent Inpatient/ED visit.  Follow Up Plan: Additional outreach attempts will be made to reach the patient to complete the Transitions of Care (Post Inpatient/ED visit) call.   Signature Karena Addison, LPN [facility] Nurse Health Advisor Direct Dial [phone]

## 2023-04-07 ENCOUNTER — Encounter: Payer: Self-pay | Admitting: Physician Assistant

## 2023-04-07 ENCOUNTER — Ambulatory Visit: Payer: 59 | Admitting: Physician Assistant

## 2023-04-07 ENCOUNTER — Ambulatory Visit: Payer: 59 | Admitting: Student

## 2023-04-07 NOTE — Progress Notes (Incomplete)
Assessment/Plan:   Dementia likely due to Alzheimer's disease***  Edward Morgan is a delightful  73 y.o. RH male with a history of hypertension, hyperlipidemia, CKD4 history of CVA, GERD, history of treated hep C, and a history of dementia likely due to Alzheimer's disease seen today in follow up for memory loss. Patient is currently on memantine 10 mg twice daily.  He was recently admitted to the hospital with acute confusional state, felt to be due to progressing dementia, as all of the workup negative.  He was able to answer simple questions during that time,.  Recall, in the past he declined physical therapy despite recommendations to do so due to lack of activity.   Follow up in   months. Recommend EEG for transient alteration of awareness recommend good control of her cardiovascular risk factors.  Patient informed of BP readings. Continue to control mood as per PCP Recommend increasing activity.  Once again, during the hospitalization, PT OT was recommended, but the patient declines.    Subjective:    This patient is accompanied in the office by *** who supplements the history.  Previous records as well as any outside records available were reviewed prior to todays visit. Patient was last seen on 02/09/2023, last MMSE 21/30 on 08/21/2022  Any changes in memory since last visit? ".  He has some difficulty remembering new information, recent conversations and names but able to recognize their faces.  He likes to watch sports on TV, does not like to do any brain games. Repeats oneself?  Endorsed Disoriented when walking into a room?  "Sometimes I get confused and I go to the wrong room in the house "***  Leaving objects?  "All the time, I lose everything "***  Wandering behavior?  denies   Any personality changes since last visit?  He was seen in hospital and admitted (2/6-04/04/23 due to acute encephalopathy).  At the time, he was repeatedly removing his clothes from the closet, did  not know where he was.  All of the workup was negative including a CT of the head and MRI of the brain.  This was felt to be due to worsening dementia. Any worsening depression?:  Denies.   Hallucinations or paranoia?  Denies.   Seizures? denies    Any sleep changes?  Sleeps well.  Denies vivid dreams, REM behavior or sleepwalking   Sleep apnea?   Denies.   Any hygiene concerns? Denies.  Independent of bathing and dressing?  Endorsed  Does the patient needs help with medications?  Wife is in charge *** Who is in charge of the finances?  Wife is in charge, she is POA   *** Any changes in appetite?  denies ***   Patient have trouble swallowing? Denies.   Does the patient cook? No Any headaches?  He has a history of headaches, but they are controlled.  Chronic back pain  denies   Ambulates with difficulty?  Uses a cane for stability due to right knee arthritis.*** Recent falls or head injuries? denies     Unilateral weakness, numbness or tingling? denies   Any tremors?  Denies   Any anosmia?  Endorsed "for a long time " Any incontinence of urine?  Endorsed, especially at night, has to get up a few times to urinate any bowel dysfunction?   Denies      Patient lives with his wife*** Does the patient drive? No longer drives ***    Initial visit 10/18/2021 How long did  patient have memory difficulties?  Patient reports that his memory issues have been present for the last 2-3 months -however he is wife reports it has been about 2-3 years "if not more ".  He forgets recent conversations, and sometimes he does not answer what he is being asked.  Both short-term and long-term memory are affected. Patient lives with: Spouse  repeats oneself? endorsed, such as "where we going "several times.  "Not so much about telling the same stories " Disoriented when walking into a room? Endorsed.he has shown difficulty finding rooms inside the house such as the bedroom or bathroom.  Sometimes he has "accidents  ", thinking that is the bathroom and he urinates in the hallway.   Leaving objects in unusual places? Endorsed.  "He moves stuff everywhere "and wife cannot find it  Ambulates  with difficulty?  Occasionally he has balance problems.  His daughter states that he begins walking, then the steps get smaller, if the distance is long, and then he is at risk of falling.  Occasionally he uses a cane but not all the time. Recent falls? 2 nights ago he fell from the bed Any head injuries? 6-7 y he had a hemorrhagic stroke.  No apparent residuals according to the family. History of seizures?   Patient denies   Wandering behavior?  Patient denies his wife reports that "he just stands there ". Recently he took the wrong bus. Patient drives?   Patient no longer drives    Any mood changes such irritability agitation?  Endorsed. "He is more hateful that it used to be, especially over the last 2 months"-his wife says Any history of depression?:  denies   Hallucinations?  Patient denies   Paranoia?  Patient denies   Patient reports that sleeps "not well, roams around the house "denies vivid dreams, REM behavior or sleepwalking    History of sleep apnea?  Patient denies   Any hygiene concerns?  Endorsed especially over the last 2 months. He does not want to take a shower  Independent of bathing and dressing?  Endorsed.  His wife lines up the clothing Does the patient needs help with medications?  Wife in charge  Who is in charge of the finances?  Patient is in charge but she needs a POA because it's getting worse.  They are in the process of doing so. Any changes in appetite?  He doesn't  eat like he used to, he may be forgetting to eat  Patient have trouble swallowing? Patient denies   Does the patient cook?  Patient denies   Any kitchen accidents such as leaving the stove on? He burns my food all the time, I wanna keep him out of the kitchen Any headaches?  Endorsed, occasionally in the forehead "usually do to  blood pressure" Double vision? Patient denies   Any focal numbness or tingling?  Patient denies   Chronic back pain Patient denies   Unilateral weakness?  Patient denies   Any tremors?  Patient denies   Any history of anosmia?  For the last 2-3 years  Any incontinence of urine?  At night he urinates 3-4  night, sometimes in the hall" Any bowel dysfunction?   Patient denies   History of heavy alcohol intake?   "He's been a drank all his life, liquor, but he backed down " History of heavy tobacco use?  Patient denies   Family history of dementia?   Mother  Alzheimer's disease  10/24/2021 MRI brain personally reviewed was remarkable for mild chronic small vessel ischemic disease progressive symptoms prior MRI in 2012, repeat demonstrated chronic lacunar infarct within the right aspect of the pons, and mild generalized parenchymal atrophy.  Hospital workup 03/2023 "Chest x-ray reviewed with COVID flu negative,  MRI  brain 04/03/23 personally reviewed no acute finding, no evidence of acute or subacute infarct, remote infarcts seen as prior, mild chronic microvascular disease, cerebral atrophy.    Carotid duplex-80% stenosis bilaterally a TTE ordered for completeness and normal w/ lvef 60-65%, no rwma,  TSH B12 b1 and ammonia were normal recently.   PREVIOUS MEDICATIONS:   CURRENT MEDICATIONS:  Outpatient Encounter Medications as of 04/07/2023  Medication Sig   aspirin EC 81 MG tablet Take by mouth.   atenolol (TENORMIN) 100 MG tablet Take 100 mg by mouth daily. (Patient not taking: Reported on 04/03/2023)   Buprenorphine HCl-Naloxone HCl 4-1 MG FILM Place 1 Film under the tongue 2 (two) times daily.   Buprenorphine HCl-Naloxone HCl 8-2 MG FILM Place under the tongue 3 (three) times daily. (Patient not taking: Reported on 04/03/2023)   cetirizine (ZYRTEC) 10 MG tablet Take by mouth. (Patient not taking: Reported on 04/03/2023)   cloNIDine (CATAPRES) 0.2 MG tablet Take 1 tablet (0.2 mg total)  by mouth 2 (two) times daily.   doxazosin (CARDURA) 1 MG tablet Take 1 mg by mouth at bedtime.   esomeprazole (NEXIUM) 40 MG capsule TAKE ONE CAPSULE BY MOUTH ONCE DAILY.   fluticasone (FLONASE) 50 MCG/ACT nasal spray Place 1 spray into both nostrils daily.   furosemide (LASIX) 20 MG tablet Take 4 tablets (80 mg total) by mouth daily. (Patient not taking: Reported on 04/03/2023)   furosemide (LASIX) 40 MG tablet Take 40 mg by mouth daily.   lisinopril (ZESTRIL) 20 MG tablet TAKE ONE TABLET BY MOUTH ONCE DAILY FOR BLOOD PRESSURE (Patient taking differently: Take 20 mg by mouth daily.)   memantine (NAMENDA) 10 MG tablet Take 1 tablet twice a day (Patient taking differently: Take 10 mg by mouth daily. Take 1 tablet twice a day)   Multiple Vitamin (MULTIVITAMIN WITH MINERALS) TABS tablet Take 1 tablet by mouth daily.   pravastatin (PRAVACHOL) 20 MG tablet TAKE ONE TABLET BY MOUTH ONCE DAILY FOR CHOLESTEROL (Patient taking differently: Take 20 mg by mouth daily.)   tamsulosin (FLOMAX) 0.4 MG CAPS capsule TAKE ONE CAPSULE BY MOUTH ONCE DAILY   traZODone (DESYREL) 50 MG tablet Take 50 mg by mouth at bedtime.   No facility-administered encounter medications on file as of 04/07/2023.       08/21/2022   12:00 PM  MMSE - Mini Mental State Exam  Orientation to time 4  Orientation to Place 4  Registration 3  Attention/ Calculation 0  Recall 2  Language- name 2 objects 2  Language- repeat 1  Language- follow 3 step command 3  Language- read & follow direction 1  Write a sentence 1  Copy design 0  Total score 21      10/18/2021    8:00 AM  Montreal Cognitive Assessment   Visuospatial/ Executive (0/5) 0  Naming (0/3) 3  Attention: Read list of digits (0/2) 0  Attention: Read list of letters (0/1) 1  Attention: Serial 7 subtraction starting at 100 (0/3) 0  Language: Repeat phrase (0/2) 1  Language : Fluency (0/1) 0  Abstraction (0/2) 0  Delayed Recall (0/5) 0  Orientation (0/6) 5  Total 10   Adjusted Score (based on education)  11    Objective:     PHYSICAL EXAMINATION:    VITALS:  There were no vitals filed for this visit.  GEN:  The patient appears stated age and is in NAD. HEENT:  Normocephalic, atraumatic.   Neurological examination:  General: NAD, well-groomed, appears stated age. Orientation: The patient is alert. Oriented to person, place and date Cranial nerves: There is good facial symmetry.The speech is fluent and clear. No aphasia or dysarthria. Fund of knowledge is appropriate. Recent and remote memory are impaired. Attention and concentration are reduced.  Able to name objects and repeat phrases.  Hearing is intact to conversational tone. *** Sensation: Sensation is intact to light touch throughout Motor: Strength is at least antigravity x4. DTR's 2/4 in UE/LE     Movement examination: Tone: There is normal tone in the UE/LE Abnormal movements:  no tremor.  No myoclonus.  No asterixis.   Coordination:  There is no decremation with RAM's. Normal finger to nose  Gait and Station: The patient has some difficulty arising out of a deep-seated chair without the use of the hands. The patient's stride length is good, low with a chronic right knee pain.  Gait is cautious and narrow.    Thank you for allowing [place] the opportunity to participate in the care of this nice patient. Please do not hesitate to contact us for any questions or concerns.   Total time spent on today's visit was *** minutes dedicated to this patient today, preparing to see patient, examining the patient, ordering tests and/or medications and counseling the patient, documenting clinical information in the EHR or other health record, independently interpreting results and communicating results to the patient/family, discussing treatment and goals, answering patient's questions and coordinating care.  Cc:  Nestor Ramp, MD  Marlowe Kays 04/07/2023 6:45 AM

## 2023-04-09 NOTE — Transitions of Care (Post Inpatient/ED Visit) (Signed)
04/09/2023  Name: Edward Morgan MRN: [id number] DOB: 1950/08/15  Today's TOC FU Call Status: Today's TOC FU Call Status:: Successful TOC FU Call Completed Unsuccessful Call (1st Attempt) Date: 04/06/23 [facility] FU Call Complete Date: 04/09/23 Patient's Name and Date of Birth confirmed.  Transition Care Management Follow-up Telephone Call Date of Discharge: 04/04/23 Discharge Facility: [name]) Type of Discharge: Emergency Department Reason for ED Visit: Other: (weakness) How have you been since you were released from the hospital?: Better Any questions or concerns?: No  Items Reviewed: Did you receive and understand the discharge instructions provided?: Yes Medications obtained,verified, and reconciled?: Yes (Medications Reviewed) Any new allergies since your discharge?: No Dietary orders reviewed?: Yes Do you have support at home?: Yes People in Home: spouse  Medications Reviewed Today: Medications Reviewed Today     Reviewed by Karena Addison, LPN (Licensed Practical Nurse) on 04/09/23 at [phone]  Med List Status: <None>   Medication Order Taking? Sig Documenting Provider Last Dose Status Informant  aspirin EC 81 MG tablet [id number] No Take by mouth. [provider] 04/01/2023 Active Self, Pharmacy Records  atenolol (TENORMIN) 100 MG tablet [id number] No Take 100 mg by mouth daily.  Patient not taking: Reported on 04/03/2023   [provider] Not Taking Active Self, Pharmacy Records  Buprenorphine HCl-Naloxone HCl 4-1 MG FILM [id number] No Place 1 Film under the tongue 2 (two) times daily. [provider] 04/01/2023 Active Self, Pharmacy Records  Buprenorphine HCl-Naloxone HCl 8-2 MG FILM [id number] No Place under the tongue 3 (three) times daily.  Patient not taking: Reported on 04/03/2023   [provider] Not Taking Active Self, Pharmacy Records  cetirizine (ZYRTEC) 10 MG tablet [id number] No Take by mouth.  Patient not taking: Reported on  04/03/2023   [provider] Not Taking Active Self, Pharmacy Records  cloNIDine (CATAPRES) 0.2 MG tablet [id number] No Take 1 tablet (0.2 mg total) by mouth 2 (two) times daily. Nestor Ramp, MD 04/01/2023 Active Self, Pharmacy Records  doxazosin (CARDURA) 1 MG tablet [id number] No Take 1 mg by mouth at bedtime. [provider] 04/01/2023 Active Self, Pharmacy Records  esomeprazole (NEXIUM) 40 MG capsule [id number] No TAKE ONE CAPSULE BY MOUTH ONCE DAILY. Nestor Ramp, MD 04/01/2023 Active Self, Pharmacy Records  fluticasone [facility]) 50 MCG/ACT nasal spray [id number] No Place 1 spray into both nostrils daily. Nestor Ramp, MD Past Week Active Self, Pharmacy Records  furosemide (LASIX) 20 MG tablet [id number] No Take 4 tablets (80 mg total) by mouth daily.  Patient not taking: Reported on 04/03/2023   Nestor Ramp, MD Not Taking Active Self, Pharmacy Records  furosemide (LASIX) 40 MG tablet [id number] No Take 40 mg by mouth daily. [provider] 04/02/2023 Active Self, Pharmacy Records  lisinopril (ZESTRIL) 20 MG tablet [id number] No TAKE ONE TABLET BY MOUTH ONCE DAILY FOR BLOOD PRESSURE  Patient taking differently: Take 20 mg by mouth daily.   Nestor Ramp, MD 04/01/2023 Active Self, Pharmacy Records  memantine [facility]) 10 MG tablet [id number] No Take 1 tablet twice a day  Patient taking differently: Take 10 mg by mouth daily. Take 1 tablet twice a day   Marcos Eke, PA-C Past Week Active Self, Pharmacy Records  Multiple Vitamin (MULTIVITAMIN WITH MINERALS) TABS tablet [id number] No Take 1 tablet by mouth daily. [provider] Past Week Active Self, Pharmacy Records  pravastatin (PRAVACHOL) 20 MG tablet [id number] No TAKE ONE TABLET BY MOUTH ONCE DAILY FOR CHOLESTEROL  Patient  taking differently: Take 20 mg by mouth daily.   Nestor Ramp, MD 04/01/2023 Active Self, Pharmacy Records  tamsulosin [facility]) 0.4 MG CAPS capsule [id number] No TAKE ONE CAPSULE BY MOUTH ONCE DAILY Nestor Ramp, MD 04/01/2023 Active Self, Pharmacy Records  traZODone (DESYREL) 50 MG tablet [id number] No Take 50 mg by mouth at bedtime. [provider] 04/01/2023 Active Self, Pharmacy Records  Med List Note Lucia Gaskins, [facility] 01/27/11 0935):              Home Care and Equipment/Supplies: Were Home Health Services Ordered?: NA Any new equipment or medical supplies ordered?: NA  Functional Questionnaire: Do you need assistance with bathing/showering or dressing?: No Do you need assistance with meal preparation?: No Do you need assistance with eating?: No Do you have difficulty maintaining continence: No Do you need assistance with getting out of bed/getting out of a chair/moving?: No Do you have difficulty managing or taking your medications?: No  Follow up appointments reviewed: PCP Follow-up appointment confirmed?: Yes Date of PCP follow-up appointment?: 04/10/23 Follow-up Provider: [facility] Follow-up appointment confirmed?: NA Do you need transportation to your follow-up appointment?: No Do you understand care options if your condition(s) worsen?: Yes-patient verbalized understanding    SIGNATURE Karena Addison, LPN [facility] Nurse Health Advisor Direct Dial [phone]

## 2023-04-09 NOTE — Progress Notes (Signed)
Assessment/Plan:   Dementia likely due to Alzheimer's disease***  Edward Morgan is a delightful  73 y.o. RH male with a history of hypertension, hyperlipidemia, CKD4 history of CVA, GERD, history of treated hep C, and a history of dementia likely due to Alzheimer's disease seen today in follow up for memory loss. Patient is currently on memantine 10 mg twice daily.  He was recently admitted to the hospital with acute confusional state, felt to be due to progressing dementia, as all of the workup negative.  He was able to answer simple questions during that time,.  Recall, in the past he declined physical therapy despite recommendations to do so due to lack of activity.   Follow up in   months. Recommend EEG for transient alteration of awareness recommend good control of her cardiovascular risk factors.  Patient informed of BP readings. Continue to control mood as per PCP Recommend increasing activity.  Once again, during the hospitalization, PT OT was recommended, but the patient declines.    Subjective:    This patient is accompanied in the office by *** who supplements the history.  Previous records as well as any outside records available were reviewed prior to todays visit. Patient was last seen on 02/09/2023, last MMSE 21/30 on 08/21/2022  Any changes in memory since last visit? ".  He has some difficulty remembering new information, recent conversations and names but able to recognize their faces.  He likes to watch sports on TV, does not like to do any brain games. Repeats oneself?  Endorsed Disoriented when walking into a room?  "Sometimes I get confused and I go to the wrong room in the house "***  Leaving objects?  "All the time, I lose everything "***  Wandering behavior?  denies   Any personality changes since last visit?  He was seen in hospital and admitted (2/6-04/04/23 due to acute encephalopathy).  At the time, he was repeatedly removing his clothes from the closet, did  not know where he was.  All of the workup was negative including a CT of the head and MRI of the brain.  This was felt to be due to worsening dementia. Any worsening depression?:  Denies.   Hallucinations or paranoia?  Denies.   Seizures? denies    Any sleep changes?  Sleeps well.  Denies vivid dreams, REM behavior or sleepwalking   Sleep apnea?   Denies.   Any hygiene concerns? Denies.  Independent of bathing and dressing?  Endorsed  Does the patient needs help with medications?  Wife is in charge *** Who is in charge of the finances?  Wife is in charge, she is POA   *** Any changes in appetite?  denies ***   Patient have trouble swallowing? Denies.   Does the patient cook? No Any headaches?  He has a history of headaches, but they are controlled.  Chronic back pain  denies   Ambulates with difficulty?  Uses a cane for stability due to right knee arthritis.*** Recent falls or head injuries? denies     Unilateral weakness, numbness or tingling? denies   Any tremors?  Denies   Any anosmia?  Endorsed "for a long time " Any incontinence of urine?  Endorsed, especially at night, has to get up a few times to urinate any bowel dysfunction?   Denies      Patient lives with his wife*** Does the patient drive? No longer drives ***    Initial visit 10/18/2021 How long did  patient have memory difficulties?  Patient reports that his memory issues have been present for the last 2-3 months -however he is wife reports it has been about 2-3 years "if not more ".  He forgets recent conversations, and sometimes he does not answer what he is being asked.  Both short-term and long-term memory are affected. Patient lives with: Spouse  repeats oneself? endorsed, such as "where we going "several times.  "Not so much about telling the same stories " Disoriented when walking into a room? Endorsed.he has shown difficulty finding rooms inside the house such as the bedroom or bathroom.  Sometimes he has "accidents  ", thinking that is the bathroom and he urinates in the hallway.   Leaving objects in unusual places? Endorsed.  "He moves stuff everywhere "and wife cannot find it  Ambulates  with difficulty?  Occasionally he has balance problems.  His daughter states that he begins walking, then the steps get smaller, if the distance is long, and then he is at risk of falling.  Occasionally he uses a cane but not all the time. Recent falls? 2 nights ago he fell from the bed Any head injuries? 6-7 y he had a hemorrhagic stroke.  No apparent residuals according to the family. History of seizures?   Patient denies   Wandering behavior?  Patient denies his wife reports that "he just stands there ". Recently he took the wrong bus. Patient drives?   Patient no longer drives    Any mood changes such irritability agitation?  Endorsed. "He is more hateful that it used to be, especially over the last 2 months"-his wife says Any history of depression?:  denies   Hallucinations?  Patient denies   Paranoia?  Patient denies   Patient reports that sleeps "not well, roams around the house "denies vivid dreams, REM behavior or sleepwalking    History of sleep apnea?  Patient denies   Any hygiene concerns?  Endorsed especially over the last 2 months. He does not want to take a shower  Independent of bathing and dressing?  Endorsed.  His wife lines up the clothing Does the patient needs help with medications?  Wife in charge  Who is in charge of the finances?  Patient is in charge but she needs a POA because it's getting worse.  They are in the process of doing so. Any changes in appetite?  He doesn't  eat like he used to, he may be forgetting to eat  Patient have trouble swallowing? Patient denies   Does the patient cook?  Patient denies   Any kitchen accidents such as leaving the stove on? He burns my food all the time, I wanna keep him out of the kitchen Any headaches?  Endorsed, occasionally in the forehead "usually do to  blood pressure" Double vision? Patient denies   Any focal numbness or tingling?  Patient denies   Chronic back pain Patient denies   Unilateral weakness?  Patient denies   Any tremors?  Patient denies   Any history of anosmia?  For the last 2-3 years  Any incontinence of urine?  At night he urinates 3-4  night, sometimes in the hall" Any bowel dysfunction?   Patient denies   History of heavy alcohol intake?   "He's been a drank all his life, liquor, but he backed down " History of heavy tobacco use?  Patient denies   Family history of dementia?   Mother  Alzheimer's disease  10/24/2021 MRI brain personally reviewed was remarkable for mild chronic small vessel ischemic disease progressive symptoms prior MRI in 2012, repeat demonstrated chronic lacunar infarct within the right aspect of the pons, and mild generalized parenchymal atrophy.  Hospital workup 03/2023 "Chest x-ray reviewed with COVID flu negative,  MRI  brain 04/03/23 personally reviewed no acute finding, no evidence of acute or subacute infarct, remote infarcts seen as prior, mild chronic microvascular disease, cerebral atrophy.    Carotid duplex-80% stenosis bilaterally a TTE ordered for completeness and normal w/ lvef 60-65%, no rwma,  TSH B12 b1 and ammonia were normal recently.   PREVIOUS MEDICATIONS:   CURRENT MEDICATIONS:  Outpatient Encounter Medications as of 04/10/2023  Medication Sig   aspirin EC 81 MG tablet Take by mouth.   atenolol (TENORMIN) 100 MG tablet Take 100 mg by mouth daily. (Patient not taking: Reported on 04/03/2023)   Buprenorphine HCl-Naloxone HCl 4-1 MG FILM Place 1 Film under the tongue 2 (two) times daily.   Buprenorphine HCl-Naloxone HCl 8-2 MG FILM Place under the tongue 3 (three) times daily. (Patient not taking: Reported on 04/03/2023)   cetirizine (ZYRTEC) 10 MG tablet Take by mouth. (Patient not taking: Reported on 04/03/2023)   cloNIDine (CATAPRES) 0.2 MG tablet Take 1 tablet (0.2 mg total)  by mouth 2 (two) times daily.   doxazosin (CARDURA) 1 MG tablet Take 1 mg by mouth at bedtime.   esomeprazole (NEXIUM) 40 MG capsule TAKE ONE CAPSULE BY MOUTH ONCE DAILY.   fluticasone (FLONASE) 50 MCG/ACT nasal spray Place 1 spray into both nostrils daily.   furosemide (LASIX) 20 MG tablet Take 4 tablets (80 mg total) by mouth daily. (Patient not taking: Reported on 04/03/2023)   furosemide (LASIX) 40 MG tablet Take 40 mg by mouth daily.   lisinopril (ZESTRIL) 20 MG tablet TAKE ONE TABLET BY MOUTH ONCE DAILY FOR BLOOD PRESSURE (Patient taking differently: Take 20 mg by mouth daily.)   memantine (NAMENDA) 10 MG tablet Take 1 tablet twice a day (Patient taking differently: Take 10 mg by mouth daily. Take 1 tablet twice a day)   Multiple Vitamin (MULTIVITAMIN WITH MINERALS) TABS tablet Take 1 tablet by mouth daily.   pravastatin (PRAVACHOL) 20 MG tablet TAKE ONE TABLET BY MOUTH ONCE DAILY FOR CHOLESTEROL (Patient taking differently: Take 20 mg by mouth daily.)   tamsulosin (FLOMAX) 0.4 MG CAPS capsule TAKE ONE CAPSULE BY MOUTH ONCE DAILY   traZODone (DESYREL) 50 MG tablet Take 50 mg by mouth at bedtime.   No facility-administered encounter medications on file as of 04/10/2023.       08/21/2022   12:00 PM  MMSE - Mini Mental State Exam  Orientation to time 4  Orientation to Place 4  Registration 3  Attention/ Calculation 0  Recall 2  Language- name 2 objects 2  Language- repeat 1  Language- follow 3 step command 3  Language- read & follow direction 1  Write a sentence 1  Copy design 0  Total score 21      10/18/2021    8:00 AM  Montreal Cognitive Assessment   Visuospatial/ Executive (0/5) 0  Naming (0/3) 3  Attention: Read list of digits (0/2) 0  Attention: Read list of letters (0/1) 1  Attention: Serial 7 subtraction starting at 100 (0/3) 0  Language: Repeat phrase (0/2) 1  Language : Fluency (0/1) 0  Abstraction (0/2) 0  Delayed Recall (0/5) 0  Orientation (0/6) 5  Total 10   Adjusted Score (based on education)  11    Objective:     PHYSICAL EXAMINATION:    VITALS:  There were no vitals filed for this visit.  GEN:  The patient appears stated age and is in NAD. HEENT:  Normocephalic, atraumatic.   Neurological examination:  General: NAD, well-groomed, appears stated age. Orientation: The patient is alert. Oriented to person, place and date Cranial nerves: There is good facial symmetry.The speech is fluent and clear. No aphasia or dysarthria. Fund of knowledge is appropriate. Recent and remote memory are impaired. Attention and concentration are reduced.  Able to name objects and repeat phrases.  Hearing is intact to conversational tone. *** Sensation: Sensation is intact to light touch throughout Motor: Strength is at least antigravity x4. DTR's 2/4 in UE/LE     Movement examination: Tone: There is normal tone in the UE/LE Abnormal movements:  no tremor.  No myoclonus.  No asterixis.   Coordination:  There is no decremation with RAM's. Normal finger to nose  Gait and Station: The patient has some difficulty arising out of a deep-seated chair without the use of the hands. The patient's stride length is good, low with a chronic right knee pain.  Gait is cautious and narrow.    Thank you for allowing [place] the opportunity to participate in the care of this nice patient. Please do not hesitate to contact us for any questions or concerns.   Total time spent on today's visit was *** minutes dedicated to this patient today, preparing to see patient, examining the patient, ordering tests and/or medications and counseling the patient, documenting clinical information in the EHR or other health record, independently interpreting results and communicating results to the patient/family, discussing treatment and goals, answering patient's questions and coordinating care.  Cc:  Nestor Ramp, MD  Marlowe Kays 04/09/2023 12:57 PM

## 2023-04-10 ENCOUNTER — Ambulatory Visit (INDEPENDENT_AMBULATORY_CARE_PROVIDER_SITE_OTHER): Payer: 59 | Admitting: Physician Assistant

## 2023-04-10 ENCOUNTER — Encounter: Payer: Self-pay | Admitting: Physician Assistant

## 2023-04-10 VITALS — BP 209/90 | HR 63 | Resp 20 | Ht 72.0 in | Wt 205.0 lb

## 2023-04-10 DIAGNOSIS — R404 Transient alteration of awareness: Secondary | ICD-10-CM

## 2023-04-10 DIAGNOSIS — F0390 Unspecified dementia without behavioral disturbance: Secondary | ICD-10-CM | POA: Diagnosis not present

## 2023-04-10 NOTE — Patient Instructions (Signed)
 It was a pleasure to see you today at our office.   Recommendations:  Follow up in 6  months  Continue memantine 5 mg daily (meaning half pill in the morning) EEG   Whom to call:  Memory  decline, memory medications: Call our office [phone]   For psychiatric meds, mood meds: Please have your primary care physician manage these medications.     For assessment of decision of mental capacity and competency:  Call Dr. Erick Blinks, geriatric psychiatrist at [phone]    For guidance regarding [facility] and if placement were needed at the facility, contact Sidney Ace, Social Worker tel: [phone]  If you have any severe symptoms of a stroke, or other severe issues such as confusion,severe chills or fever, etc call 911 or go to the ER as you may need to be evaluated further      RECOMMENDATIONS FOR ALL PATIENTS WITH MEMORY PROBLEMS: 1. Continue to exercise (Recommend 30 minutes of walking everyday, or 3 hours every week) 2. Increase social interactions - continue going to [place] and enjoy social gatherings with friends and family 3. Eat healthy, avoid fried foods and eat more fruits and vegetables 4. Maintain adequate blood pressure, blood sugar, and blood cholesterol level. Reducing the risk of stroke and cardiovascular disease also helps promoting better memory. 5. Avoid stressful situations. Live a simple life and avoid aggravations. Organize your time and prepare for the next day in anticipation. 6. Sleep well, avoid any interruptions of sleep and avoid any distractions in the bedroom that may interfere with adequate sleep quality 7. Avoid sugar, avoid sweets as there is a strong link between excessive sugar intake, diabetes, and cognitive impairment We discussed the Mediterranean diet, which has been shown to help patients reduce the risk of progressive memory disorders and reduces cardiovascular risk. This includes eating fish, eat fruits and  green leafy vegetables, nuts like almonds and hazelnuts, walnuts, and also use olive oil. Avoid fast foods and fried foods as much as possible. Avoid sweets and sugar as sugar use has been linked to worsening of memory function.  There is always a concern of gradual progression of memory problems. If this is the case, then we may need to adjust level of care according to patient needs. Support, both to the patient and caregiver, should then be put into place.    The Alzheimer's Association is here all day, every day for people facing Alzheimer's disease through our free 24/7 Helpline: [phone]. The Helpline provides reliable information and support to all those who need assistance, such as individuals living with memory loss, Alzheimer's or other dementia, caregivers, health care professionals and the public.  Our highly trained and knowledgeable staff can help you with: Understanding memory loss, dementia and Alzheimer's  Medications and other treatment options  General information about aging and brain health  Skills to provide quality care and to find the best care from professionals  Legal, financial and living-arrangement decisions Our Helpline also features: Confidential care consultation provided by master's level clinicians who can help with decision-making support, crisis assistance and education on issues families face every day  Help in a caller's preferred language using our translation service that features more than 200 languages and dialects  Referrals to local community programs, services and ongoing support     FALL PRECAUTIONS: Be cautious when walking. Scan the area for obstacles that may increase the risk of trips and falls. When getting up in the mornings, sit up at  the edge of the bed for a few minutes before getting out of bed. Consider elevating the bed at the head end to avoid drop of blood pressure when getting up. Walk always in a well-lit room (use night lights in  the walls). Avoid area rugs or power cords from appliances in the middle of the walkways. Use a walker or a cane if necessary and consider physical therapy for balance exercise. Get your eyesight checked regularly.  FINANCIAL OVERSIGHT: Supervision, especially oversight when making financial decisions or transactions is also recommended.  HOME SAFETY: Consider the safety of the kitchen when operating appliances like stoves, microwave oven, and blender. Consider having supervision and share cooking responsibilities until no longer able to participate in those. Accidents with firearms and other hazards in the house should be identified and addressed as well.   ABILITY TO BE LEFT ALONE: If patient is unable to contact 911 operator, consider using LifeLine, or when the need is there, arrange for someone to stay with patients. Smoking is a fire hazard, consider supervision or cessation. Risk of wandering should be assessed by caregiver and if detected at any point, supervision and safe proof recommendations should be instituted.  MEDICATION SUPERVISION: Inability to self-administer medication needs to be constantly addressed. Implement a mechanism to ensure safe administration of the medications.   DRIVING: Regarding driving, in patients with progressive memory problems, driving will be impaired. We advise to have someone else do the driving if trouble finding directions or if minor accidents are reported. Independent driving assessment is available to determine safety of driving.   If you are interested in the driving assessment, you can contact the following:  [employer] in [place] [phone]  [facility] [phone]  [facility] [phone] or [phone]      Mediterranean Diet A Mediterranean diet refers to food and lifestyle choices that are based on the traditions of countries located on the [employer]. This way of eating has been shown to help prevent certain conditions and improve outcomes for people who have chronic diseases, like kidney disease and heart disease. What are tips for following this plan? Lifestyle  Cook and eat meals together with your family, when possible. Drink enough fluid to keep your urine clear or pale yellow. Be physically active every day. This includes: Aerobic exercise like running or swimming. Leisure activities like gardening, walking, or housework. Get 7-8 hours of sleep each night. If recommended by your health care provider, drink red wine in moderation. This means 1 glass a day for nonpregnant women and 2 glasses a day for men. A glass of wine equals 5 oz (150 mL). Reading food labels  Check the serving size of packaged foods. For foods such as rice and pasta, the serving size refers to the amount of cooked product, not dry. Check the total fat in packaged foods. Avoid foods that have saturated fat or trans fats. Check the ingredients list for added sugars, such as corn syrup. Shopping  At the grocery store, buy most of your food from the areas near the walls of the store. This includes: Fresh fruits and vegetables (produce). Grains, beans, nuts, and seeds. Some of these may be available in unpackaged forms or large amounts (in bulk). Fresh seafood. Poultry and eggs. Low-fat dairy products. Buy whole ingredients instead of prepackaged foods. Buy fresh fruits and vegetables in-season from local farmers markets. Buy frozen fruits and vegetables in resealable bags. If you do not  have access to quality fresh seafood, buy precooked frozen shrimp or canned fish, such as tuna, salmon, or sardines. Buy small amounts of raw or cooked vegetables, salads, or olives from the deli or salad bar at your store. Stock your pantry so you always have certain foods on hand, such as olive oil, canned tuna, canned tomatoes, rice, pasta, and beans. Cooking  Cook foods  with extra-virgin olive oil instead of using butter or other vegetable oils. Have meat as a side dish, and have vegetables or grains as your main dish. This means having meat in small portions or adding small amounts of meat to foods like pasta or stew. Use beans or vegetables instead of meat in common dishes like chili or lasagna. Experiment with different cooking methods. Try roasting or broiling vegetables instead of steaming or sauteing them. Add frozen vegetables to soups, stews, pasta, or rice. Add nuts or seeds for added healthy fat at each meal. You can add these to yogurt, salads, or vegetable dishes. Marinate fish or vegetables using olive oil, lemon juice, garlic, and fresh herbs. Meal planning  Plan to eat 1 vegetarian meal one day each week. Try to work up to 2 vegetarian meals, if possible. Eat seafood 2 or more times a week. Have healthy snacks readily available, such as: Vegetable sticks with hummus. Greek yogurt. Fruit and nut trail mix. Eat balanced meals throughout the week. This includes: Fruit: 2-3 servings a day Vegetables: 4-5 servings a day Low-fat dairy: 2 servings a day Fish, poultry, or lean meat: 1 serving a day Beans and legumes: 2 or more servings a week Nuts and seeds: 1-2 servings a day Whole grains: 6-8 servings a day Extra-virgin olive oil: 3-4 servings a day Limit red meat and sweets to only a few servings a month What are my food choices? Mediterranean diet Recommended Grains: Whole-grain pasta. Brown rice. Bulgar wheat. Polenta. Couscous. Whole-wheat bread. Orpah Cobb. Vegetables: Artichokes. Beets. Broccoli. Cabbage. Carrots. Eggplant. Green beans. Chard. Kale. Spinach. Onions. Leeks. Peas. Squash. Tomatoes. Peppers. Radishes. Fruits: Apples. Apricots. Avocado. Berries. Bananas. Cherries. Dates. Figs. Grapes. Lemons. Melon. Oranges. Peaches. Plums. Pomegranate. Meats and other protein foods: Beans. Almonds. Sunflower seeds. Pine nuts. Peanuts.  Cod. Salmon. Scallops. Shrimp. Tuna. Tilapia. Clams. Oysters. Eggs. Dairy: Low-fat milk. Cheese. Greek yogurt. Beverages: Water. Red wine. Herbal tea. Fats and oils: Extra virgin olive oil. Avocado oil. Grape seed oil. Sweets and desserts: [place] yogurt with honey. Baked apples. Poached pears. Trail mix. Seasoning and other foods: Basil. Cilantro. Coriander. Cumin. Mint. Parsley. Sage. Rosemary. Tarragon. Garlic. Oregano. Thyme. Pepper. Balsalmic vinegar. Tahini. Hummus. Tomato sauce. Olives. Mushrooms. Limit these Grains: Prepackaged pasta or rice dishes. Prepackaged cereal with added sugar. Vegetables: Deep fried potatoes (french fries). Fruits: Fruit canned in syrup. Meats and other protein foods: Beef. Pork. Lamb. Poultry with skin. Hot dogs. Tomasa Blase. Dairy: Ice cream. Sour cream. Whole milk. Beverages: Juice. Sugar-sweetened soft drinks. Beer. Liquor and spirits. Fats and oils: Butter. Canola oil. Vegetable oil. Beef fat (tallow). Lard. Sweets and desserts: Cookies. Cakes. Pies. Candy. Seasoning and other foods: Mayonnaise. Premade sauces and marinades. The items listed may not be a complete list. Talk with your dietitian about what dietary choices are right for you. Summary The Mediterranean diet includes both food and lifestyle choices. Eat a variety of fresh fruits and vegetables, beans, nuts, seeds, and whole grains. Limit the amount of red meat and sweets that you eat. Talk with your health care provider about whether it is safe for you  to drink red wine in moderation. This means 1 glass a day for nonpregnant women and 2 glasses a day for men. A glass of wine equals 5 oz (150 mL). This information is not intended to replace advice given to you by your health care provider. Make sure you discuss any questions you have with your health care provider. Document Released: 10/04/2015 Document Revised: 11/06/2015 Document Reviewed: 10/04/2015 Elsevier Interactive Patient Education  2017  [employer].   We have sent a referral to [facility] for your MRI and they will call you directly to schedule your appointment. They are located at [address] [facility]. If you need to contact them directly please call [phone].  Your provider has requested that you have labwork completed today. Please go to [facility] ([id number]) on the second floor of this building before leaving the office today. You do not need to check in. If you are not called within 15 minutes please check with the front desk.

## 2023-04-14 ENCOUNTER — Other Ambulatory Visit: Payer: 59

## 2023-04-15 ENCOUNTER — Ambulatory Visit: Payer: 59 | Admitting: Family Medicine

## 2023-04-22 ENCOUNTER — Encounter: Payer: Self-pay | Admitting: Family Medicine

## 2023-04-22 ENCOUNTER — Ambulatory Visit (INDEPENDENT_AMBULATORY_CARE_PROVIDER_SITE_OTHER): Payer: 59 | Admitting: Family Medicine

## 2023-04-22 ENCOUNTER — Ambulatory Visit: Payer: 59 | Admitting: Family Medicine

## 2023-04-22 VITALS — BP 194/80 | HR 61 | Ht 72.0 in | Wt 208.0 lb

## 2023-04-22 DIAGNOSIS — N184 Chronic kidney disease, stage 4 (severe): Secondary | ICD-10-CM

## 2023-04-22 DIAGNOSIS — I10 Essential (primary) hypertension: Secondary | ICD-10-CM

## 2023-04-22 DIAGNOSIS — G301 Alzheimer's disease with late onset: Secondary | ICD-10-CM

## 2023-04-22 DIAGNOSIS — G894 Chronic pain syndrome: Secondary | ICD-10-CM

## 2023-04-22 DIAGNOSIS — F02B18 Dementia in other diseases classified elsewhere, moderate, with other behavioral disturbance: Secondary | ICD-10-CM

## 2023-04-22 MED ORDER — AMLODIPINE BESY-BENAZEPRIL HCL 10-20 MG PO CAPS: 1.0000 | ORAL_CAPSULE | Freq: Every day | ORAL | 3 refills | Status: DC

## 2023-04-22 NOTE — Patient Instructions (Addendum)
 We are STOPPING the lisinopril and the lasix (furosemide) we are starting a combo med that is amlodipine 10/benazapril 20.  Keep taking the other blood pressure medicines which include those circled on your After Visit Summary.

## 2023-04-23 NOTE — Assessment & Plan Note (Signed)
 He seems to be doing pretty well.  Recent follow-up with neurology and they made some medication changes.  They will continue to follow with neurology.  I did discuss issues with his daughter and things are understandably up-and-down with some days better than others.

## 2023-04-23 NOTE — Assessment & Plan Note (Signed)
 He continues to be followed at the Suboxone clinic.  They have recently tapered his dose down he seems to be fine with the current dose.  Denies having cravings

## 2023-04-23 NOTE — Assessment & Plan Note (Signed)
 Will check labs today.  He is being followed by nephrology.  We discussed medications and the best way to help his kidneys is to keep his blood pressure better controlled.

## 2023-04-23 NOTE — Assessment & Plan Note (Signed)
 He is already on multiple agents.  I will discontinue Lasix and the amlodipine and change it instead for combination medication.  Follow-up 4 weeks.  Hopefully I can get them to send some blood pressure readings in in the interim.  Labs today.

## 2023-04-23 NOTE — Progress Notes (Signed)
    CHIEF COMPLAINT / HPI: Here for hospital follow-up with main complaint of blood pressure not being controlled.  Initially his daughter was not with him so I interviewed and examined him alone and then I talked with her additionally. He says he is taking his medicines quite regularly.  He is not sure why his blood pressure seems so high.  He has occasional headache with that but no chest pain.  #2.  Memory change: He has seen the neurologist recently and they changed his medication around a little bit because the benazepril at nighttime dose was giving him nightmares so they have discontinued that.  His nightmares have improved. 3.  CKD 4: Wants me to make sure that what ever I give him to manage his blood pressure does not hurt his kidneys.   PERTINENT  PMH / PSH: I have reviewed the patient's medications, allergies, past medical and surgical history, smoking status and updated in the EMR as appropriate.   OBJECTIVE:  BP (!) 194/80   Pulse 61   Ht 6' (1.829 m)   Wt 208 lb (94.3 kg)   SpO2 100%   BMI 28.21 kg/m   Vital signs reviewed. GENERAL: Well-developed, well-nourished, no acute distress. CARDIOVASCULAR: Regular rate and rhythm no murmur gallop or rub LUNGS: Clear to auscultation bilaterally, no rales or wheeze. ABDOMEN: Soft positive bowel sounds NEURO: No gross focal neurological deficits. MSK: Movement of extremity x 4. PSYCH: Alert and oriented to person place and month.  Agreeable.  Follows commands.  Asks questions.  Answer simple questions easily.  Follows commands.  Affect is interactive.  No agitation or psychomotor retardation ASSESSMENT / PLAN:   Essential hypertension He is already on multiple agents.  I will discontinue Lasix and the amlodipine and change it instead for combination medication.  Follow-up 4 weeks.  Hopefully I can get them to send some blood pressure readings in in the interim.  Labs today.  Chronic kidney disease (CKD), stage IV (severe)  (HCC) Will check labs today.  He is being followed by nephrology.  We discussed medications and the best way to help his kidneys is to keep his blood pressure better controlled.  Chronic pain syndrome on Suboxone He continues to be followed at the Suboxone clinic.  They have recently tapered his dose down he seems to be fine with the current dose.  Denies having cravings  DAT (dementia of Alzheimer type) (HCC) He seems to be doing pretty well.  Recent follow-up with neurology and they made some medication changes.  They will continue to follow with neurology.  I did discuss issues with his daughter and things are understandably up-and-down with some days better than others.   Denny Levy MD

## 2023-04-27 DIAGNOSIS — Z79891 Long term (current) use of opiate analgesic: Secondary | ICD-10-CM | POA: Diagnosis not present

## 2023-04-27 DIAGNOSIS — Z5181 Encounter for therapeutic drug level monitoring: Secondary | ICD-10-CM | POA: Diagnosis not present

## 2023-04-27 DIAGNOSIS — E7800 Pure hypercholesterolemia, unspecified: Secondary | ICD-10-CM | POA: Diagnosis not present

## 2023-04-27 DIAGNOSIS — Z79899 Other long term (current) drug therapy: Secondary | ICD-10-CM | POA: Diagnosis not present

## 2023-04-27 DIAGNOSIS — I10 Essential (primary) hypertension: Secondary | ICD-10-CM | POA: Diagnosis not present

## 2023-04-27 DIAGNOSIS — R7303 Prediabetes: Secondary | ICD-10-CM | POA: Diagnosis not present

## 2023-05-01 DIAGNOSIS — Z79899 Other long term (current) drug therapy: Secondary | ICD-10-CM | POA: Diagnosis not present

## 2023-05-11 ENCOUNTER — Telehealth: Payer: Self-pay | Admitting: Physician Assistant

## 2023-05-11 NOTE — Telephone Encounter (Signed)
 Pt. Wife says he is not getting any better, can he get another Rx please call [phone] Eber Jones

## 2023-05-11 NOTE — Telephone Encounter (Signed)
 Pt wife called back to see what was going on no answer left a voice mail to call the office back

## 2023-05-12 ENCOUNTER — Other Ambulatory Visit: Payer: Self-pay | Admitting: Physician Assistant

## 2023-05-12 MED ORDER — DIVALPROEX SODIUM 125 MG PO DR TAB: DELAYED_RELEASE_TABLET | ORAL | 11 refills | Status: AC

## 2023-05-12 NOTE — Telephone Encounter (Signed)
 I just saw him on 2/14. No indication for earlier office visit. Will start him on a medication for mood and try. Depakote 125 mg at night. After 1 week, may try 1 tab twice a day. Thanks

## 2023-05-12 NOTE — Telephone Encounter (Signed)
 Pt wife stated that he will not take the memantine 5mg  because the pill is cut in half he is throwing clothes and acting out, pt wife stated that he needs a different medication or something. And that he needs to come back in to see Huntley Dec,

## 2023-05-12 NOTE — Telephone Encounter (Signed)
 Let me look into this. Please do not schedule any earlier appt for now until I look at chart. Thanks

## 2023-05-18 ENCOUNTER — Other Ambulatory Visit: Payer: Self-pay | Admitting: Family Medicine

## 2023-05-28 DIAGNOSIS — I10 Essential (primary) hypertension: Secondary | ICD-10-CM | POA: Diagnosis not present

## 2023-05-28 DIAGNOSIS — R7303 Prediabetes: Secondary | ICD-10-CM | POA: Diagnosis not present

## 2023-05-28 DIAGNOSIS — Z5181 Encounter for therapeutic drug level monitoring: Secondary | ICD-10-CM | POA: Diagnosis not present

## 2023-05-28 DIAGNOSIS — Z79899 Other long term (current) drug therapy: Secondary | ICD-10-CM | POA: Diagnosis not present

## 2023-05-28 DIAGNOSIS — E7800 Pure hypercholesterolemia, unspecified: Secondary | ICD-10-CM | POA: Diagnosis not present

## 2023-05-28 DIAGNOSIS — Z79891 Long term (current) use of opiate analgesic: Secondary | ICD-10-CM | POA: Diagnosis not present

## 2023-06-01 DIAGNOSIS — Z79899 Other long term (current) drug therapy: Secondary | ICD-10-CM | POA: Diagnosis not present

## 2023-06-03 ENCOUNTER — Ambulatory Visit (INDEPENDENT_AMBULATORY_CARE_PROVIDER_SITE_OTHER): Payer: 59 | Admitting: Podiatry

## 2023-06-03 ENCOUNTER — Encounter: Payer: Self-pay | Admitting: Podiatry

## 2023-06-03 DIAGNOSIS — M79675 Pain in left toe(s): Secondary | ICD-10-CM

## 2023-06-03 DIAGNOSIS — M79674 Pain in right toe(s): Secondary | ICD-10-CM | POA: Diagnosis not present

## 2023-06-03 DIAGNOSIS — N184 Chronic kidney disease, stage 4 (severe): Secondary | ICD-10-CM

## 2023-06-03 DIAGNOSIS — B351 Tinea unguium: Secondary | ICD-10-CM

## 2023-06-03 NOTE — Progress Notes (Signed)
This patient returns to my office for at risk foot care.  This patient requires this care by a professional since this patient will be at risk due to having dementia, right foot drop and CKD.  This patient is unable to cut nails himself since the patient cannot reach his nails.These nails are painful walking and wearing shoes.  This patient presents for at risk foot care today.  General Appearance  Alert, conversant and in no acute stress.  Vascular  Dorsalis pedis and posterior tibial  pulses are palpable  bilaterally.  Capillary return is within normal limits  bilaterally. Temperature is within normal limits  bilaterally.  Neurologic  Senn-Weinstein monofilament wire test within normal limits  bilaterally. Muscle power within normal limits bilaterally.  Nails Thick disfigured discolored nails with subungual debris  from hallux to fifth toes bilaterally. No evidence of bacterial infection or drainage bilaterally.  Orthopedic  No limitations of motion  feet .  No crepitus or effusions noted.  HAV  B/L.  Skin  normotropic skin with no porokeratosis noted bilaterally.  No signs of infections or ulcers noted.     Onychomycosis  Pain in right toes  Pain in left toes  Consent was obtained for treatment procedures.   Mechanical debridement of nails 1-5  bilaterally performed with a nail nipper.  Filed with dremel without incident.    Return office visit     3 months                 Told patient to return for periodic foot care and evaluation due to potential at risk complications.   Helane Gunther DPM

## 2023-06-25 DIAGNOSIS — Z79891 Long term (current) use of opiate analgesic: Secondary | ICD-10-CM | POA: Diagnosis not present

## 2023-06-25 DIAGNOSIS — Z79899 Other long term (current) drug therapy: Secondary | ICD-10-CM | POA: Diagnosis not present

## 2023-06-25 DIAGNOSIS — I10 Essential (primary) hypertension: Secondary | ICD-10-CM | POA: Diagnosis not present

## 2023-06-25 DIAGNOSIS — Z5181 Encounter for therapeutic drug level monitoring: Secondary | ICD-10-CM | POA: Diagnosis not present

## 2023-06-26 DIAGNOSIS — Z79899 Other long term (current) drug therapy: Secondary | ICD-10-CM | POA: Diagnosis not present

## 2023-06-30 ENCOUNTER — Other Ambulatory Visit: Payer: Self-pay | Admitting: Nephrology

## 2023-06-30 DIAGNOSIS — N2581 Secondary hyperparathyroidism of renal origin: Secondary | ICD-10-CM | POA: Diagnosis not present

## 2023-06-30 DIAGNOSIS — I129 Hypertensive chronic kidney disease with stage 1 through stage 4 chronic kidney disease, or unspecified chronic kidney disease: Secondary | ICD-10-CM | POA: Diagnosis not present

## 2023-06-30 DIAGNOSIS — G309 Alzheimer's disease, unspecified: Secondary | ICD-10-CM | POA: Diagnosis not present

## 2023-06-30 DIAGNOSIS — N184 Chronic kidney disease, stage 4 (severe): Secondary | ICD-10-CM

## 2023-06-30 DIAGNOSIS — N281 Cyst of kidney, acquired: Secondary | ICD-10-CM | POA: Diagnosis not present

## 2023-06-30 DIAGNOSIS — R809 Proteinuria, unspecified: Secondary | ICD-10-CM | POA: Diagnosis not present

## 2023-07-01 ENCOUNTER — Ambulatory Visit
Admission: RE | Admit: 2023-07-01 | Discharge: 2023-07-01 | Disposition: A | Discharge status: Home / Self Care | Source: Ambulatory Visit | Attending: Nephrology | Admitting: Nephrology

## 2023-07-01 DIAGNOSIS — R809 Proteinuria, unspecified: Secondary | ICD-10-CM | POA: Diagnosis not present

## 2023-07-01 DIAGNOSIS — N184 Chronic kidney disease, stage 4 (severe): Secondary | ICD-10-CM

## 2023-07-01 LAB — LAB REPORT - SCANNED: PTH, Intact: 160

## 2023-08-11 ENCOUNTER — Ambulatory Visit: Payer: 59 | Admitting: Physician Assistant

## 2023-09-02 ENCOUNTER — Ambulatory Visit: Admitting: Podiatry

## 2023-09-17 ENCOUNTER — Ambulatory Visit: Admitting: Physician Assistant

## 2023-09-17 NOTE — Progress Notes (Incomplete)
 Assessment/Plan:   Dementia likely due to Alzheimer disease***  Edward Morgan is a very pleasant 73 y.o. RH male with a history of hypertension, hyperlipidemia, CKD4 history of CVA, GERD, history of treated hep C, and a history of dementia likely due to Alzheimer's disease seen today in follow up for memory loss. Patient was on memantine  10 mg twice daily but***. Patient is able to participate on ADLs to his ability***and to drive without significant difficulties.  Mood is controlled with Depakote .***    Follow up in   months. Continue memantine  5 mg twice daily (unable to tolerate 10 mg)*** Continue Depakote  125 mg nightly, may increase to 125 mg twice daily as needed, side effects discussed Recommend good control of her cardiovascular risk factors.  Patient inform office BP readings Continue to control mood as per PCP Recommend increasing activity, he declines PT OT Recommend caregiver distress care for his wife, recommend caregiver support groups, and for patient adult day program     Subjective:    This patient is accompanied in the office by his daughter and his wife*** who supplements the history.  Previous records as well as any outside records available were reviewed prior to todays visit. Patient was last seen on 04/10/2023 with MMSE 27/30***   Any changes in memory since last visit? .  He has some difficulty remembering new information, recent conversations sometimes he may not be able to recognize a family member.  He may not remember how to use the remote control or to change the channels.  Likes to watch sports on TV, does not like doing any brain games. repeats oneself?  Endorsed Disoriented when walking into a room?  Sometimes he may go to the wrong room in the house.***  Leaving objects?  May misplace things but not in unusual places***  Wandering behavior?  denies   Any personality changes since last visit?  The time, lose everything .  He mainly things in  different places and then he accuses someone of having done so. Any worsening depression?:  Denies.   Hallucinations or paranoia?  Denies.   Seizures? denies    Any sleep changes?  He paces at night.  He talks in his sleep has RLS.  No sleepwalking sleep apnea?   Denies.   Any hygiene concerns? Denies.  Independent of bathing and dressing?  He may put 2 different shoes on Does the patient needs help with medications?  Wife is in charge *** Who is in charge of the finances?  Wife is in charge, she is POA   *** Any changes in appetite?  denies ***   Patient have trouble swallowing? Denies.   Does the patient cook? No Any headaches?   He has a history of headaches, currently controlled. Any vision changes?*** Chronic back pain  denies   Ambulates with difficulty? Denies.  He uses a cane for stability due to right knee arthritis*** Recent falls or head injuries? Denies.     Unilateral weakness, numbness or tingling? denies   Any tremors?  Denies  *** Any anosmia?  Endorsed, chronic. Any incontinence of urine?  Endorsed has to get up at night a few times to urinate.***  Any bowel dysfunction?   Denies      Patient lives   *** Does the patient drive? No longer drives ***   Initial visit 10/18/2021 How long did patient have memory difficulties?  Patient reports that his memory issues have been present for the last 2-3 months -  however he is wife reports it has been about 2-3 years if not more .  He forgets recent conversations, and sometimes he does not answer what he is being asked.  Both short-term and long-term memory are affected. Patient lives with: Spouse  repeats oneself? endorsed, such as where we going several times.  Not so much about telling the same stories  Disoriented when walking into a room? Endorsed.he has shown difficulty finding rooms inside the house such as the bedroom or bathroom.  Sometimes he has accidents , thinking that is the bathroom and he urinates in the  hallway.   Leaving objects in unusual places? Endorsed.  He moves stuff everywhere and wife cannot find it  Ambulates  with difficulty?  Occasionally he has balance problems.  His daughter states that he begins walking, then the steps get smaller, if the distance is long, and then he is at risk of falling.  Occasionally he uses a cane but not all the time. Recent falls? 2 nights ago he fell from the bed Any head injuries? 6-7 y he had a hemorrhagic stroke.  No apparent residuals according to the family. History of seizures?   Patient denies   Wandering behavior?  Patient denies his wife reports that he just stands there . Recently he took the wrong bus. Patient drives?   Patient no longer drives    Any mood changes such irritability agitation?  Endorsed. He is more hateful that it used to be, especially over the last 2 months-his wife says Any history of depression?:  denies   Hallucinations?  Patient denies   Paranoia?  Patient denies   Patient reports that sleeps not well, roams around the house denies vivid dreams, REM behavior or sleepwalking    History of sleep apnea?  Patient denies   Any hygiene concerns?  Endorsed especially over the last 2 months. He does not want to take a shower  Independent of bathing and dressing?  Endorsed.  His wife lines up the clothing Does the patient needs help with medications?  Wife in charge  Who is in charge of the finances?  Patient is in charge but she needs a POA because it's getting worse.  They are in the process of doing so. Any changes in appetite?  He doesn't  eat like he used to, he may be forgetting to eat  Patient have trouble swallowing? Patient denies   Does the patient cook?  Patient denies   Any kitchen accidents such as leaving the stove on? He burns my food all the time, I wanna keep him out of the kitchen Any headaches?  Endorsed, occasionally in the forehead usually do to blood pressure Double vision? Patient denies   Any  focal numbness or tingling?  Patient denies   Chronic back pain Patient denies   Unilateral weakness?  Patient denies   Any tremors?  Patient denies   Any history of anosmia?  For the last 2-3 years  Any incontinence of urine?  At night he urinates 3-4  night, sometimes in the hall Any bowel dysfunction?   Patient denies   History of heavy alcohol intake?   He's been a drank all his life, liquor, but he backed down  History of heavy tobacco use?  Patient denies   Family history of dementia?   Mother  Alzheimer's disease           10/24/2021 MRI brain personally reviewed was remarkable for mild chronic small vessel ischemic disease progressive  symptoms prior MRI in 2012, repeat demonstrated chronic lacunar infarct within the right aspect of the pons, and mild generalized parenchymal atrophy.   Hospital workup 03/2023 Chest x-ray reviewed with COVID flu negative  MRI  brain 04/03/23 personally reviewed no acute finding, no evidence of acute or subacute infarct, remote infarcts seen as prior, mild chronic microvascular disease, cerebral atrophy.    Carotid duplex-80% stenosis bilaterally a TTE ordered for completeness and normal w/ lvef 60-65%, no rwma,  TSH B12 B1 and ammonia were normal recently.  PREVIOUS MEDICATIONS:   CURRENT MEDICATIONS:  Outpatient Encounter Medications as of 09/17/2023  Medication Sig   amLODipine -benazepril  (LOTREL) 10-20 MG capsule Take 1 capsule by mouth daily.   aspirin  EC 81 MG tablet Take by mouth.   atenolol  (TENORMIN ) 100 MG tablet Take 100 mg by mouth daily.   Buprenorphine  HCl-Naloxone  HCl 4-1 MG FILM Place 1 Film under the tongue 2 (two) times daily.   cloNIDine  (CATAPRES ) 0.2 MG tablet Take 1 tablet (0.2 mg total) by mouth 2 (two) times daily.   divalproex  (DEPAKOTE ) 125 MG DR tablet Take 1 tab at night , may increase to 1 tab twice a day if needed   doxazosin (CARDURA) 1 MG tablet Take 1 mg by mouth at bedtime.   esomeprazole  (NEXIUM ) 40 MG capsule  TAKE ONE CAPSULE BY MOUTH ONCE DAILY.   fluticasone  (FLONASE ) 50 MCG/ACT nasal spray PLACE 1 SPRAY INTO BOTH NOSTRILS DAILY.   memantine  (NAMENDA ) 10 MG tablet Take 1 tablet twice a day (Patient taking differently: Take 10 mg by mouth daily. Take 1 tablet twice a day)   Multiple Vitamin (MULTIVITAMIN WITH MINERALS) TABS tablet Take 1 tablet by mouth daily.   pravastatin  (PRAVACHOL ) 20 MG tablet TAKE ONE TABLET BY MOUTH ONCE DAILY FOR CHOLESTEROL (Patient taking differently: Take 20 mg by mouth daily.)   tamsulosin  (FLOMAX ) 0.4 MG CAPS capsule TAKE ONE CAPSULE BY MOUTH ONCE DAILY   No facility-administered encounter medications on file as of 09/17/2023.       04/10/2023    3:00 PM 08/21/2022   12:00 PM  MMSE - Mini Mental State Exam  Orientation to time 5 4  Orientation to Place 5 4  Registration 3 3  Attention/ Calculation 4 0  Recall 3 2  Language- name 2 objects 2 2  Language- repeat 1 1  Language- follow 3 step command 3 3  Language- read & follow direction 1 1  Write a sentence 0 1  Copy design 0 0  Total score 27 21      10/18/2021    8:00 AM  Montreal Cognitive Assessment   Visuospatial/ Executive (0/5) 0  Naming (0/3) 3  Attention: Read list of digits (0/2) 0  Attention: Read list of letters (0/1) 1  Attention: Serial 7 subtraction starting at 100 (0/3) 0  Language: Repeat phrase (0/2) 1  Language : Fluency (0/1) 0  Abstraction (0/2) 0  Delayed Recall (0/5) 0  Orientation (0/6) 5  Total 10  Adjusted Score (based on education) 11    Objective:     PHYSICAL EXAMINATION:    VITALS:  There were no vitals filed for this visit.  GEN:  The patient appears stated age and is in NAD. HEENT:  Normocephalic, atraumatic.   Neurological examination:  General: NAD, well-groomed, appears stated age. Orientation: The patient is alert. Oriented to person, place and date Cranial nerves: There is good facial symmetry.The speech is fluent and clear. No aphasia or dysarthria.  Fund  of knowledge is appropriate. Recent and remote memory are impaired. Attention and concentration are reduced. Able to name objects and repeat phrases.  Hearing is intact to conversational tone. *** Sensation: Sensation is intact to light touch throughout Motor: Strength is at least antigravity x4. DTR's 2/4 in UE/LE     Movement examination: Tone: There is normal tone in the UE/LE Abnormal movements:  no tremor.  No myoclonus.  No asterixis.   Coordination:  There is no decremation with RAM's. Normal finger to nose  Gait and Station: The patient has some difficulty arising out of a deep-seated chair without the use of the hands. The patient's stride length is good slow due to chronic right knee pain.  Gait is cautious and narrow.    Thank you for allowing us  the opportunity to participate in the care of this nice patient. Please do not hesitate to contact us  for any questions or concerns.   Total time spent on today's visit was *** minutes dedicated to this patient today, preparing to see patient, examining the patient, ordering tests and/or medications and counseling the patient, documenting clinical information in the EHR or other health record, independently interpreting results and communicating results to the patient/family, discussing treatment and goals, answering patient's questions and coordinating care.  Cc:  Rosalynn Camie CROME, MD  Camie Sevin 09/17/2023 6:29 AM

## 2023-09-18 ENCOUNTER — Encounter: Payer: Self-pay | Admitting: Physician Assistant

## 2023-09-22 DIAGNOSIS — R030 Elevated blood-pressure reading, without diagnosis of hypertension: Secondary | ICD-10-CM | POA: Diagnosis not present

## 2023-09-22 DIAGNOSIS — I10 Essential (primary) hypertension: Secondary | ICD-10-CM | POA: Diagnosis not present

## 2023-09-22 DIAGNOSIS — G8929 Other chronic pain: Secondary | ICD-10-CM | POA: Diagnosis not present

## 2023-09-22 DIAGNOSIS — M545 Low back pain, unspecified: Secondary | ICD-10-CM | POA: Diagnosis not present

## 2023-09-28 DIAGNOSIS — N184 Chronic kidney disease, stage 4 (severe): Secondary | ICD-10-CM | POA: Diagnosis not present

## 2023-10-06 ENCOUNTER — Telehealth: Payer: Self-pay | Admitting: Family Medicine

## 2023-10-06 NOTE — Telephone Encounter (Signed)
 Patients daughter called stating her dad has been doing a lot of weird things lately. She did not really mention any of these things except sleeping a lot more than normal. Told daughter Dr Rosalynn is booked up all of August and September but I could get him in with another provider. She states she would like me to send a message to Dr Rosalynn requesting a double book or a work in appointment with her. I went ahead and scheduled for tomorrow on ATC while we wait to hear back from [place]. Please advise.

## 2023-10-07 ENCOUNTER — Ambulatory Visit (INDEPENDENT_AMBULATORY_CARE_PROVIDER_SITE_OTHER): Admitting: Family Medicine

## 2023-10-07 VITALS — BP 229/122 | HR 82 | Ht 72.0 in | Wt 206.4 lb

## 2023-10-07 DIAGNOSIS — F02B18 Dementia in other diseases classified elsewhere, moderate, with other behavioral disturbance: Secondary | ICD-10-CM

## 2023-10-07 DIAGNOSIS — G301 Alzheimer's disease with late onset: Secondary | ICD-10-CM

## 2023-10-07 DIAGNOSIS — I10 Essential (primary) hypertension: Secondary | ICD-10-CM

## 2023-10-07 DIAGNOSIS — N281 Cyst of kidney, acquired: Secondary | ICD-10-CM | POA: Diagnosis not present

## 2023-10-07 DIAGNOSIS — G309 Alzheimer's disease, unspecified: Secondary | ICD-10-CM | POA: Diagnosis not present

## 2023-10-07 DIAGNOSIS — R809 Proteinuria, unspecified: Secondary | ICD-10-CM | POA: Diagnosis not present

## 2023-10-07 DIAGNOSIS — N184 Chronic kidney disease, stage 4 (severe): Secondary | ICD-10-CM | POA: Diagnosis not present

## 2023-10-07 DIAGNOSIS — I129 Hypertensive chronic kidney disease with stage 1 through stage 4 chronic kidney disease, or unspecified chronic kidney disease: Secondary | ICD-10-CM | POA: Diagnosis not present

## 2023-10-07 DIAGNOSIS — N2581 Secondary hyperparathyroidism of renal origin: Secondary | ICD-10-CM | POA: Diagnosis not present

## 2023-10-07 NOTE — Patient Instructions (Addendum)
 Please take all of your medications as prescribed  Please bring all of your medications in to your next visit on 8/20  If you have any falls, chest pain, numbness or weakness in your arms or legs, severe pain, headaches, vision changes, nausea or vomiting I recommend you be evaluated immediately

## 2023-10-07 NOTE — Telephone Encounter (Signed)
 I am not in the office until Monday, checking my EPIC box from home. I would recommend she go ahead and bring her Dad in to be seen and then we can chat next week; I will have info from the ATC office visit to help me understand what to do. See if she will do that. THANKS! Camie Mulch

## 2023-10-07 NOTE — Assessment & Plan Note (Signed)
 Recommend returning for a full medication reconciliation visit within the next week.  Primarily to check and see what dose of Depakote  and/or memantine  he is taking if any. Recommend neurology follow-up as scheduled for additional evaluation.   To some degree I think many of the concerns are within the expected behavior of the patient with dementia/Alzheimer's including sleep/wake/circadian imbalances.  We discussed return precautions such as if you were to start having falls or become agitated/aggressive in the meantime Appointment scheduled for 8/20 with Dr. Tharon

## 2023-10-07 NOTE — Progress Notes (Signed)
    SUBJECTIVE:   CHIEF COMPLAINT / HPI:   Concern for weird activities at night per daughter Ongoing for about 1.5 months, does not have symptoms every night Often in the middle of the night will wake up and start moving around the house, goes outside and stands. Doesn't go back to sleep afterwards. Does this during the day too, roams around a lot Patient reports he remembers waking up a couple of nights ago, does not remember additional events. Does not remember specifically what woke him up  Patient follows with neurology who recommended starting Depakote  a few months ago but the patient and his daughter are unsure whether or not he is taking this right now.  They are also unsure what dose of memantine  he was on.  Elevated BP Did not take home Lotrel, atenolol , clonidine  this morning.  Unsure if he has been taking these every day Denies severe headache, vision changes, numbness or weakness in extremities, chest pain or shortness of breath, abdominal pain  PERTINENT  PMH / PSH: Dementia, hypertension  OBJECTIVE:   BP (!) 229/122   Pulse 82   Ht 6' (1.829 m)   Wt 206 lb 6.4 oz (93.6 kg)   SpO2 100%   BMI 27.99 kg/m   General: NAD, pleasant, able to participate in exam Cardiac: RRR, no murmurs auscultated Respiratory: CTAB, normal WOB Abdomen: soft, non-tender, non-distended, normoactive bowel sounds Extremities: warm and well perfused, no edema or cyanosis Skin: warm and dry, no rashes noted Neuro: alert, no obvious focal deficits, speech normal Psych: Normal affect and mood  ASSESSMENT/PLAN:   Assessment & Plan Moderate late onset Alzheimer's dementia with other behavioral disturbance (HCC) Recommend returning for a full medication reconciliation visit within the next week.  Primarily to check and see what dose of Depakote  and/or memantine  he is taking if any. Recommend neurology follow-up as scheduled for additional evaluation.   To some degree I think many of the  concerns are within the expected behavior of the patient with dementia/Alzheimer's including sleep/wake/circadian imbalances.  We discussed return precautions such as if you were to start having falls or become agitated/aggressive in the meantime Appointment scheduled for 8/20 with Dr. Tharon Essential hypertension Severe uncontrolled hypertension, in the setting of medication nonadherence, did not take medications today and is unsure exactly what he has been taking every day. Elevated BP today, has had systolic BP in the 200s at multiple visits recently.  No red flags on exam or in history. Continue current medications as prescribed, patient reports he has some at home, recommend medication reconciliation visit in 1 week as scheduled prior to adding on additional agents at this time. Discussed return precautions   Payton Coward, MD [facility]

## 2023-10-07 NOTE — Assessment & Plan Note (Signed)
 Severe uncontrolled hypertension, in the setting of medication nonadherence, did not take medications today and is unsure exactly what he has been taking every day. Elevated BP today, has had systolic BP in the 200s at multiple visits recently.  No red flags on exam or in history. Continue current medications as prescribed, patient reports he has some at home, recommend medication reconciliation visit in 1 week as scheduled prior to adding on additional agents at this time. Discussed return precautions

## 2023-10-08 NOTE — Telephone Encounter (Signed)
 Called and spoke with patients daughter.   Patient was seen yesterday in clinic with [facility]. She reports he has a FU apt on 8/20 with [facility].   She reports if able, if PCP could pop her head in to look him over she would appreciate this.   Advised will forward to PCP.

## 2023-10-14 ENCOUNTER — Telehealth: Payer: Self-pay | Admitting: Family Medicine

## 2023-10-14 ENCOUNTER — Ambulatory Visit (INDEPENDENT_AMBULATORY_CARE_PROVIDER_SITE_OTHER): Admitting: Family Medicine

## 2023-10-14 VITALS — BP 130/78 | HR 63 | Ht 72.0 in | Wt 207.6 lb

## 2023-10-14 DIAGNOSIS — F02B18 Dementia in other diseases classified elsewhere, moderate, with other behavioral disturbance: Secondary | ICD-10-CM | POA: Diagnosis not present

## 2023-10-14 DIAGNOSIS — I10 Essential (primary) hypertension: Secondary | ICD-10-CM | POA: Diagnosis not present

## 2023-10-14 DIAGNOSIS — G301 Alzheimer's disease with late onset: Secondary | ICD-10-CM | POA: Diagnosis not present

## 2023-10-14 NOTE — Assessment & Plan Note (Signed)
 Stable today.  He is following up with neurology closely.  He unfortunately missed a recent appointment for an EEG; I will send a message to his neurologist to see if they can get him in sooner than later for this EEG.

## 2023-10-14 NOTE — Assessment & Plan Note (Signed)
 Blood pressure controlled today on amlodipine -benazepril .  Unfortunately, him and his daughter are unsure exactly other medicines he is taking.  They will activate MyChart and send me a message of his entire medication list for further medication reconciliation.  Follow-up in 1 month.

## 2023-10-14 NOTE — Patient Instructions (Signed)
 Keep up the great work with the blood pressure medicines. Be sure to activate mychart and send me a list of ALL medicines that he is taking. I will send a message to his neurologist to see if they can get him in for EEG sooner.

## 2023-10-14 NOTE — Progress Notes (Signed)
    SUBJECTIVE:   CHIEF COMPLAINT / HPI:   BP follow up Has been taking Lotrel daily as prescribed.  Medication reconciliation Unfortunately still not fully known which medications he is taking other than his BP medication.  OBJECTIVE:   BP 130/78   Pulse 63   Ht 6' (1.829 m)   Wt 207 lb 9.6 oz (94.2 kg)   SpO2 99%   BMI 28.16 kg/m   General: Alert, in NAD HEENT: NCAT, EOM grossly normal, midline nasal septum Cardiac: RRR, no m/r/g appreciated Respiratory: CTAB, breathing and speaking comfortably on RA Abdominal: Soft, nontender, nondistended, normoactive bowel sounds Extremities: Moves all extremities grossly equally Neurological: No gross focal deficit Psychiatric: Appropriate mood and affect   ASSESSMENT/PLAN:   Assessment & Plan Essential hypertension Blood pressure controlled today on amlodipine -benazepril .  Unfortunately, him and his daughter are unsure exactly other medicines he is taking.  They will activate MyChart and send me a message of his entire medication list for further medication reconciliation.  Follow-up in 1 month. Moderate late onset Alzheimer's dementia with other behavioral disturbance (HCC) Stable today.  He is following up with neurology closely.  He unfortunately missed a recent appointment for an EEG; I will send a message to his neurologist to see if they can get him in sooner than later for this EEG.   Stuart Redo, MD [facility]

## 2023-10-14 NOTE — Telephone Encounter (Signed)
 Error

## 2023-10-15 ENCOUNTER — Ambulatory Visit: Payer: 59 | Admitting: Physician Assistant

## 2023-10-15 ENCOUNTER — Ambulatory Visit (INDEPENDENT_AMBULATORY_CARE_PROVIDER_SITE_OTHER): Admitting: Neurology

## 2023-10-15 DIAGNOSIS — R404 Transient alteration of awareness: Secondary | ICD-10-CM | POA: Diagnosis not present

## 2023-10-15 NOTE — Progress Notes (Unsigned)
 EEG complete and ready for review.

## 2023-10-16 NOTE — Procedures (Signed)
 ELECTROENCEPHALOGRAM REPORT  Date of Study: 10/15/2023  Patient's Name: Edward Morgan MRN: [id number] Date of Birth: 05-25-1950  Referring Provider: Camie Sevin, PA-C  Clinical History: This is a 73 year old man with episode of confusion in 03/2023. EEG for classification.  CNS Active Medications: Depakote , Memantine   Technical Summary: A multichannel digital 1-hour EEG recording measured by the international 10-20 system with electrodes applied with paste and impedances below 5000 ohms performed in our laboratory with EKG monitoring in an awake and asleep patient.  Hyperventilation was not performed. Photic stimulation was performed.  The digital EEG was referentially recorded, reformatted, and digitally filtered in a variety of bipolar and referential montages for optimal display.    Description: The patient is awake and asleep during the recording.  During maximal wakefulness, there is a symmetric, medium voltage 8 Hz posterior dominant rhythm that attenuates with eye opening.  The record is symmetric.  During drowsiness and sleep, there is an increase in theta slowing of the background.  Vertex waves and symmetric sleep spindles were seen. Photic stimulation did not elicit any abnormalities.  There were no epileptiform discharges or electrographic seizures seen.    EKG lead was unremarkable.  Impression: This 1-hour awake and asleep EEG is normal.    Clinical Correlation: A normal EEG does not exclude a clinical diagnosis of epilepsy.  If further clinical questions remain, prolonged EEG may be helpful.  Clinical correlation is advised.   Darice Shivers, M.D.

## 2023-10-19 ENCOUNTER — Ambulatory Visit: Payer: Self-pay | Admitting: Neurology

## 2023-10-22 ENCOUNTER — Telehealth: Payer: Self-pay | Admitting: Family Medicine

## 2023-10-22 NOTE — Telephone Encounter (Signed)
 Patient's daughter called asking about the status of paperwork that was dropped off during his appt on 10/14/23. Asks to please call when it is ready

## 2023-10-30 ENCOUNTER — Encounter: Payer: Self-pay | Admitting: Family Medicine

## 2023-10-30 ENCOUNTER — Other Ambulatory Visit: Payer: Self-pay | Admitting: Family Medicine

## 2023-10-30 MED ORDER — MEMANTINE HCL 10 MG PO TABS: ORAL_TABLET | ORAL | Status: DC

## 2023-10-30 MED ORDER — PRAVASTATIN SODIUM 20 MG PO TABS: 20.0000 mg | ORAL_TABLET | Freq: Every day | ORAL | Status: DC

## 2023-11-02 ENCOUNTER — Ambulatory Visit (INDEPENDENT_AMBULATORY_CARE_PROVIDER_SITE_OTHER): Admitting: Family Medicine

## 2023-11-02 VITALS — BP 137/63 | Temp 97.5°F | Wt 217.8 lb

## 2023-11-02 DIAGNOSIS — Z0000 Encounter for general adult medical examination without abnormal findings: Secondary | ICD-10-CM | POA: Diagnosis not present

## 2023-11-02 DIAGNOSIS — Z1211 Encounter for screening for malignant neoplasm of colon: Secondary | ICD-10-CM | POA: Diagnosis not present

## 2023-11-02 DIAGNOSIS — I10 Essential (primary) hypertension: Secondary | ICD-10-CM | POA: Diagnosis not present

## 2023-11-02 NOTE — Patient Instructions (Signed)
 Be sure to activate MyChart and let me know which medications he is taking.  I have sent a Cologuard sample for colon cancer screening to your daughter's home.  You received the flu shot today.  Here is the number to your podiatrist: [facility] Address: [address] [place], [place], [place] [address] Phone: [phone]

## 2023-11-02 NOTE — Assessment & Plan Note (Addendum)
 Controlled for age. Unfortunately unable to bring medications today. We set up his MyChart for his daughter to help send me all of his medications at home.

## 2023-11-02 NOTE — Progress Notes (Signed)
    SUBJECTIVE:   CHIEF COMPLAINT / HPI:   HTN Controlled at last visit though unsure of which medications he was taking. He is here for medication reconciliation.  PERTINENT  PMH / PSH: Alzheimer's dementia, CKD 4, HFpEF (G1DD)  OBJECTIVE:   BP 137/63   Temp (!) 97.5 F (36.4 C)   Wt 217 lb 13 oz (98.8 kg)   SpO2 94%   BMI 29.54 kg/m   General: Alert and oriented, in NAD Skin: Warm, dry, and intact without lesions HEENT: NCAT, EOM grossly normal, midline nasal septum Cardiac: Regular rate Respiratory: Breathing and speaking comfortably on RA Abdominal: Soft, nontender, nondistended, normoactive bowel sounds Extremities: Moves all extremities grossly equally Neurological: No gross focal deficit Psychiatric: Appropriate mood and affect   ASSESSMENT/PLAN:   Assessment & Plan Essential hypertension Controlled for age. Unfortunately unable to bring medications today. We set up his MyChart for his daughter to help send me all of his medications at home. Healthcare maintenance After shared decision making, he elected for cologuard; this has been ordered and sent to his daughter's address (changed in chart for this). He is scheduled for a MAW visit. Unable to receive flu shot today due to time constraints, but he will come back for this.   Stuart Redo, MD [facility]

## 2023-11-09 ENCOUNTER — Encounter: Payer: Self-pay | Admitting: Family Medicine

## 2023-11-16 ENCOUNTER — Ambulatory Visit (INDEPENDENT_AMBULATORY_CARE_PROVIDER_SITE_OTHER): Admitting: Physician Assistant

## 2023-11-16 ENCOUNTER — Encounter: Payer: Self-pay | Admitting: Physician Assistant

## 2023-11-16 VITALS — BP 184/76 | HR 65 | Resp 20 | Ht 72.0 in | Wt 212.0 lb

## 2023-11-16 DIAGNOSIS — F0390 Unspecified dementia without behavioral disturbance: Secondary | ICD-10-CM

## 2023-11-16 DIAGNOSIS — R413 Other amnesia: Secondary | ICD-10-CM | POA: Diagnosis not present

## 2023-11-16 MED ORDER — MEMANTINE HCL 10 MG PO TABS: ORAL_TABLET | ORAL | 3 refills | Status: DC

## 2023-11-16 NOTE — Patient Instructions (Signed)
 It was a pleasure to see you today at our office.   Recommendations:  Follow up in 6  months  Continue memantine  10 mg twice a day  Increase activities and socialization     For guidance regarding [facility] and if placement were needed at the facility, contact Nat Hock, Social Worker tel: [phone]  If you have any severe symptoms of a stroke, or other severe issues such as confusion,severe chills or fever, etc call 911 or go to the ER as you may need to be evaluated further      RECOMMENDATIONS FOR ALL PATIENTS WITH MEMORY PROBLEMS: 1. Continue to exercise (Recommend 30 minutes of walking everyday, or 3 hours every week) 2. Increase social interactions - continue going to [place] and enjoy social gatherings with friends and family 3. Eat healthy, avoid fried foods and eat more fruits and vegetables 4. Maintain adequate blood pressure, blood sugar, and blood cholesterol level. Reducing the risk of stroke and cardiovascular disease also helps promoting better memory. 5. Avoid stressful situations. Live a simple life and avoid aggravations. Organize your time and prepare for the next day in anticipation. 6. Sleep well, avoid any interruptions of sleep and avoid any distractions in the bedroom that may interfere with adequate sleep quality 7. Avoid sugar, avoid sweets as there is a strong link between excessive sugar intake, diabetes, and cognitive impairment We discussed the Mediterranean diet, which has been shown to help patients reduce the risk of progressive memory disorders and reduces cardiovascular risk. This includes eating fish, eat fruits and green leafy vegetables, nuts like almonds and hazelnuts, walnuts, and also use olive oil. Avoid fast foods and fried foods as much as possible. Avoid sweets and sugar as sugar use has been linked to worsening of memory function.  There is always a concern of gradual progression of memory problems. If this is the  case, then we may need to adjust level of care according to patient needs. Support, both to the patient and caregiver, should then be put into place.    The Alzheimer's Association is here all day, every day for people facing Alzheimer's disease through our free 24/7 Helpline: [phone]. The Helpline provides reliable information and support to all those who need assistance, such as individuals living with memory loss, Alzheimer's or other dementia, caregivers, health care professionals and the public.  Our highly trained and knowledgeable staff can help you with: Understanding memory loss, dementia and Alzheimer's  Medications and other treatment options  General information about aging and brain health  Skills to provide quality care and to find the best care from professionals  Legal, financial and living-arrangement decisions Our Helpline also features: Confidential care consultation provided by master's level clinicians who can help with decision-making support, crisis assistance and education on issues families face every day  Help in a caller's preferred language using our translation service that features more than 200 languages and dialects  Referrals to local community programs, services and ongoing support     FALL PRECAUTIONS: Be cautious when walking. Scan the area for obstacles that may increase the risk of trips and falls. When getting up in the mornings, sit up at the edge of the bed for a few minutes before getting out of bed. Consider elevating the bed at the head end to avoid drop of blood pressure when getting up. Walk always in a well-lit room (use night lights in the walls). Avoid area rugs or power cords from appliances in the  middle of the walkways. Use a walker or a cane if necessary and consider physical therapy for balance exercise. Get your eyesight checked regularly.  FINANCIAL OVERSIGHT: Supervision, especially oversight when making financial decisions or  transactions is also recommended.  HOME SAFETY: Consider the safety of the kitchen when operating appliances like stoves, microwave oven, and blender. Consider having supervision and share cooking responsibilities until no longer able to participate in those. Accidents with firearms and other hazards in the house should be identified and addressed as well.   ABILITY TO BE LEFT ALONE: If patient is unable to contact 911 operator, consider using LifeLine, or when the need is there, arrange for someone to stay with patients. Smoking is a fire hazard, consider supervision or cessation. Risk of wandering should be assessed by caregiver and if detected at any point, supervision and safe proof recommendations should be instituted.  MEDICATION SUPERVISION: Inability to self-administer medication needs to be constantly addressed. Implement a mechanism to ensure safe administration of the medications.   DRIVING: Regarding driving, in patients with progressive memory problems, driving will be impaired. We advise to have someone else do the driving if trouble finding directions or if minor accidents are reported. Independent driving assessment is available to determine safety of driving.   If you are interested in the driving assessment, you can contact the following:  [employer] in [place] [phone]  [facility] [phone]  [facility] [phone] or [phone]      Mediterranean Diet A Mediterranean diet refers to food and lifestyle choices that are based on the traditions of countries located on the [employer]. This way of eating has been shown to help prevent certain conditions and improve outcomes for people who have chronic diseases, like kidney disease and heart disease. What are tips for following this plan? Lifestyle  Cook and eat meals together with your family, when possible. Drink enough fluid  to keep your urine clear or pale yellow. Be physically active every day. This includes: Aerobic exercise like running or swimming. Leisure activities like gardening, walking, or housework. Get 7-8 hours of sleep each night. If recommended by your health care provider, drink red wine in moderation. This means 1 glass a day for nonpregnant women and 2 glasses a day for men. A glass of wine equals 5 oz (150 mL). Reading food labels  Check the serving size of packaged foods. For foods such as rice and pasta, the serving size refers to the amount of cooked product, not dry. Check the total fat in packaged foods. Avoid foods that have saturated fat or trans fats. Check the ingredients list for added sugars, such as corn syrup. Shopping  At the grocery store, buy most of your food from the areas near the walls of the store. This includes: Fresh fruits and vegetables (produce). Grains, beans, nuts, and seeds. Some of these may be available in unpackaged forms or large amounts (in bulk). Fresh seafood. Poultry and eggs. Low-fat dairy products. Buy whole ingredients instead of prepackaged foods. Buy fresh fruits and vegetables in-season from local farmers markets. Buy frozen fruits and vegetables in resealable bags. If you do not have access to quality fresh seafood, buy precooked frozen shrimp or canned fish, such as tuna, salmon, or sardines. Buy small amounts of raw or cooked vegetables, salads, or olives from the deli or salad bar at your store. Stock your pantry so you always have certain foods on hand, such as olive  oil, canned tuna, canned tomatoes, rice, pasta, and beans. Cooking  Cook foods with extra-virgin olive oil instead of using butter or other vegetable oils. Have meat as a side dish, and have vegetables or grains as your main dish. This means having meat in small portions or adding small amounts of meat to foods like pasta or stew. Use beans or vegetables instead of meat in common  dishes like chili or lasagna. Experiment with different cooking methods. Try roasting or broiling vegetables instead of steaming or sauteing them. Add frozen vegetables to soups, stews, pasta, or rice. Add nuts or seeds for added healthy fat at each meal. You can add these to yogurt, salads, or vegetable dishes. Marinate fish or vegetables using olive oil, lemon juice, garlic, and fresh herbs. Meal planning  Plan to eat 1 vegetarian meal one day each week. Try to work up to 2 vegetarian meals, if possible. Eat seafood 2 or more times a week. Have healthy snacks readily available, such as: Vegetable sticks with hummus. Greek yogurt. Fruit and nut trail mix. Eat balanced meals throughout the week. This includes: Fruit: 2-3 servings a day Vegetables: 4-5 servings a day Low-fat dairy: 2 servings a day Fish, poultry, or lean meat: 1 serving a day Beans and legumes: 2 or more servings a week Nuts and seeds: 1-2 servings a day Whole grains: 6-8 servings a day Extra-virgin olive oil: 3-4 servings a day Limit red meat and sweets to only a few servings a month What are my food choices? Mediterranean diet Recommended Grains: Whole-grain pasta. Brown rice. Bulgar wheat. Polenta. Couscous. Whole-wheat bread. [place] Madeira. Vegetables: Artichokes. Beets. Broccoli. Cabbage. Carrots. Eggplant. Green beans. Chard. Kale. Spinach. Onions. Leeks. Peas. Squash. Tomatoes. Peppers. Radishes. Fruits: Apples. Apricots. Avocado. Berries. Bananas. Cherries. Dates. Figs. Grapes. Lemons. Melon. Oranges. Peaches. Plums. Pomegranate. Meats and other protein foods: Beans. Almonds. Sunflower seeds. Pine nuts. Peanuts. Cod. Salmon. Scallops. Shrimp. Tuna. Tilapia. Clams. Oysters. Eggs. Dairy: Low-fat milk. Cheese. Greek yogurt. Beverages: Water . Red wine. Herbal tea. Fats and oils: Extra virgin olive oil. Avocado oil. Grape seed oil. Sweets and desserts: [place] yogurt with honey. Baked apples. Poached pears. Trail  mix. Seasoning and other foods: Basil. Cilantro. Coriander. Cumin. Mint. Parsley. Sage. Rosemary. Tarragon. Garlic. Oregano. Thyme. Pepper. Balsalmic vinegar. Tahini. Hummus. Tomato sauce. Olives. Mushrooms. Limit these Grains: Prepackaged pasta or rice dishes. Prepackaged cereal with added sugar. Vegetables: Deep fried potatoes (french fries). Fruits: Fruit canned in syrup. Meats and other protein foods: Beef. Pork. Lamb. Poultry with skin. Hot dogs. [place]. Dairy: Ice cream. Sour cream. Whole milk. Beverages: Juice. Sugar-sweetened soft drinks. Beer. Liquor and spirits. Fats and oils: Butter. Canola oil. Vegetable oil. Beef fat (tallow). Lard. Sweets and desserts: Cookies. Cakes. Pies. Candy. Seasoning and other foods: Mayonnaise. Premade sauces and marinades. The items listed may not be a complete list. Talk with your dietitian about what dietary choices are right for you. Summary The Mediterranean diet includes both food and lifestyle choices. Eat a variety of fresh fruits and vegetables, beans, nuts, seeds, and whole grains. Limit the amount of red meat and sweets that you eat. Talk with your health care provider about whether it is safe for you to drink red wine in moderation. This means 1 glass a day for nonpregnant women and 2 glasses a day for men. A glass of wine equals 5 oz (150 mL). This information is not intended to replace advice given to you by your health care provider. Make sure you discuss any questions  you have with your health care provider. Document Released: 10/04/2015 Document Revised: 11/06/2015 Document Reviewed: 10/04/2015 Elsevier Interactive Patient Education  2017 [employer].   We have sent a referral to [facility] for your MRI and they will call you directly to schedule your appointment. They are located at [address] [facility]. If you need to contact them directly please call [phone].  Your provider has requested that you have labwork completed  today. Please go to [facility] ([id number]) on the second floor of this building before leaving the office today. You do not need to check in. If you are not called within 15 minutes please check with the front desk.

## 2023-11-16 NOTE — Progress Notes (Signed)
 Assessment/Plan:   Dementia likely due to Alzheimer's disease  Edward Morgan is a delightful 73 y.o. RH male with a history of hypertension, hyperlipidemia, CKD4 history of CVA, GERD, history of treated hep C, and a history of dementia likely due to Alzheimer's disease seen today in follow up for memory loss. Patient is currently on memantine  10 mg daily (unable to tolerate night dose due to nightmares and poor sleep).  Memory is stable, with MMSE today 27/30.  Patient is able to participate on ADLsand to drive without significant difficulties.  Mood is stable    Follow up in 6 months. Continue memantine  10 mg twice a day, side effects discussed Increase socialization, consider [facility] versus senior center versus attending the [employer] program for social and physical stimulation Recommend good control of her cardiovascular risk factors, patient informed of elevated BP Continue to control mood as per PCP Recommend increasing activity level.  Recommend caregiver support group for his wife to help her cope with his memory changes.      Subjective:    This patient is accompanied in the office by daughter who supplements the history.  Previous records as well as any outside records available were reviewed prior to todays visit. Patient was last seen on 04/2023 with MMSE 27/30   Any changes in memory since last visit? It is worse daughter says. He likes to watch TV, does not like doing any brain games.  His daughter reports that he does not know how to shut the control off or to change the channels. repeats oneself?  Endorsed, more often Disoriented when walking into a room? Denies sometimes he may go tot he room in the house.       Leaving objects?  May misplace things and blames others for have taken them.  Wandering behavior? He steps out and just stands there does not walk away  Any personality changes since last visit?  Denies.  Sometimes he has attitudes Any  worsening depression?:  Denies.   Hallucinations or paranoia?  Denies.   Seizures? Denies, Latest EEG taken in 09/2023 for completion workup after an episode of TAA, was negative    Any sleep changes? Sleeps well. Sometimes he wakes up and he thinks he heard the doorbell and it may have been a dream.  Denies REM behavior or sleepwalking. As before, he talks in his sleep.  Has restless legs.   Sleep apnea?   Denies.   Any hygiene concerns? Denies.  Independent of bathing and dressing?sometimes he needs help.  Put on 5 shirts and in another time he put clothes as underwear -endorses Does the patient needs help with medications? Wife  is in charge  Who is in charge of the finances? Wife  is in charge    Any changes in appetite?  Some days he eats a lot and some times he does not, depending on the food      Patient have trouble swallowing? Denies.   Does the patient cook? No Any headaches?   Denies.   Any vision changes?denies Chronic back pain  denies   Ambulates with difficulty? Denies. He does not ambulate much during a day, decreased level of activity, declined PT in the past. Uses a cane due to R knee arthritis.   Recent falls or head injuries? 2 weeks ago he fell in the bathtub and hit his behind, no head injury or LOC     Unilateral weakness, numbness or tingling? denies   Any  tremors?  Denies    Any anosmia? Endorsed, for a long time.   Any incontinence of urine?  Endorsed, especially at night , does not wear diapers he waits too long   Any bowel dysfunction?   Denies      Patient lives with his  daughter   Does the patient drive? No longer drives    Initial visit 10/18/2021 How long did patient have memory difficulties?  Patient reports that his memory issues have been present for the last 2-3 months -however he is wife reports it has been about 2-3 years if not more .  He forgets recent conversations, and sometimes he does not answer what he is being asked.  Both short-term  and long-term memory are affected. Patient lives with: Spouse  repeats oneself? endorsed, such as where we going several times.  Not so much about telling the same stories  Disoriented when walking into a room? Endorsed.he has shown difficulty finding rooms inside the house such as the bedroom or bathroom.  Sometimes he has accidents , thinking that is the bathroom and he urinates in the hallway.   Leaving objects in unusual places? Endorsed.  He moves stuff everywhere and wife cannot find it  Ambulates  with difficulty?  Occasionally he has balance problems.  His daughter states that he begins walking, then the steps get smaller, if the distance is long, and then he is at risk of falling.  Occasionally he uses a cane but not all the time. Recent falls? 2 nights ago he fell from the bed Any head injuries? 6-7 y he had a hemorrhagic stroke.  No apparent residuals according to the family. History of seizures?   Patient denies   Wandering behavior?  Patient denies his wife reports that he just stands there . Recently he took the wrong bus. Patient drives?   Patient no longer drives    Any mood changes such irritability agitation?  Endorsed. He is more hateful that it used to be, especially over the last 2 months-his wife says Any history of depression?:  denies   Hallucinations?  Patient denies   Paranoia?  Patient denies   Patient reports that sleeps not well, roams around the house denies vivid dreams, REM behavior or sleepwalking    History of sleep apnea?  Patient denies   Any hygiene concerns?  Endorsed especially over the last 2 months. He does not want to take a shower  Independent of bathing and dressing?  Endorsed.  His wife lines up the clothing Does the patient needs help with medications?  Wife in charge  Who is in charge of the finances?  Patient is in charge but she needs a POA because it's getting worse.  They are in the process of doing so. Any changes in appetite?  He  doesn't  eat like he used to, he may be forgetting to eat  Patient have trouble swallowing? Patient denies   Does the patient cook?  Patient denies   Any kitchen accidents such as leaving the stove on? He burns my food all the time, I wanna keep him out of the kitchen Any headaches?  Endorsed, occasionally in the forehead usually do to blood pressure Double vision? Patient denies   Any focal numbness or tingling?  Patient denies   Chronic back pain Patient denies   Unilateral weakness?  Patient denies   Any tremors?  Patient denies   Any history of anosmia?  For the last 2-3 years  Any  incontinence of urine?  At night he urinates 3-4  night, sometimes in the hall Any bowel dysfunction?   Patient denies   History of heavy alcohol intake?   He's been a drank all his life, liquor, but he backed down  History of heavy tobacco use?  Patient denies   Family history of dementia?   Mother  Alzheimer's disease          10/24/2021 MRI brain personally reviewed was remarkable for mild chronic small vessel ischemic disease progressive symptoms prior MRI in 2012, repeat demonstrated chronic lacunar infarct within the right aspect of the pons, and mild generalized parenchymal atrophy.   Hospital workup 03/2023 Chest x-ray reviewed with COVID flu negative  MRI  brain 04/03/23 personally reviewed no acute finding, no evidence of acute or subacute infarct, remote infarcts seen as prior, mild chronic microvascular disease, cerebral atrophy.     Carotid duplex-80% stenosis bilaterally a  TTE ordered for completeness and normal w/ lvef 60-65%, no rwma,   TSH B12 B1 and ammonia were normal recently.  EEG 09/2023 negative for seizures   PREVIOUS MEDICATIONS:   CURRENT MEDICATIONS:  Outpatient Encounter Medications as of 11/16/2023  Medication Sig   amLODipine -benazepril  (LOTREL) 10-20 MG capsule Take 1 capsule by mouth daily.   aspirin  EC 81 MG tablet Take by mouth.   atenolol  (TENORMIN ) 100 MG  tablet Take 100 mg by mouth daily.   Buprenorphine  HCl-Naloxone  HCl 4-1 MG FILM Place 1 Film under the tongue 2 (two) times daily.   cloNIDine  (CATAPRES ) 0.2 MG tablet Take 1 tablet (0.2 mg total) by mouth 2 (two) times daily.   divalproex  (DEPAKOTE ) 125 MG DR tablet Take 1 tab at night , may increase to 1 tab twice a day if needed   doxazosin (CARDURA) 1 MG tablet Take 1 mg by mouth at bedtime.   esomeprazole  (NEXIUM ) 40 MG capsule TAKE ONE CAPSULE BY MOUTH ONCE DAILY.   fluticasone  (FLONASE ) 50 MCG/ACT nasal spray PLACE 1 SPRAY INTO BOTH NOSTRILS DAILY.   furosemide  (LASIX ) 80 MG tablet Take 80 mg by mouth daily.   lisinopril  (ZESTRIL ) 20 MG tablet Take 20 mg by mouth daily.   Multiple Vitamin (MULTIVITAMIN WITH MINERALS) TABS tablet Take 1 tablet by mouth daily.   pravastatin  (PRAVACHOL ) 20 MG tablet Take 1 tablet (20 mg total) by mouth daily.   tamsulosin  (FLOMAX ) 0.4 MG CAPS capsule TAKE ONE CAPSULE BY MOUTH ONCE DAILY   [DISCONTINUED] memantine  (NAMENDA ) 10 MG tablet Take 1 tablet  by mouth daily   memantine  (NAMENDA ) 10 MG tablet Take 1 tablet  by mouth twice a day   No facility-administered encounter medications on file as of 11/16/2023.       11/17/2023    7:00 AM 04/10/2023    3:00 PM 08/21/2022   12:00 PM  MMSE - Mini Mental State Exam  Orientation to time 4 5 4   Orientation to Place 5 5 4   Registration 3 3 3   Attention/ Calculation 5 4 0  Recall 2 3 2   Language- name 2 objects 2 2 2   Language- repeat 1 1 1   Language- follow 3 step command 3 3 3   Language- read & follow direction 1 1 1   Write a sentence 1 0 1  Copy design 0 0 0  Total score 27 27 21       10/18/2021    8:00 AM  Montreal Cognitive Assessment   Visuospatial/ Executive (0/5) 0  Naming (0/3) 3  Attention: Read list  of digits (0/2) 0  Attention: Read list of letters (0/1) 1  Attention: Serial 7 subtraction starting at 100 (0/3) 0  Language: Repeat phrase (0/2) 1  Language : Fluency (0/1) 0  Abstraction  (0/2) 0  Delayed Recall (0/5) 0  Orientation (0/6) 5  Total 10  Adjusted Score (based on education) 11    Objective:     PHYSICAL EXAMINATION:    VITALS:   Vitals:   11/16/23 0825  BP: (!) 184/76  Pulse: 65  Resp: 20  SpO2: 98%  Weight: 212 lb (96.2 kg)  Height: 6' (1.829 m)    GEN:  The patient appears stated age and is in NAD. HEENT:  Normocephalic, atraumatic.   Neurological examination:  General: NAD, well-groomed, appears stated age. Orientation: The patient is alert. Oriented to person, place and date Cranial nerves: There is good facial symmetry.The speech is fluent and clear. No aphasia or dysarthria. Fund of knowledge is appropriate. Recent and remote memory are impaired. Attention and concentration are reduced. Able to name objects and repeat phrases.  Hearing is intact to conversational tone.   Sensation: Sensation is intact to light touch throughout Motor: Strength is at least antigravity x4. DTR's 2/4 in UE/LE     Movement examination: Tone: There is normal tone in the UE/LE Abnormal movements:  no tremor.  No myoclonus.  No asterixis.   Coordination:  There is no decremation with RAM's. Normal finger to nose  Gait and Station: The patient has some difficulty arising out of a deep-seated chair without the use of the hands. The patient's stride length is good, but slow attributed to R knee pain.  Gait is cautious and narrow.    Thank you for allowing us  the opportunity to participate in the care of this nice patient. Please do not hesitate to contact us  for any questions or concerns.   Total time spent on today's visit was 30 minutes dedicated to this patient today, preparing to see patient, examining the patient, ordering tests and/or medications and counseling the patient, documenting clinical information in the EHR or other health record, independently interpreting results and communicating results to the patient/family, discussing treatment and goals,  answering patient's questions and coordinating care.  Cc:  Rosalynn Camie CROME, MD  Camie Sevin 11/17/2023 7:34 AM

## 2023-11-26 ENCOUNTER — Other Ambulatory Visit: Payer: Self-pay | Admitting: Family Medicine

## 2023-11-27 ENCOUNTER — Encounter: Payer: Self-pay | Admitting: Family Medicine

## 2023-11-30 ENCOUNTER — Telehealth: Payer: Self-pay | Admitting: Pharmacist

## 2023-11-30 ENCOUNTER — Encounter: Payer: Self-pay | Admitting: Podiatry

## 2023-11-30 ENCOUNTER — Ambulatory Visit (INDEPENDENT_AMBULATORY_CARE_PROVIDER_SITE_OTHER): Admitting: Podiatry

## 2023-11-30 DIAGNOSIS — M79674 Pain in right toe(s): Secondary | ICD-10-CM

## 2023-11-30 DIAGNOSIS — B351 Tinea unguium: Secondary | ICD-10-CM

## 2023-11-30 DIAGNOSIS — M79675 Pain in left toe(s): Secondary | ICD-10-CM

## 2023-11-30 DIAGNOSIS — N184 Chronic kidney disease, stage 4 (severe): Secondary | ICD-10-CM | POA: Diagnosis not present

## 2023-11-30 NOTE — Telephone Encounter (Signed)
 Attempted to contact patient for follow-up of request to be seen in Rx clinic for medication review.  Left HIPAA compliant voice mail requesting call back to direct phone: [phone]  Total time with patient call and documentation of interaction: 9 minutes.

## 2023-11-30 NOTE — Progress Notes (Signed)
 This patient returns to my office for at risk foot care.  This patient requires this care by a professional since this patient will be at risk due to having dementia, right foot drop and CKD.  This patient is unable to cut nails himself since the patient cannot reach his nails.These nails are painful walking and wearing shoes.  This patient presents for at risk foot care today.  General Appearance  Alert, conversant and in no acute stress.  Vascular  Dorsalis pedis and posterior tibial  pulses are palpable  bilaterally.  Capillary return is within normal limits  bilaterally. Temperature is within normal limits  bilaterally.  Neurologic  Senn-Weinstein monofilament wire test within normal limits  bilaterally. Muscle power within normal limits bilaterally.  Nails Thick disfigured discolored nails with subungual debris  from hallux to fifth toes bilaterally. No evidence of bacterial infection or drainage bilaterally.  Orthopedic  No limitations of motion  feet .  No crepitus or effusions noted.  HAV  B/L.  Skin  normotropic skin with no porokeratosis noted bilaterally.  No signs of infections or ulcers noted.     Onychomycosis  Pain in right toes  Pain in left toes  Consent was obtained for treatment procedures.   Mechanical debridement of nails 1-5  bilaterally performed with a nail nipper.  Filed with dremel without incident.    Return office visit     4  months                 Told patient to return for periodic foot care and evaluation due to potential at risk complications.   Cordella Bold DPM

## 2023-12-09 ENCOUNTER — Encounter: Payer: Self-pay | Admitting: Family Medicine

## 2023-12-09 ENCOUNTER — Ambulatory Visit: Admitting: Family Medicine

## 2023-12-09 VITALS — BP 113/73 | HR 57 | Temp 98.7°F | Wt 210.8 lb

## 2023-12-09 DIAGNOSIS — F0390 Unspecified dementia without behavioral disturbance: Secondary | ICD-10-CM

## 2023-12-09 DIAGNOSIS — I10 Essential (primary) hypertension: Secondary | ICD-10-CM

## 2023-12-09 DIAGNOSIS — Z23 Encounter for immunization: Secondary | ICD-10-CM

## 2023-12-09 NOTE — Progress Notes (Unsigned)
   Discussed the use of AI scribe software for clinical note transcription with the patient, who gave verbal consent to proceed.  History of Present Illness   Edward Morgan is a 73 year old male who presents for medication management and follow-up.  Medication management difficulties - Difficulty identifying medications due to fading labels on bottles - Caregiver attempts to manage by taking pictures of labels, but labels remain illegible - Takes nine pills in the morning and three in the evening, excluding a multivitamin and nose spray - Buprenorphine  is no longer part of his regimen - Current medications include a multivitamin, a nose spray, and aspirin   Adverse effects of medications - Dry mouth and cough attributed to medication use - Daytime sleepiness associated with age and medication use  Neurological symptoms - No headaches - Confusion related to medication regimen  Renal follow-up - Continues to follow up with nephrology - Last nephrology visit occurred the previous month        PERTINENT  PMH / PSH: I have reviewed the patient's medications, allergies, past medical and surgical history, smoking status.  Pertinent findings that relate to today's visit / issues include:   Physical Exam   NECK: No carotid bruit. CHEST: Clear to auscultation bilaterally. CARDIOVASCULAR: Normal heart rate and rhythm.         Assessment and Plan    Hypertension Medication adherence confirmed.  Medication management Complicated by faded labels. Current regimen: nine pills in the morning, three in the evening. Buprenorphine  discontinued. Dry mouth and cough possible side effects. - Provide list of current medications. - Consider switching pharmacy to [facility] for easier management. - Coordinate with pharmacy to ensure correct medication dispensing. - Refill medications as needed after confirming with pharmacy.  Adult Wellness Visit Routine visit, no acute issues. Heart and lung  examination normal. No lab work required.     total time spent including: patient interview, examination, review of chart and history, development of differential diagnoses, decision about testing, discussion of treatment and management options with patient and coordination of care.

## 2023-12-09 NOTE — Patient Instructions (Signed)
 We went over your medication list today and I have discontinued the buprenorphine  as you have stopped that.  I think you are on the same page with medications.  We gave you your flu shot today.  We will get lab work next time I see you in about 6 months.  Please let me know if there is something that comes up between now and then.  If I do not see you again have a happy holiday season!

## 2023-12-11 ENCOUNTER — Telehealth: Payer: Self-pay

## 2023-12-11 NOTE — Telephone Encounter (Signed)
 [facility] calls nurse line for clarification and refills.   He reports he is updating the patients bubble packs. He reports he needs actual prescriptions for nexium , aspirin  and multivitamin. I have pended these for you.  He reports [facility] increased his amlodipine -benazepril  to 10-40mg  in August. He reports he wants to make PCP aware of this, as we have him on 10-20mg .  Advised will forward to PCP.

## 2023-12-15 MED ORDER — ADULT MULTIVITAMIN W/MINERALS CH: 1.0000 | ORAL_TABLET | Freq: Every day | ORAL | 3 refills | Status: AC

## 2023-12-15 MED ORDER — ESOMEPRAZOLE MAGNESIUM 40 MG PO CPDR: 40.0000 mg | DELAYED_RELEASE_CAPSULE | Freq: Every day | ORAL | 3 refills | Status: AC

## 2023-12-15 MED ORDER — AMLODIPINE BESY-BENAZEPRIL HCL 10-40 MG PO CAPS: 1.0000 | ORAL_CAPSULE | Freq: Every day | ORAL | Status: AC

## 2023-12-15 MED ORDER — ASPIRIN 81 MG PO TBEC: 81.0000 mg | DELAYED_RELEASE_TABLET | Freq: Every day | ORAL | 3 refills | Status: AC

## 2023-12-21 ENCOUNTER — Other Ambulatory Visit: Payer: Self-pay | Admitting: Family Medicine

## 2024-01-08 ENCOUNTER — Encounter: Payer: Self-pay | Admitting: Family Medicine

## 2024-01-08 ENCOUNTER — Ambulatory Visit: Admitting: Physician Assistant

## 2024-01-08 DIAGNOSIS — N184 Chronic kidney disease, stage 4 (severe): Secondary | ICD-10-CM

## 2024-01-08 NOTE — Progress Notes (Unsigned)
 Reviewed notes from renal. He is now on benazepril  40/amlodipine  10 mg Labs in Aug PTH 110,eGFR 20, Cr 3.1

## 2024-01-15 ENCOUNTER — Telehealth: Payer: Self-pay | Admitting: Pharmacist

## 2024-01-15 NOTE — Telephone Encounter (Signed)
 Patient contacted for follow-up of adherence QI'25 for lisinopril  20 mg.   Spouse picked up phone and said she will call back to schedule an appointment next Monday.  Upon further chart review, lisinopril  has been switched to amlodipine /benazapril and he filled it on 11/10.   Total time with patient call and documentation of interaction: 5 minutes.

## 2024-01-28 ENCOUNTER — Ambulatory Visit

## 2024-01-28 ENCOUNTER — Encounter (HOSPITAL_COMMUNITY): Payer: Self-pay | Admitting: General Surgery

## 2024-01-30 ENCOUNTER — Other Ambulatory Visit: Payer: Self-pay | Admitting: Family Medicine

## 2024-01-30 ENCOUNTER — Other Ambulatory Visit: Payer: Self-pay | Admitting: Physician Assistant

## 2024-03-07 ENCOUNTER — Encounter: Payer: Self-pay | Admitting: Pharmacist

## 2024-03-07 NOTE — Progress Notes (Signed)
 This patient is appearing on a report for being at risk of failing the adherence measure for cholesterol (statin) medications this calendar year.   Medication: pravastatin  Last fill date: 01/30/24 for 90 day supply  Reviewed medication indication, dosing, and goals of therapy.

## 2024-03-28 ENCOUNTER — Ambulatory Visit: Admitting: Podiatry

## 2024-03-31 ENCOUNTER — Telehealth: Payer: Self-pay

## 2024-03-31 NOTE — Telephone Encounter (Signed)
 Patient called and left a message - needs to reschedule appointment with Dr. Loreda from 03/28/24 Please call to schedule. thanks

## 2024-04-18 ENCOUNTER — Ambulatory Visit

## 2024-05-16 ENCOUNTER — Ambulatory Visit: Admitting: Physician Assistant
